# Patient Record
Sex: Female | Born: 1935 | Race: White | Hispanic: No | Smoking: Never smoker
Health system: Southern US, Community
[De-identification: ages and names within clinical notes are randomized; demographics above are authoritative.]

## PROBLEM LIST (undated history)

## (undated) DIAGNOSIS — K219 Gastro-esophageal reflux disease without esophagitis: Secondary | ICD-10-CM

## (undated) DIAGNOSIS — R531 Weakness: Secondary | ICD-10-CM

## (undated) DIAGNOSIS — F329 Major depressive disorder, single episode, unspecified: Secondary | ICD-10-CM

## (undated) DIAGNOSIS — R251 Tremor, unspecified: Secondary | ICD-10-CM

## (undated) DIAGNOSIS — F0390 Unspecified dementia without behavioral disturbance: Secondary | ICD-10-CM

## (undated) DIAGNOSIS — M1990 Unspecified osteoarthritis, unspecified site: Secondary | ICD-10-CM

## (undated) DIAGNOSIS — E039 Hypothyroidism, unspecified: Secondary | ICD-10-CM

## (undated) DIAGNOSIS — F32A Depression, unspecified: Secondary | ICD-10-CM

## (undated) DIAGNOSIS — R5383 Other fatigue: Secondary | ICD-10-CM

## (undated) DIAGNOSIS — E785 Hyperlipidemia, unspecified: Secondary | ICD-10-CM

## (undated) DIAGNOSIS — I10 Essential (primary) hypertension: Secondary | ICD-10-CM

## (undated) DIAGNOSIS — S7291XA Unspecified fracture of right femur, initial encounter for closed fracture: Secondary | ICD-10-CM

## (undated) HISTORY — PX: CHOLECYSTECTOMY: SHX55

## (undated) HISTORY — PX: THYROIDECTOMY, PARTIAL: SHX18

## (undated) HISTORY — PX: ABDOMINAL HYSTERECTOMY: SHX81

## (undated) HISTORY — PX: APPENDECTOMY: SHX54

---

## 2011-03-20 ENCOUNTER — Ambulatory Visit: Payer: Self-pay | Admitting: Cardiology

## 2012-10-21 ENCOUNTER — Emergency Department: Payer: Self-pay | Admitting: Emergency Medicine

## 2012-10-21 LAB — TSH: Thyroid Stimulating Horm: 6.01 u[IU]/mL — ABNORMAL HIGH

## 2012-10-21 LAB — COMPREHENSIVE METABOLIC PANEL
Albumin: 3.8 g/dL (ref 3.4–5.0)
Alkaline Phosphatase: 104 U/L (ref 50–136)
Anion Gap: 7 (ref 7–16)
BUN: 17 mg/dL (ref 7–18)
Calcium, Total: 8.6 mg/dL (ref 8.5–10.1)
Chloride: 104 mmol/L (ref 98–107)
Co2: 27 mmol/L (ref 21–32)
Creatinine: 0.71 mg/dL (ref 0.60–1.30)
EGFR (African American): >60
EGFR (Non-African Amer.): >60
Glucose: 78 mg/dL (ref 65–99)
Osmolality: 276 (ref 275–301)
SGOT(AST): 54 U/L — ABNORMAL HIGH (ref 15–37)
SGPT (ALT): 36 U/L (ref 12–78)

## 2012-10-21 LAB — URINALYSIS, COMPLETE
Bacteria: NONE SEEN
Bilirubin,UR: NEGATIVE
Glucose,UR: NEGATIVE mg/dL (ref 0–75)
Hyaline Cast: 3
Ketone: NEGATIVE
Leukocyte Esterase: NEGATIVE
Protein: NEGATIVE
Specific Gravity: 1.011 (ref 1.003–1.030)
WBC UR: NONE SEEN /HPF (ref 0–5)

## 2012-10-21 LAB — CBC
HCT: 43.8 % (ref 35.0–47.0)
HGB: 14.8 g/dL (ref 12.0–16.0)
MCH: 30.9 pg (ref 26.0–34.0)
Platelet: 183 10*3/uL (ref 150–440)
RDW: 15.1 % — ABNORMAL HIGH (ref 11.5–14.5)
WBC: 7.5 10*3/uL (ref 3.6–11.0)

## 2012-10-21 LAB — CK TOTAL AND CKMB (NOT AT ARMC)
CK, Total: 121 U/L (ref 21–215)
CK-MB: <0.5 ng/mL — ABNORMAL LOW (ref 0.5–3.6)

## 2013-08-11 ENCOUNTER — Observation Stay: Payer: Self-pay | Admitting: Family Medicine

## 2013-08-11 LAB — URINALYSIS, COMPLETE
BILIRUBIN, UR: NEGATIVE
Bacteria: NONE SEEN
Glucose,UR: NEGATIVE mg/dL (ref 0–75)
Hyaline Cast: 1
KETONE: NEGATIVE
Leukocyte Esterase: NEGATIVE
Nitrite: NEGATIVE
Ph: 6 (ref 4.5–8.0)
Protein: NEGATIVE
RBC,UR: 87 /HPF (ref 0–5)
SQUAMOUS EPITHELIAL: NONE SEEN
Specific Gravity: 1.014 (ref 1.003–1.030)
WBC UR: 4 /HPF (ref 0–5)

## 2013-08-11 LAB — BASIC METABOLIC PANEL
ANION GAP: 5 — AB (ref 7–16)
BUN: 16 mg/dL (ref 7–18)
CALCIUM: 8.9 mg/dL (ref 8.5–10.1)
CO2: 32 mmol/L (ref 21–32)
Chloride: 99 mmol/L (ref 98–107)
Creatinine: 1.07 mg/dL (ref 0.60–1.30)
EGFR (African American): 57 — ABNORMAL LOW
EGFR (Non-African Amer.): 49 — ABNORMAL LOW
GLUCOSE: 95 mg/dL (ref 65–99)
Osmolality: 273 (ref 275–301)
Potassium: 3.6 mmol/L (ref 3.5–5.1)
SODIUM: 136 mmol/L (ref 136–145)

## 2013-08-11 LAB — CBC
HCT: 44.7 %
HGB: 15.3 g/dL
MCH: 31.6 pg
MCHC: 34.3 g/dL
MCV: 92 fL
Platelet: 227 x10 3/mm 3
RBC: 4.84 X10 6/mm 3
RDW: 14.2 %
WBC: 7.2 x10 3/mm 3

## 2013-08-12 LAB — CBC WITH DIFFERENTIAL/PLATELET
BASOS ABS: 0.1 10*3/uL (ref 0.0–0.1)
Basophil %: 1 %
Eosinophil #: 0.2 10*3/uL (ref 0.0–0.7)
Eosinophil %: 2.5 %
HCT: 39.6 % (ref 35.0–47.0)
HGB: 13.6 g/dL (ref 12.0–16.0)
Lymphocyte #: 2 10*3/uL (ref 1.0–3.6)
Lymphocyte %: 28.2 %
MCH: 31.9 pg (ref 26.0–34.0)
MCHC: 34.3 g/dL (ref 32.0–36.0)
MCV: 93 fL (ref 80–100)
MONOS PCT: 7.5 %
Monocyte #: 0.5 x10 3/mm (ref 0.2–0.9)
NEUTROS ABS: 4.4 10*3/uL (ref 1.4–6.5)
NEUTROS PCT: 60.8 %
Platelet: 206 10*3/uL (ref 150–440)
RBC: 4.26 10*6/uL (ref 3.80–5.20)
RDW: 14.4 % (ref 11.5–14.5)
WBC: 7.2 10*3/uL (ref 3.6–11.0)

## 2013-08-12 LAB — BASIC METABOLIC PANEL
ANION GAP: 5 — AB (ref 7–16)
BUN: 13 mg/dL (ref 7–18)
Calcium, Total: 8.5 mg/dL (ref 8.5–10.1)
Chloride: 102 mmol/L (ref 98–107)
Co2: 30 mmol/L (ref 21–32)
Creatinine: 1.05 mg/dL (ref 0.60–1.30)
EGFR (Non-African Amer.): 50 — ABNORMAL LOW
GFR CALC AF AMER: 58 — AB
GLUCOSE: 87 mg/dL (ref 65–99)
Osmolality: 273 (ref 275–301)
Potassium: 3.3 mmol/L — ABNORMAL LOW (ref 3.5–5.1)
SODIUM: 137 mmol/L (ref 136–145)

## 2013-08-12 LAB — URINALYSIS, COMPLETE
BILIRUBIN, UR: NEGATIVE
Bacteria: NONE SEEN
Glucose,UR: NEGATIVE mg/dL (ref 0–75)
KETONE: NEGATIVE
NITRITE: NEGATIVE
Ph: 6 (ref 4.5–8.0)
Protein: NEGATIVE
RBC,UR: 4 /HPF (ref 0–5)
SPECIFIC GRAVITY: 1.008 (ref 1.003–1.030)
Squamous Epithelial: NONE SEEN
WBC UR: 32 /HPF (ref 0–5)

## 2013-08-12 LAB — SEDIMENTATION RATE: Erythrocyte Sed Rate: 7 mm/hr (ref 0–30)

## 2013-08-12 LAB — CK: CK, Total: 38 U/L (ref 21–215)

## 2013-08-12 LAB — LACTATE DEHYDROGENASE: LDH: 147 U/L (ref 81–246)

## 2013-08-12 LAB — TSH: Thyroid Stimulating Horm: 6.48 u[IU]/mL — ABNORMAL HIGH

## 2013-08-13 LAB — BASIC METABOLIC PANEL
ANION GAP: 3 — AB (ref 7–16)
BUN: 11 mg/dL (ref 7–18)
CHLORIDE: 104 mmol/L (ref 98–107)
CO2: 29 mmol/L (ref 21–32)
Calcium, Total: 7.7 mg/dL — ABNORMAL LOW (ref 8.5–10.1)
Creatinine: 0.89 mg/dL (ref 0.60–1.30)
EGFR (Non-African Amer.): >60
Glucose: 89 mg/dL (ref 65–99)
Osmolality: 271 (ref 275–301)
Potassium: 3.7 mmol/L (ref 3.5–5.1)
SODIUM: 136 mmol/L (ref 136–145)

## 2013-08-15 LAB — BASIC METABOLIC PANEL
ANION GAP: 4 — AB (ref 7–16)
BUN: 5 mg/dL — ABNORMAL LOW (ref 7–18)
CALCIUM: 7.8 mg/dL — AB (ref 8.5–10.1)
CO2: 27 mmol/L (ref 21–32)
Chloride: 111 mmol/L — ABNORMAL HIGH (ref 98–107)
Creatinine: 0.82 mg/dL (ref 0.60–1.30)
GLUCOSE: 84 mg/dL (ref 65–99)
OSMOLALITY: 280 (ref 275–301)
Potassium: 3.6 mmol/L (ref 3.5–5.1)
SODIUM: 142 mmol/L (ref 136–145)

## 2013-08-15 LAB — CBC WITH DIFFERENTIAL/PLATELET
BASOS ABS: 0.1 10*3/uL (ref 0.0–0.1)
Basophil %: 1 %
Eosinophil #: 0.2 10*3/uL (ref 0.0–0.7)
Eosinophil %: 3.4 %
HCT: 33.7 % — ABNORMAL LOW (ref 35.0–47.0)
HGB: 11.7 g/dL — AB (ref 12.0–16.0)
LYMPHS ABS: 1.8 10*3/uL (ref 1.0–3.6)
LYMPHS PCT: 32.7 %
MCH: 31.9 pg (ref 26.0–34.0)
MCHC: 34.7 g/dL (ref 32.0–36.0)
MCV: 92 fL (ref 80–100)
MONO ABS: 0.4 x10 3/mm (ref 0.2–0.9)
MONOS PCT: 7.7 %
Neutrophil #: 3.1 10*3/uL (ref 1.4–6.5)
Neutrophil %: 55.2 %
Platelet: 178 10*3/uL (ref 150–440)
RBC: 3.65 10*6/uL — ABNORMAL LOW (ref 3.80–5.20)
RDW: 14.5 % (ref 11.5–14.5)
WBC: 5.6 10*3/uL (ref 3.6–11.0)

## 2013-08-19 LAB — URINE CULTURE

## 2013-09-28 DIAGNOSIS — I10 Essential (primary) hypertension: Secondary | ICD-10-CM | POA: Diagnosis present

## 2013-09-28 DIAGNOSIS — M109 Gout, unspecified: Secondary | ICD-10-CM | POA: Diagnosis present

## 2013-09-28 DIAGNOSIS — E039 Hypothyroidism, unspecified: Secondary | ICD-10-CM | POA: Diagnosis present

## 2013-09-28 DIAGNOSIS — E785 Hyperlipidemia, unspecified: Secondary | ICD-10-CM | POA: Insufficient documentation

## 2014-11-03 NOTE — Discharge Summary (Signed)
PATIENT NAME:  Brandy Oconnor, Brandy Oconnor MR#:  [id number] DATE OF BIRTH:  1930-11-05  DATE OF ADMISSION:  08/11/2013 DATE OF DISCHARGE:  08/15/2013  DISCHARGE DIAGNOSES: 1.  Lower extremity weakness.  2.  Dehydration.  3.  Urinary tract infection.   DISCHARGE MEDICATIONS: 1.  Atenolol 100 mg p.o. daily.  2.  Amlodipine 5 mg p.o. daily.  3.  Alendronate 70 mg 1 tab weekly.  4.  Hydrochlorothiazide/lisinopril 12.5/20 mg p.o. daily.  5.  Pravastatin 20 mg p.o. at bedtime.  6.  Primidone 250 mg p.o. t.i.d.  7.  Levothyroxine 88 mcg p.o. daily.  8.  Ciprofloxacin 250 mg p.o. b.i.d. x 2 more days.   CONSULTS: None.   PROCEDURES: None.   PERTINENT LABS ON DAY OF DISCHARGE: Sodium 142, potassium 3.6, creatinine 0.82, glucose 84. White blood cell count 5.6, hemoglobin 11.7, platelets of 178. Urine culture is pending.   BRIEF HOSPITAL COURSE:  1.  Lower extremity weakness. The patient initially came in with complaints of lower extremity weakness with unclear source. After further evaluation, it is likely thought that this was due to deconditioning and progressive weakness. She had an MRI of the spine performed. It did show mild lumbar degenerative changes. No neural impingement or spinal stenosis. She was evaluated by physical therapy who recommended home health PT. She was able to ambulate prior to discharge. Plan to discharge home with continued physical therapy. 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 Further evaluation can be done as an outpatient.  2.  Dehydration. The patient initially came in with concerns for dehydration. She was given IV fluids and this did resolve.  3.  Urinary tract infection. The patient had a positive urinalysis upon admission. Urine culture is still pending at this time. Her initial urinalysis showed 2+ blood, 3+ leukocyte esterase and 32 white blood cells. She is on Cipro. She has 2 more days of Cipro.   DISPOSITION: She is in stable  condition to be discharged home with home health. Will follow up with Dr. Burnadette PopLinthavong within 10 days.    ____________________________ Marisue IvanKanhka Jeris Roser, MD kl:dp D: 08/15/2013 08:22:54 ET T: 08/15/2013 10:12:02 ET JOB#: [id number]  cc: Marisue IvanKanhka Lavere Shinsky, MD, <Dictator> Marisue IvanKANHKA Ogle Hoeffner MD ELECTRONICALLY SIGNED 09/01/2013 10:51

## 2014-11-03 NOTE — H&P (Signed)
PATIENT NAME:  Brandy Oconnor, Brandy Oconnor MR#:  [id number] DATE OF BIRTH:  05-Jun-1931  DATE OF ADMISSION:  08/11/2013  ADMITTING PHYSICIAN:  Enid Baas, MD  PRIMARY CARE PHYSICIAN:  Dr. Burnadette Pop   CHIEF COMPLAINT:  Weakness and fall today.   HISTORY OF PRESENT ILLNESS:  Brandy Oconnor is an 81 year old Caucasian female with past medical history significant for hypertension, hypothyroidism, central tremors, who is at [facility], was brought in after she was suffering from bilateral leg weakness and had a fall today. The patient says that she has been having weakness of her legs for almost the past year. At baseline, she does not use any careful inspection or walker to ambulate, but had been scared to walk for long distances because her gait was getting more unsteady lately. She does have central tremors with tremors of her arms and also noticed that her tremors of the lower legs getting worse lately. She recently was talking to the people at the independent living facility to see if she could get a walker. Today, she was walking and in her legs gave out and she had a fall and was presented to the Emergency Room. She is extremely weak and unable to get out of bed here, so is being admitted under observation for PT evaluation.   PAST MEDICAL HISTORY:  1.  Hypertension.  2.  Hypothyroidism.  3.  Central tremors.  4.  Osteoporosis.   PAST SURGICAL HISTORY:  1.  Hysterectomy.  2.  Cholecystectomy.  3.  Appendectomy.  4.  Subtotal thyroidectomy for goiter.   ALLERGIES:  CODEINE.    CURRENT HOME MEDICATIONS:  1.  Norvasc 5 mg p.o. daily.  2.  Atenolol 100 mg p.o. daily.  3.  Fosamax 70 mg p.o. once a week.  4.  Lisinopril/hydrochlorothiazide 20/12.5 mg 1 tablet p.o. daily.  5.  Pravastatin 20 mg p.o. daily.  6.  Primidone 250 mg p.o. 3 times a day.  7.  Synthroid 88 mcg 1 tablet p.o. daily.   SOCIAL HISTORY:  Living at [facility]. Does  not have a cane or walker at baseline but has been growing unsteady to ambulate. No history of any smoking ever. No alcohol use.  FAMILY HISTORY:  Significant for heart disease in the family.   REVIEW OF SYSTEMS:  CONSTITUTIONAL:  No fever, fatigue or weakness.  EYES:  No blurred vision, double vision, pain, inflammation or glaucoma. Uses glasses.  ENT:  No tinnitus, ear pain, hearing loss, epistaxis or discharge.  RESPIRATORY:  No cough, wheeze, hemoptysis or COPD.  CARDIOVASCULAR:  No chest pain, orthopnea, edema, arrhythmia, palpitations or syncope.  GASTROINTESTINAL:  No nausea, vomiting, diarrhea, abdominal pain, hematemesis or melena.  GENITOURINARY:  No dysuria, hematuria, renal calculus, frequency or incontinence.  ENDOCRINE:  No polyuria, nocturia, thyroid problems, heat or cold intolerance.  HEMATOLOGY:  No anemia, easy bruising or bleeding.  SKIN:  No acne, rash or lesions.  MUSCULOSKELETAL:  Positive for arthritis and osteoporosis. No joint pains currently. No gout.  NEUROLOGIC:  No numbness, weakness, CVA, TIA or seizures. Positive for ataxia and central tremors.   PSYCHOLOGICAL:  No anxiety, insomnia or depression.   PHYSICAL EXAMINATION:  VITAL SIGNS:  Temperature 98.5 degrees Fahrenheit, pulse 73, respirations 18, blood pressure 149/83, pulse ox 100% on room air.  GENERAL:  Well-built, well-nourished female lying in bed, not in any acute distress.  HEENT:  Normocephalic, atraumatic. Pupils equal, round, reacting to light. Anicteric sclerae. Extraocular movements intact.  Oropharynx clear without erythema, mass or exudates.  NECK:  Supple. No thyromegaly, JVD or carotid bruits. No lymphadenopathy.  LUNGS:  Moving air bilaterally. No wheeze or crackles. No use of accessory muscles for breathing.  CARDIOVASCULAR:  S1, S2 regular rate and rhythm, 3/6 systolic murmur heard. No rubs or gallops.  ABDOMEN:  Soft, nontender, nondistended. No hepatosplenomegaly. Normal bowel sounds.   EXTREMITIES:  No pedal edema. No clubbing or cyanosis, 2+ dorsalis pedis pulses palpable bilaterally.  SKIN:  No acne, rash or lesions.  LYMPHATICS:  No cervical or inguinal lymphadenopathy. NEUROLOGICAL:  Cranial nerves II through XII remain intact. Motor strength is 5/5 both upper extremities, and 4/5 both lower extremities. Sensation is intact. She does central tremor involving her head and also her arms and extremities noted by independent lifting of the leg off the bed.  PSYCHOLOGIC:  Awake, alert, oriented x 3.   LABORATORY DATA:  WBC 7.2, hemoglobin 15.3, hematocrit 44.7, platelet count 227.   Sodium 136, potassium 3.8, chloride 99, bicarb 32, BUN 16, creatinine 1.07, glucose 99 and calcium of 8.9.   URINALYSIS:  Negative for any infection.   CT of the head showing age-related atrophy which is mild and generalized, otherwise no acute intracranial findings noted. An EKG showing normal sinus rhythm, heart rate of 76, no acute ST-T wave abnormalities noted.   ASSESSMENT AND PLAN:  This is an elderly 81 year old female with a history of central tremors, hypertension, who is brought from independent living after she had a fall. 1.  Fall and unsteady gait, likely worsening of her central tremors. Continue primidone at the current dose. We will get Physical Therapy to see if she could get a walker and Home Health.  2.  Hypertension. Continue home medications.  3.  Hypothyroidism.  Continue Synthroid.  4.  Deep venous thrombosis prophylaxis with subcutaneous heparin.   CODE STATUS:  She is a DO NOT RESUSCITATE as she has an out-of-facility yellow DRN form signed by Dr. Burnadette PopLinthavong from 12/30/2012.    TIME SPENT ON ADMISSION:  50 minutes.   ____________________________ Enid Baasadhika Gloriana Piltz, MD rk:jm D: 08/11/2013 14:23:00 ET T: 08/11/2013 15:15:13 ET JOB#: [id number]  cc: Enid Baasadhika Asyia Hornung, MD, <Dictator> Marisue IvanKanhka Linthavong, MD Enid BaasADHIKA Lenzy Kerschner MD ELECTRONICALLY SIGNED 08/17/2013 14:58

## 2014-11-08 ENCOUNTER — Observation Stay
Admit: 2014-11-08 | Disposition: A | Discharge status: Home / Self Care | Payer: Self-pay | Attending: Internal Medicine | Admitting: Internal Medicine

## 2014-11-08 LAB — CBC WITH DIFFERENTIAL/PLATELET
BASOS ABS: 0.1 10*3/uL (ref 0.0–0.1)
Basophil %: 0.6 %
Eosinophil #: 0.7 10*3/uL (ref 0.0–0.7)
Eosinophil %: 7.4 %
HCT: 41.2 % (ref 35.0–47.0)
HGB: 13.7 g/dL (ref 12.0–16.0)
LYMPHS ABS: 3.6 10*3/uL (ref 1.0–3.6)
LYMPHS PCT: 40.3 %
MCH: 30.1 pg (ref 26.0–34.0)
MCHC: 33.2 g/dL (ref 32.0–36.0)
MCV: 91 fL (ref 80–100)
MONO ABS: 0.3 x10 3/mm (ref 0.2–0.9)
Monocyte %: 3.5 %
Neutrophil #: 4.3 10*3/uL (ref 1.4–6.5)
Neutrophil %: 48.2 %
Platelet: 221 10*3/uL (ref 150–440)
RBC: 4.54 10*6/uL (ref 3.80–5.20)
RDW: 14.5 % (ref 11.5–14.5)
WBC: 9 10*3/uL (ref 3.6–11.0)

## 2014-11-08 LAB — URINALYSIS, COMPLETE
Bacteria: NONE SEEN
Bilirubin,UR: NEGATIVE
Glucose,UR: NEGATIVE mg/dL (ref 0–75)
Ketone: NEGATIVE
Leukocyte Esterase: NEGATIVE
NITRITE: NEGATIVE
Ph: 7 (ref 4.5–8.0)
Protein: 100
Specific Gravity: 1.009 (ref 1.003–1.030)

## 2014-11-08 LAB — BASIC METABOLIC PANEL
Anion Gap: 10 (ref 7–16)
BUN: 22 mg/dL — AB
CO2: 23 mmol/L
CREATININE: 1.13 mg/dL — AB
Calcium, Total: 8.2 mg/dL — ABNORMAL LOW
Chloride: 106 mmol/L
GFR CALC AF AMER: 52 — AB
GFR CALC NON AF AMER: 45 — AB
GLUCOSE: 132 mg/dL — AB
POTASSIUM: 3.6 mmol/L
SODIUM: 139 mmol/L

## 2014-11-08 LAB — CLOSTRIDIUM DIFFICILE(ARMC)

## 2014-11-09 ENCOUNTER — Other Ambulatory Visit: Payer: Self-pay

## 2014-11-09 LAB — CBC WITH DIFFERENTIAL/PLATELET
BASOS ABS: 0 10*3/uL (ref 0.0–0.1)
BASOS PCT: 0.3 %
Eosinophil #: 0.6 10*3/uL (ref 0.0–0.7)
Eosinophil %: 7.8 %
HCT: 32.5 % — AB (ref 35.0–47.0)
HGB: 10.8 g/dL — AB (ref 12.0–16.0)
LYMPHS PCT: 20.9 %
Lymphocyte #: 1.6 10*3/uL (ref 1.0–3.6)
MCH: 30.3 pg (ref 26.0–34.0)
MCHC: 33.3 g/dL (ref 32.0–36.0)
MCV: 91 fL (ref 80–100)
Monocyte #: 0.5 x10 3/mm (ref 0.2–0.9)
Monocyte %: 5.9 %
NEUTROS ABS: 5 10*3/uL (ref 1.4–6.5)
Neutrophil %: 65.1 %
Platelet: 138 10*3/uL — ABNORMAL LOW (ref 150–440)
RBC: 3.57 10*6/uL — ABNORMAL LOW (ref 3.80–5.20)
RDW: 14.9 % — ABNORMAL HIGH (ref 11.5–14.5)
WBC: 7.7 10*3/uL (ref 3.6–11.0)

## 2014-11-09 LAB — BASIC METABOLIC PANEL
Anion Gap: 4 — ABNORMAL LOW (ref 7–16)
BUN: 10 mg/dL
Calcium, Total: 7.6 mg/dL — ABNORMAL LOW
Chloride: 116 mmol/L — ABNORMAL HIGH
Co2: 24 mmol/L
Creatinine: 0.71 mg/dL
EGFR (African American): >60
GLUCOSE: 96 mg/dL
Potassium: 3.5 mmol/L
SODIUM: 144 mmol/L

## 2014-11-09 LAB — TSH: THYROID STIMULATING HORM: 1.638 u[IU]/mL

## 2014-11-11 NOTE — H&P (Signed)
PATIENT NAME:  Brandy Oconnor, ROMA MR#:  [id number] DATE OF BIRTH:  [birth year]  DATE OF ADMISSION:  11/08/2014  CHIEF COMPLAINT: Bradycardia.   HISTORY OF PRESENT ILLNESS: This is an 81 year old female who came to the ED after a syncopal episode. She states that she began to have nausea and some diarrhea about a week ago. The diarrhea was pretty persistent, every day, several times a day and sometimes it was very watery. She states that today she went into the bathroom, was using the bathroom, and then got really sweaty and had an episode of light headedness. She was able to get down to the floor and then afterwards had a syncopal episode while on the floor. When she came to, she was able to press her Life Alert button to call EMS to bring her into the ED. She stated that she was able to get on the floor before she syncopized; there was no fall. The patient was recently on some antibiotics for a sinus infection. She finished them about a week ago. Since she has been having this nausea she had decreased p.o. intake. The patient denies any overt fevers or chills. She has had diffuse abdominal tenderness. Denies any other significant infectious symptoms. Denies any urinary symptoms. In the ED, the patient was found to be dehydrated with low blood pressure. She was given fluid bolus and her blood pressure responded pretty well. Hospitalists were then called for admission for AKI and suspected Clostridium difficile infection.   PRIMARY CARE PHYSICIAN: Marisue Ivan, MD   PAST MEDICAL HISTORY: Hypothyroidism postsurgical tremor, hypertension, hyperlipidemia.   MEDICATIONS: Vitamin D; pravastatin 20 mg daily; Mysoline 500 mg in the morning and in the evening, 250 mg in the afternoon; Claritin; lisinopril 10 mg daily; Synthroid 100 mg daily; atenolol 50 mg daily.   PAST SURGICAL HISTORY: Includes cholecystectomy, appendectomy, hysterectomy, and thyroid resection for a tumor.  ALLERGIES: CODEINE.   FAMILY  HISTORY: Hypertension, CAD, diabetes, cancer.   SOCIAL HISTORY: Nonsmoker, nondrinker. Denies illicit drug use.   REVIEW OF SYSTEMS:  CONSTITUTIONAL: Denies fever. Endorses some fatigue and weakness.  EYES: Denies blurred or double vision, pain or redness.  EAR, NOSE, AND THROAT: Denies ear pain, hearing loss, difficulty swallowing.  RESPIRATORY: Denies cough, dyspnea, painful respiration.  CARDIOVASCULAR: Denies chest pain, edema, or palpitations. She endorses a syncopal episode as per HPI.  GASTROINTESTINAL: Endorses nausea with diarrhea. No abdominal pain. Denies vomiting. Denies constipation.  GENITOURINARY: Denies dysuria, hematuria, or frequency.  ENDOCRINE: Denies nocturia. Endorses chronic stable thyroid problems. Denies heat or cold intolerance.  HEMATOLOGIC AND LYMPHATIC: Denies easy bruising, bleeding, swollen glands.  INTEGUMENTARY: Denies acne, rash, or lesion.  MUSCULOSKELETAL: Denies arthritis, joint swelling, or gout.  NEUROLOGICAL: Denies numbness, weakness, headache.  PSYCHIATRIC: Denies anxiety, insomnia, or depression.   PHYSICAL EXAMINATION:  VITAL SIGNS: Blood pressure 95/62, pulse 63, temperature 97.5, respirations 20 with 100% oxygen saturation on 2 L of oxygen.  GENERAL: This is an elderly-appearing female lying in bed in no acute distress.  HEENT: Pupils equal, round, and react to light and accommodation. Extraocular movements intact. No scleral icterus. Dry mucosal membranes.  NECK: Thyroid is not enlarged. Neck is supple. No masses, nontender. No cervical adenopathy. No JVD.  RESPIRATORY: Clear to auscultation bilaterally with no rales, rhonchi, or wheezing. No respiratory distress.  CARDIOVASCULAR: Regular rate and rhythm with no murmurs, rubs, or gallops. Good pedal pulses. No lower extremity edema.  ABDOMEN: Soft. Diffuse mild tenderness with bowel sounds present and no distention.  MUSCULOSKELETAL: Muscular strength 5/5 in all 4 extremities. Full spontaneous  range of motion throughout. No cyanosis or clubbing.  SKIN: No rash, lesions. Skin is warm, dry, and intact.  LYMPHATIC: No adenopathy.  NEUROLOGICAL: Cranial nerves intact. Sensation intact throughout. No dysarthria or aphasia.  PSYCHIATRIC: Alert and oriented x 3, cooperative, not confused or agitated.   LABORATORY DATA: White count 9, hemoglobin 13.7, hematocrit 41.2, platelets 221,000. Sodium 139, potassium 3.6, chloride 106, bicarbonate 23, BUN 22, creatinine 1.13, glucose 132, calcium 8.2. Urinalysis is negative.   RADIOLOGIC DATA: No current radiographic imaging to report for this admission.   ASSESSMENT AND PLAN:  1.  Syncope: This is likely due to her significant dehydration and her low blood pressure from the same due to her diarrhea. We will treat those problems as listed below and continue to monitor her on telemetry while here.  2.  Diarrhea: Question as to whether or not this is a Clostridium difficile infection. She does white count. She does have a little bit of acute kidney injury, which could be due to her dehydration. We have ordered to check for Clostridium difficile. We will hold her Mysoline for right now as well just because that can cause nausea and vomiting as a side effect.  3.  Acute kidney injury: This is likely due to her dehydration from her diarrhea. We will give her fluids for resuscitation. Her blood pressure is responding well to this. We will monitor her creatinine.  4.  Hypertension: We will hold her home antihypertensives at this time, as she has borderline low blood pressure.  5.  Hypothyroidism: Continue her home dose of Synthroid.  6.  Deep vein thrombosis prophylaxis: Subcutaneous heparin.   CODE STATUS: This patient is full code. Per her report, she has a DO NOT RESUSCITATE at home; however, she stated to this writer tonight that she wanted to be resuscitated if there was "a chance to revive me." This code status may need to be further explored with her  primary care physician once she is discharged from the hospital.   TIME SPENT ON THIS ADMISSION: 45 minutes.   ____________________________ Candace Cruiseavid F. Anne HahnWillis, MD dfw:bm D: 11/08/2014 04:01:11 ET T: 11/08/2014 06:33:45 ET JOB#: [id number]  cc: Candace Cruiseavid F. Anne HahnWillis, MD, <Dictator> London Tarnowski Scotty CourtF Jeter Tomey MD ELECTRONICALLY SIGNED 11/08/2014 21:03

## 2014-11-11 NOTE — Discharge Summary (Signed)
PATIENT NAME:  Brandy Oconnor, Brandy Oconnor MR#:  [id number] DATE OF BIRTH:  08-12-1930  DATE OF ADMISSION:  11/08/2014 DATE OF DISCHARGE:  11/09/2014   DISCHARGE DIAGNOSES: 1. Acute gastroenteritis.  2. Acute and renal failure with dehydration.  3. Hypertension.  4. Hypothyroidism.   CONDITION: Stable.   CODE STATUS: Full code.   HOME MEDICATIONS: Please refer to the medication reconciliation list.    DIET: Low-sodium diet.   ACTIVITY: As tolerated.   FOLLOWUP CARE: Follow with PCP within 1-2 weeks. The patient needs to resume home health with [facility].   REASON FOR ADMISSION: Bradycardia.   HOSPITAL COURSE:  1. The patient is an 81 year old Caucasian female with a history of hypothyroidism, tremor, and hypertension, who was sent to the Emergency Department due to nausea and diarrhea for 1 week. The patient's diarrhea was persistent, several times a day. In addition, the patient had 1 episode of lightheadedness. For a detailed history and physical examination, please refer to the admission note dictated by Dr. Anne HahnWillis. On admission date, the patient's BUN 22, creatinine 1.13, glucose 132. Urinalysis is negative. WBC 9, hemoglobin 13.7. The patient was admitted for syncope, possibly due to dehydration and low blood pressure due to diarrhea.  2. Acute gastroenteritis. The patient has been treated with IV fluid support Zofran p.r.n. Stool Clostridium difficile test is negative. The patient's diarrhea resolved. She has no nausea or vomiting.  3. Acute renal failure with dehydration. As mentioned above, the patient received IV fluid support. Dehydration and renal failure improved.  4. Hypertension. The patient's blood pressure was low. Lisinopril and atenolol were discontinued. The patient's blood pressure is stable. The patient may resume lisinopril and atenolol after discharge.  5. The patient has no complaints. Vital signs are stable, physical examination is unremarkable. She will be discharged to home and  resume the [facility] home health. I discussed the patient's discharge plan with the patient, nurse, and the case manager.   TIME SPENT: About 36 minutes     ____________________________ Shaune PollackQing Jacqueli Pangallo, MD qc:mw D: 11/09/2014 11:16:28 ET T: 11/09/2014 11:30:02 ET JOB#: [id number]  cc: Shaune PollackQing Khaza Blansett, MD, <Dictator> Shaune PollackQING Anacristina Steffek MD ELECTRONICALLY SIGNED 11/09/2014 13:36

## 2015-05-08 ENCOUNTER — Encounter: Payer: Self-pay | Admitting: *Deleted

## 2015-05-08 ENCOUNTER — Emergency Department
Admission: EM | Admit: 2015-05-08 | Discharge: 2015-05-08 | Disposition: A | Discharge status: Home / Self Care | Payer: Medicare (Managed Care) | Attending: Emergency Medicine | Admitting: Emergency Medicine

## 2015-05-08 DIAGNOSIS — W4904XA Ring or other jewelry causing external constriction, initial encounter: Secondary | ICD-10-CM | POA: Insufficient documentation

## 2015-05-08 DIAGNOSIS — I10 Essential (primary) hypertension: Secondary | ICD-10-CM | POA: Diagnosis not present

## 2015-05-08 DIAGNOSIS — Y9389 Activity, other specified: Secondary | ICD-10-CM | POA: Diagnosis not present

## 2015-05-08 DIAGNOSIS — Z79899 Other long term (current) drug therapy: Secondary | ICD-10-CM | POA: Diagnosis not present

## 2015-05-08 DIAGNOSIS — S6991XA Unspecified injury of right wrist, hand and finger(s), initial encounter: Secondary | ICD-10-CM | POA: Diagnosis not present

## 2015-05-08 DIAGNOSIS — Y998 Other external cause status: Secondary | ICD-10-CM | POA: Diagnosis not present

## 2015-05-08 DIAGNOSIS — Y9289 Other specified places as the place of occurrence of the external cause: Secondary | ICD-10-CM | POA: Insufficient documentation

## 2015-05-08 HISTORY — DX: Essential (primary) hypertension: I10

## 2015-05-08 HISTORY — DX: Gastro-esophageal reflux disease without esophagitis: K21.9

## 2015-05-08 HISTORY — DX: Hypothyroidism, unspecified: E03.9

## 2015-05-08 HISTORY — DX: Unspecified dementia, unspecified severity, without behavioral disturbance, psychotic disturbance, mood disturbance, and anxiety: F03.90

## 2015-05-08 HISTORY — DX: Unspecified osteoarthritis, unspecified site: M19.90

## 2015-05-08 HISTORY — DX: Major depressive disorder, single episode, unspecified: F32.9

## 2015-05-08 HISTORY — DX: Depression, unspecified: F32.A

## 2015-05-08 NOTE — ED Notes (Signed)
Pt here requesting a ring be cut off of her finger.  Finger swollen and painful.

## 2015-05-08 NOTE — Discharge Instructions (Signed)
Return if condition worsen.

## 2015-05-08 NOTE — ED Provider Notes (Signed)
[facility] Emergency Department Provider Note ____________________________________________  Time seen: Approximately 1:37 PM  I have reviewed the triage vital signs and the nursing notes.   HISTORY  Chief Complaint Hand Pain  HPI Brandy Oconnor is a 81 y.o. female who presents requesting that she have her a ring cut off her R ring finger. States her finger is now causing her pain and her knuckle is swollen.  Past Medical History  Diagnosis Date  . Dementia   . Depression   . Osteoarthritis   . Hypothyroidism   . Hypertension   . GERD (gastroesophageal reflux disease)     There are no active problems to display for this patient.   History reviewed. No pertinent past surgical history.  Current Outpatient Rx  Name  Route  Sig  Dispense  Refill  . atenolol (TENORMIN) 50 MG tablet   Oral   Take 50 mg by mouth daily.         . Levothyroxine Sodium 100 MCG CAPS   Oral   Take 100 mcg by mouth daily.         Marland Kitchen lisinopril (PRINIVIL,ZESTRIL) 10 MG tablet   Oral   Take 10 mg by mouth daily.         Marland Kitchen loratadine (CLARITIN) 10 MG tablet   Oral   Take 10 mg by mouth daily.         . pravastatin (PRAVACHOL) 20 MG tablet   Oral   Take 20 mg by mouth at bedtime.         . primidone (MYSOLINE) 250 MG tablet   Oral   Take 250 mg by mouth 3 (three) times daily. At 8am, 1pm, and bedtime         . Vitamin D, Ergocalciferol, (DRISDOL) 50000 UNITS CAPS capsule   Oral   Take 50,000 Units by mouth every 30 (thirty) days. Once a month on the first Monday           Allergies Codeine  No family history on file.  Social History Social History  Substance Use Topics  . Smoking status: Never Smoker   . Smokeless tobacco: None  . Alcohol Use: No    Review of Systems Musculoskeletal: R ring finger pain with swelling of knuckle.  Skin: Negative for bruising around rings.  10-point ROS otherwise  negative.  ____________________________________________   PHYSICAL EXAM:  VITAL SIGNS: ED Triage Vitals  Enc Vitals Group     BP 05/08/15 1239 157/79 mmHg     Pulse Rate 05/08/15 1239 63     Resp 05/08/15 1239 18     Temp 05/08/15 1239 98.2 F (36.8 C)     Temp Source 05/08/15 1239 Oral     SpO2 05/08/15 1239 95 %     Weight --      Height --      Head Cir --      Peak Flow --      Pain Score 05/08/15 1230 2     Pain Loc --      Pain Edu? --      Excl. in GC? --    Constitutional: Alert and oriented. Well appearing and in no acute distress. Eyes: Conjunctivae are normal. Head: Atraumatic. Mouth/Throat: Mucous membranes are moist.  Cardiovascular: Normal rate, regular rhythm. Good peripheral circulation. Respiratory: Normal respiratory effort.  No retractions.  Musculoskeletal: Swelling of PIP joint in R ringer finger. Mild underlying flushing of the skin. No ecchymosis or bleeding from  area.  Neurologic: Baseline head and extremity tremors. Normal speech and language. Skin:  Skin is warm, dry and intact. No rash noted. Psychiatric: Mood and affect are normal. Speech and behavior are baseline  PROCEDURES  Procedure(s) performed: Removal of two rings on R ring finger. one using manual ring cutter and the other was removed using lubricant and sliding it over PIP joint.   Critical Care performed: No  ____________________________________________   INITIAL IMPRESSION / ASSESSMENT AND PLAN / ED COURSE  Pertinent labs & imaging results that were available during my care of the patient were reviewed by me and considered in my medical decision making (see chart for details).  81 yo F who presents requesting to have a ring on her R ring finger removed. Significant swelling of the PIP joint was noted with no ecchymosis. The first ring was removed using the manual ring cutter. After removal inspection revealed to small abrasions of the skin at the PIP joint and significant  swelling of the joint. There was concern that if the other ring was left on the finger it would continue to swell and have to be cut off. With patient permission the second ring was then removed using lubricant and sliding it over the PIP joint. The finger was then re-inspected with no further damage noted and pt was neurovascularly intact. Return precautions provided and pt and her daughter verbalized understanding of them.  ____________________________________________   FINAL CLINICAL IMPRESSION(S) / ED DIAGNOSES  Final diagnoses:  Ring or other jewelry causing external constriction, initial encounter      Joni ReiningRonald K Smith, PA-C 05/08/15 1354  Jeanmarie PlantJames A McShane, MD 05/08/15 [phone]

## 2015-05-23 ENCOUNTER — Inpatient Hospital Stay
Admission: EM | Admit: 2015-05-23 | Discharge: 2015-05-27 | DRG: 470 | Disposition: A | Discharge status: Skilled Nursing Facility | Payer: Medicare (Managed Care) | Attending: Internal Medicine | Admitting: Internal Medicine

## 2015-05-23 ENCOUNTER — Emergency Department: Payer: Medicare (Managed Care)

## 2015-05-23 DIAGNOSIS — Y92009 Unspecified place in unspecified non-institutional (private) residence as the place of occurrence of the external cause: Secondary | ICD-10-CM | POA: Diagnosis not present

## 2015-05-23 DIAGNOSIS — K219 Gastro-esophageal reflux disease without esophagitis: Secondary | ICD-10-CM | POA: Diagnosis present

## 2015-05-23 DIAGNOSIS — Z833 Family history of diabetes mellitus: Secondary | ICD-10-CM

## 2015-05-23 DIAGNOSIS — W010XXA Fall on same level from slipping, tripping and stumbling without subsequent striking against object, initial encounter: Secondary | ICD-10-CM | POA: Diagnosis present

## 2015-05-23 DIAGNOSIS — E785 Hyperlipidemia, unspecified: Secondary | ICD-10-CM | POA: Diagnosis present

## 2015-05-23 DIAGNOSIS — E039 Hypothyroidism, unspecified: Secondary | ICD-10-CM | POA: Diagnosis present

## 2015-05-23 DIAGNOSIS — I10 Essential (primary) hypertension: Secondary | ICD-10-CM | POA: Diagnosis present

## 2015-05-23 DIAGNOSIS — S72001A Fracture of unspecified part of neck of right femur, initial encounter for closed fracture: Secondary | ICD-10-CM | POA: Diagnosis present

## 2015-05-23 DIAGNOSIS — Z885 Allergy status to narcotic agent status: Secondary | ICD-10-CM | POA: Diagnosis not present

## 2015-05-23 DIAGNOSIS — G250 Essential tremor: Secondary | ICD-10-CM | POA: Diagnosis present

## 2015-05-23 DIAGNOSIS — Z419 Encounter for procedure for purposes other than remedying health state, unspecified: Secondary | ICD-10-CM

## 2015-05-23 DIAGNOSIS — M1611 Unilateral primary osteoarthritis, right hip: Secondary | ICD-10-CM | POA: Diagnosis present

## 2015-05-23 DIAGNOSIS — Z79899 Other long term (current) drug therapy: Secondary | ICD-10-CM | POA: Diagnosis not present

## 2015-05-23 DIAGNOSIS — G3183 Dementia with Lewy bodies: Secondary | ICD-10-CM | POA: Diagnosis present

## 2015-05-23 DIAGNOSIS — N390 Urinary tract infection, site not specified: Secondary | ICD-10-CM | POA: Diagnosis present

## 2015-05-23 DIAGNOSIS — G8918 Other acute postprocedural pain: Secondary | ICD-10-CM

## 2015-05-23 DIAGNOSIS — S72009A Fracture of unspecified part of neck of unspecified femur, initial encounter for closed fracture: Secondary | ICD-10-CM | POA: Diagnosis present

## 2015-05-23 DIAGNOSIS — R509 Fever, unspecified: Secondary | ICD-10-CM

## 2015-05-23 HISTORY — DX: Hyperlipidemia, unspecified: E78.5

## 2015-05-23 HISTORY — DX: Tremor, unspecified: R25.1

## 2015-05-23 HISTORY — DX: Unspecified fracture of right femur, initial encounter for closed fracture: S72.91XA

## 2015-05-23 LAB — TYPE AND SCREEN
ABO/RH(D): A NEG
Antibody Screen: NEGATIVE

## 2015-05-23 LAB — URINALYSIS COMPLETE WITH MICROSCOPIC (ARMC ONLY)
Bilirubin Urine: NEGATIVE
Glucose, UA: NEGATIVE mg/dL
KETONES UR: NEGATIVE mg/dL
NITRITE: NEGATIVE
PH: 6 (ref 5.0–8.0)
Protein, ur: 100 mg/dL — AB
SPECIFIC GRAVITY, URINE: 1.015 (ref 1.005–1.030)
Squamous Epithelial / LPF: NONE SEEN

## 2015-05-23 LAB — CBC WITH DIFFERENTIAL/PLATELET
Basophils Absolute: 0 10*3/uL (ref 0–0.1)
Basophils Relative: 0 %
EOS PCT: 2 %
Eosinophils Absolute: 0.2 10*3/uL (ref 0–0.7)
HCT: 44.2 % (ref 35.0–47.0)
Hemoglobin: 15.1 g/dL (ref 12.0–16.0)
LYMPHS ABS: 2.1 10*3/uL (ref 1.0–3.6)
LYMPHS PCT: 20 %
MCH: 33 pg (ref 26.0–34.0)
MCHC: 34.2 g/dL (ref 32.0–36.0)
MCV: 96.7 fL (ref 80.0–100.0)
MONO ABS: 0.7 10*3/uL (ref 0.2–0.9)
Monocytes Relative: 7 %
NEUTROS ABS: 7.3 10*3/uL — AB (ref 1.4–6.5)
Neutrophils Relative %: 71 %
Platelets: 223 10*3/uL (ref 150–440)
RBC: 4.58 MIL/uL (ref 3.80–5.20)
RDW: 14.4 % (ref 11.5–14.5)
WBC: 10.3 10*3/uL (ref 3.6–11.0)

## 2015-05-23 LAB — COMPREHENSIVE METABOLIC PANEL
ALBUMIN: 3.9 g/dL (ref 3.5–5.0)
ALT: 19 U/L (ref 14–54)
AST: 19 U/L (ref 15–41)
Alkaline Phosphatase: 80 U/L (ref 38–126)
Anion gap: 9 (ref 5–15)
BILIRUBIN TOTAL: 0.2 mg/dL — AB (ref 0.3–1.2)
BUN: 14 mg/dL (ref 6–20)
CHLORIDE: 100 mmol/L — AB (ref 101–111)
CO2: 27 mmol/L (ref 22–32)
Calcium: 9.1 mg/dL (ref 8.9–10.3)
Creatinine, Ser: 0.83 mg/dL (ref 0.44–1.00)
GFR calc Af Amer: >60 mL/min (ref >60)
GFR calc non Af Amer: >60 mL/min (ref >60)
GLUCOSE: 104 mg/dL — AB (ref 65–99)
Potassium: 4.2 mmol/L (ref 3.5–5.1)
SODIUM: 136 mmol/L (ref 135–145)
Total Protein: 7.6 g/dL (ref 6.5–8.1)

## 2015-05-23 LAB — PROTIME-INR
INR: 1.03
Prothrombin Time: 13.7 seconds (ref 11.4–15.0)

## 2015-05-23 LAB — ABO/RH: ABO/RH(D): A NEG

## 2015-05-23 LAB — TSH: TSH: 3.248 u[IU]/mL (ref 0.350–4.500)

## 2015-05-23 MED ORDER — ATENOLOL 50 MG PO TABS
50.0000 mg | ORAL_TABLET | Freq: Every day | ORAL | Status: DC
  Administered 2015-05-23 – 2015-05-27 (×5): 50 mg via ORAL
  Filled 2015-05-23 (×5): qty 1

## 2015-05-23 MED ORDER — SODIUM CHLORIDE 0.9 % IV SOLN
INTRAVENOUS | Status: DC
  Administered 2015-05-23 – 2015-05-24 (×3): via INTRAVENOUS

## 2015-05-23 MED ORDER — MORPHINE SULFATE (PF) 2 MG/ML IV SOLN: 2.0000 mg | INTRAVENOUS | Status: DC | PRN

## 2015-05-23 MED ORDER — ACETAMINOPHEN 650 MG RE SUPP: 650.0000 mg | Freq: Four times a day (QID) | RECTAL | Status: DC | PRN

## 2015-05-23 MED ORDER — SODIUM CHLORIDE 0.9 % IJ SOLN: 3.0000 mL | Freq: Two times a day (BID) | INTRAMUSCULAR | Status: DC

## 2015-05-23 MED ORDER — ONDANSETRON HCL 4 MG/2ML IJ SOLN
4.0000 mg | Freq: Once | INTRAMUSCULAR | Status: AC
  Administered 2015-05-23: 4 mg via INTRAVENOUS
  Filled 2015-05-23: qty 2

## 2015-05-23 MED ORDER — ONDANSETRON HCL 4 MG PO TABS: 4.0000 mg | ORAL_TABLET | Freq: Four times a day (QID) | ORAL | Status: DC | PRN

## 2015-05-23 MED ORDER — LORATADINE 10 MG PO TABS
10.0000 mg | ORAL_TABLET | Freq: Every day | ORAL | Status: DC
  Administered 2015-05-23 – 2015-05-27 (×4): 10 mg via ORAL
  Filled 2015-05-23 (×4): qty 1

## 2015-05-23 MED ORDER — VITAMIN D (ERGOCALCIFEROL) 1.25 MG (50000 UNIT) PO CAPS: 50000.0000 [IU] | ORAL_CAPSULE | ORAL | Status: DC

## 2015-05-23 MED ORDER — ACETAMINOPHEN 325 MG PO TABS
650.0000 mg | ORAL_TABLET | Freq: Four times a day (QID) | ORAL | Status: DC | PRN
  Administered 2015-05-25 – 2015-05-26 (×2): 650 mg via ORAL
  Filled 2015-05-23 (×2): qty 2

## 2015-05-23 MED ORDER — BISACODYL 10 MG RE SUPP
10.0000 mg | Freq: Every day | RECTAL | Status: DC | PRN
  Administered 2015-05-25: 10 mg via RECTAL
  Filled 2015-05-23: qty 1

## 2015-05-23 MED ORDER — CEFAZOLIN (ANCEF) 1 G IV SOLR
1.0000 g | INTRAVENOUS | Status: DC
  Filled 2015-05-23: qty 1

## 2015-05-23 MED ORDER — TRAMADOL HCL 50 MG PO TABS
50.0000 mg | ORAL_TABLET | Freq: Four times a day (QID) | ORAL | Status: DC | PRN
  Administered 2015-05-25 – 2015-05-26 (×2): 50 mg via ORAL
  Filled 2015-05-23 (×2): qty 1

## 2015-05-23 MED ORDER — ONDANSETRON HCL 4 MG/2ML IJ SOLN: 4.0000 mg | Freq: Four times a day (QID) | INTRAMUSCULAR | Status: DC | PRN

## 2015-05-23 MED ORDER — LISINOPRIL 10 MG PO TABS
10.0000 mg | ORAL_TABLET | Freq: Every day | ORAL | Status: DC
  Administered 2015-05-23 – 2015-05-27 (×4): 10 mg via ORAL
  Filled 2015-05-23 (×5): qty 1

## 2015-05-23 MED ORDER — DOCUSATE SODIUM 100 MG PO CAPS
100.0000 mg | ORAL_CAPSULE | Freq: Two times a day (BID) | ORAL | Status: DC
  Administered 2015-05-23 – 2015-05-27 (×7): 100 mg via ORAL
  Filled 2015-05-23 (×7): qty 1

## 2015-05-23 MED ORDER — PRAVASTATIN SODIUM 20 MG PO TABS
20.0000 mg | ORAL_TABLET | Freq: Every day | ORAL | Status: DC
  Administered 2015-05-23 – 2015-05-26 (×4): 20 mg via ORAL
  Filled 2015-05-23 (×4): qty 1

## 2015-05-23 MED ORDER — LEVOTHYROXINE SODIUM 100 MCG PO TABS
100.0000 ug | ORAL_TABLET | Freq: Every day | ORAL | Status: DC
  Administered 2015-05-25 – 2015-05-27 (×3): 100 ug via ORAL
  Filled 2015-05-23 (×3): qty 1

## 2015-05-23 MED ORDER — MORPHINE SULFATE (PF) 4 MG/ML IV SOLN
4.0000 mg | Freq: Once | INTRAVENOUS | Status: AC
  Administered 2015-05-23: 4 mg via INTRAVENOUS
  Filled 2015-05-23: qty 1

## 2015-05-23 MED ORDER — POLYETHYLENE GLYCOL 3350 17 G PO PACK: 17.0000 g | PACK | Freq: Every day | ORAL | Status: DC | PRN

## 2015-05-23 MED ORDER — PRIMIDONE 250 MG PO TABS
250.0000 mg | ORAL_TABLET | Freq: Three times a day (TID) | ORAL | Status: DC
  Administered 2015-05-23 – 2015-05-27 (×8): 250 mg via ORAL
  Filled 2015-05-23: qty 1
  Filled 2015-05-23: qty 5
  Filled 2015-05-23 (×4): qty 1
  Filled 2015-05-23 (×2): qty 5
  Filled 2015-05-23: qty 1

## 2015-05-23 NOTE — ED Notes (Signed)
According to EMS, pt fell on Tuesday and [facility]. Femoral Fracture was confirmed upon X-Ray.

## 2015-05-23 NOTE — Consult Note (Signed)
Patient comes in after a fall 2 days ago. She has been having shortening and external rotation of the right leg and had portable x-ray that showed a femoral neck fracture. She is brought to the emergency room and follow-up x-ray was taken and that fragment x-ray shows a displaced femoral neck fracture and she is being admitted for treatment of this. She has a history of dementia and benign tremors. She's been a minimal ambulator with walker. She is seen with her daughter present who is her historian  On examination the right leg is shortened and externally rotated with palpable dorsalis pedis and posterior tibial pulse. There is mild swelling to the thigh skin is intact without ecchymosis. She is able flex extend her toes to request.  Radiographs: AP pelvis lateral right hip show displaced femoral neck fracture with comminution and moderate osteoarthritis.  Impression right femoral neck fracture displaced with osteoarthritis  Plan: Right anterior total hip replacement. Risks benefits possible competitions were discussed with her daughter who is a Engineer, civil (consulting)nurse. Then on surgery tomorrow if there is no medical problem

## 2015-05-23 NOTE — Progress Notes (Signed)
Patient arrived to the unit from ED. Patient alert to self X1. Patient unable to complete screenings due to AMS.  Patient denies pain resting between care. Cardiac monitoring placed. Spo2 placed 2L. IV fluids infusing. Call bell within reach.

## 2015-05-23 NOTE — ED Provider Notes (Addendum)
[facility] Emergency Department Provider Note  ____________________________________________  Time seen: Approximately 5:04 PM  I have reviewed the triage vital signs and the nursing notes.   HISTORY  Chief Complaint Leg Injury    HPI Geneal Huebert is a 81 y.o. female patient fell Tuesday no other injury did not want to go to the hospital refused EMS transport is been laying since then. Planes of pain in the right hip. X-rays done by mobile unit today show a femoral neck fracture.   Past Medical History  Diagnosis Date  . Dementia   . Depression   . Osteoarthritis   . Hypothyroidism   . Hypertension   . GERD (gastroesophageal reflux disease)   . Femur fracture, right (HCC)     There are no active problems to display for this patient.   History reviewed. No pertinent past surgical history.  Current Outpatient Rx  Name  Route  Sig  Dispense  Refill  . atenolol (TENORMIN) 50 MG tablet   Oral   Take 50 mg by mouth daily.         . Levothyroxine Sodium 100 MCG CAPS   Oral   Take 100 mcg by mouth daily.         Marland Kitchen lisinopril (PRINIVIL,ZESTRIL) 10 MG tablet   Oral   Take 10 mg by mouth daily.         Marland Kitchen loratadine (CLARITIN) 10 MG tablet   Oral   Take 10 mg by mouth daily.         . pravastatin (PRAVACHOL) 20 MG tablet   Oral   Take 20 mg by mouth at bedtime.         . primidone (MYSOLINE) 250 MG tablet   Oral   Take 250 mg by mouth 3 (three) times daily. At 8am, 1pm, and bedtime         . Vitamin D, Ergocalciferol, (DRISDOL) 50000 UNITS CAPS capsule   Oral   Take 50,000 Units by mouth every 30 (thirty) days. Once a month on the first Monday           Allergies Codeine  History reviewed. No pertinent family history.  Social History Social History  Substance Use Topics  . Smoking status: Never Smoker   . Smokeless tobacco: None  . Alcohol Use: No    Review of Systems Constitutional: No fever/chills Eyes: No  visual changes. ENT: No sore throat. Cardiovascular: Denies chest pain. Respiratory: Denies shortness of breath. Gastrointestinal: No abdominal pain.  No nausea, no vomiting.  No diarrhea.  No constipation. Genitourinary: Negative for dysuria. Musculoskeletal: Negative for back pain. Skin: Negative for rash. Neurological: Negative for headaches, focal weakness or numbness.  10-point ROS otherwise negative.  ____________________________________________   PHYSICAL EXAM:  VITAL SIGNS: ED Triage Vitals  Enc Vitals Group     BP --      Pulse --      Resp --      Temp --      Temp src --      SpO2 --      Weight --      Height --      Head Cir --      Peak Flow --      Pain Score --      Pain Loc --      Pain Edu? --      Excl. in GC? --     Constitutional: Alert and oriented. Well appearing and  in no acute distress if laying still. Eyes: Conjunctivae are normal. PERRL. EOMI. Head: Atraumatic. Nose: No congestion/rhinnorhea. Mouth/Throat: Mucous membranes are moist.  Oropharynx non-erythematous. Neck: No stridor No cervical spine tenderness to palpation Cardiovascular: Normal rate, regular rhythm. Grossly normal heart sounds.  Good peripheral circulation. Respiratory: Normal respiratory effort.  No retractions. Lungs CTAB. Gastrointestinal: Soft and nontender. No distention. No abdominal bruits. No CVA tenderness. Musculoskeletal: Right leg shortened and internally rotated sensation and pulse in the foot is intact on both sides normal capillary refill  No joint effusions. Neurologic:  Normal speech and language. No gross focal neurologic deficits are appreciated. . Skin:  Skin is warm, dry and intact. No rash noted.   ____________________________________________   LABS (all labs ordered are listed, but only abnormal results are displayed)  Labs Reviewed  COMPREHENSIVE METABOLIC PANEL  CBC WITH DIFFERENTIAL/PLATELET  PROTIME-INR  URINALYSIS COMPLETEWITH  MICROSCOPIC ([facility] ONLY)  TYPE AND SCREEN   ____________________________________________  EKG   ____________________________________________  RADIOLOGY   ____________________________________________   PROCEDURES  ____________________________________________   INITIAL IMPRESSION / ASSESSMENT AND PLAN / ED COURSE  Pertinent labs & imaging results that were available during my care of the patient were reviewed by me and considered in my medical decision making (see chart for details).   ____________________________________________   FINAL CLINICAL IMPRESSION(S) / ED DIAGNOSES  Final diagnoses:  Hip fracture, right, closed, initial encounter (HCC)      Arnaldo NatalPaul F Yolandra Habig, MD 05/23/15 1731  EKG read and interpreted by me shows normal sinus rhythm at a rate of 91 normal axis nonspecific ST-T wave changes no acute changes  Arnaldo NatalPaul F Efrem Pitstick, MD 05/23/15 1909

## 2015-05-23 NOTE — H&P (Signed)
[facility] [place] at [facility]   PATIENT NAME: Brandy Oconnor    MR#:  [id number]  DATE OF BIRTH:  10-06-1930  DATE OF ADMISSION:  05/23/2015  PRIMARY CARE PHYSICIAN: [facility]  REQUESTING/REFERRING PHYSICIAN: Dr. Dorothea Glassman  CHIEF COMPLAINT:   Chief Complaint  Patient presents with  . Leg Injury    Right Femur Fracutre    HISTORY OF PRESENT ILLNESS:  Brandy Oconnor  is a 81 y.o. female with a known history of benign essential tremors of her voice and head, dementia, hypertension hyperlipidemia presents to the hospital from home secondary to fall and right hip pain. Patient is a poor historian, she also has tremors of her voice and slow to respond. Also has underlying dementia. Daughter at bedside provides some history but daughter lives in [place] and she has just been with her mother for the last 2 weeks only. Most of the history is obtained from old records. Patient has been moved to [facility] recently, was living by herself, ambulates with a walker at baseline. Patient has issues with balance and 2 days ago, she lost her balance and tripped and fell to the floor. She complained of significant right hip pain since then, but didn't want to come to the emergency room. She was trying to avoid putting weight on that leg but pain has been getting worse so her physician was called. An x-ray taken by the mobile unit showed right femoral neck fracture so she is sent to the emergency room. Labs are pending at this time. EKG is pending. Patient denies any chest pain, no dyspnea, no prior cardiac history.  PAST MEDICAL HISTORY:   Past Medical History  Diagnosis Date  . Hyperlipidemia   . Depression   . Osteoarthritis   . Hypothyroidism   . Hypertension   . GERD (gastroesophageal reflux disease)   . Femur fracture, right (HCC)   . Tremor     benign essential tremor- tremors of voice, head and jaw  . Dementia     PAST SURGICAL HISTORY:   Past  Surgical History  Procedure Laterality Date  . Abdominal hysterectomy    . Cholecystectomy    . Appendectomy    . Thyroidectomy, partial      SOCIAL HISTORY:   Social History  Substance Use Topics  . Smoking status: Never Smoker   . Smokeless tobacco: Not on file  . Alcohol Use: No    FAMILY HISTORY:   Family History  Problem Relation Age of Onset  . Diabetes Mellitus II Mother   . Other Father     Tremors    DRUG ALLERGIES:   Allergies  Allergen Reactions  . Codeine Rash    REVIEW OF SYSTEMS:   Review of Systems  Unable to perform ROS: dementia    MEDICATIONS AT HOME:   Prior to Admission medications   Medication Sig Start Date End Date Taking? Authorizing Provider  atenolol (TENORMIN) 50 MG tablet Take 50 mg by mouth daily.    Historical Provider, MD  Levothyroxine Sodium 100 MCG CAPS Take 100 mcg by mouth daily.    Historical Provider, MD  lisinopril (PRINIVIL,ZESTRIL) 10 MG tablet Take 10 mg by mouth daily.    Historical Provider, MD  loratadine (CLARITIN) 10 MG tablet Take 10 mg by mouth daily.    Historical Provider, MD  pravastatin (PRAVACHOL) 20 MG tablet Take 20 mg by mouth at bedtime.    Historical Provider, MD  primidone (MYSOLINE) 250 MG  tablet Take 250 mg by mouth 3 (three) times daily. At 8am, 1pm, and bedtime    Historical Provider, MD  Vitamin D, Ergocalciferol, (DRISDOL) 50000 UNITS CAPS capsule Take 50,000 Units by mouth every 30 (thirty) days. Once a month on the first Monday    Historical Provider, MD      VITAL SIGNS:  There were no vitals taken for this visit.  PHYSICAL EXAMINATION:   Physical Exam  GENERAL:  81 y.o.-year-old patient lying in the bed with no acute distress.  EYES: Pupils equal, round, reactive to light and accommodation. No scleral icterus. Extraocular muscles intact.  HEENT: Head atraumatic, normocephalic. Oropharynx and nasopharynx clear. Very dry tongue and mucous membranes noted NECK:  Supple, no jugular venous  distention. No thyroid enlargement, no tenderness.  LUNGS: Normal breath sounds bilaterally, no wheezing, rales,rhonchi or crepitation. No use of accessory muscles of respiration.  CARDIOVASCULAR: S1, S2 normal. No rubs, or gallops. He/6 systolic murmur is present ABDOMEN: Soft, nontender, nondistended. Bowel sounds present. No organomegaly or mass.  EXTREMITIES: Right leg is abducted and externally rotated, No pedal edema, cyanosis, or clubbing.  NEUROLOGIC: Cranial nerves II through XII are intact. Muscle strength 5/5 in all extremities. Unable to test right lower extremity strength due to pain.. Sensation is intact, able to wiggle the toes. Sensation intact. Gait not checked.  PSYCHIATRIC: The patient is alert and oriented to self.  SKIN: No obvious rash, lesion, or ulcer.   LABORATORY PANEL:   CBC No results for input(s): WBC, HGB, HCT, PLT in the last 168 hours. ------------------------------------------------------------------------------------------------------------------  Chemistries  No results for input(s): NA, K, CL, CO2, GLUCOSE, BUN, CREATININE, CALCIUM, MG, AST, ALT, ALKPHOS, BILITOT in the last 168 hours.  Invalid input(s): GFRCGP ------------------------------------------------------------------------------------------------------------------  Cardiac Enzymes No results for input(s): TROPONINI in the last 168 hours. ------------------------------------------------------------------------------------------------------------------  RADIOLOGY:  No results found.  EKG:   Orders placed or performed during the hospital encounter of 05/23/15  . ED EKG  . ED EKG    IMPRESSION AND PLAN:   Brandy DressSarah Oconnor  is a 81 y.o. female with a known history of benign essential tremors of her voice and head, dementia, hypertension hyperlipidemia presents to the hospital from home secondary to fall and right hip pain.  # 1 right hip fracture-right femoral neck fracture based on mobile  x-ray report. -Repeat x-rays ordered here. Orthopedics has been consulted. -Patient seems a low risk for surgery pending her labs and EKG at this time. -Pain management, postoperative physical therapy and DVT prophylaxis recommended -Appears dehydrated on clinical exam, IV fluids started and follow-up chest x-ray.  #2 hypertension-verify home medications. -Continue atenolol and lisinopril  #3 hypothyroidism-on Synthroid, check TSH.  #4 benign essential tremors-continue primidone.  #5 DVT prophylaxis-Ted's and SCDs. No heparin products due to possible surgery tomorrow    All the records are reviewed and case discussed with ED provider. Management plans discussed with the patient, family and they are in agreement.  CODE STATUS: Full code  TOTAL TIME TAKING CARE OF THIS PATIENT: 50 minutes.    Enid BaasKALISETTI,Brandy Oconnor M.D on 05/23/2015 at 6:13 PM  Between 7am to 6pm - Pager - [phone]  After 6pm go to [email or web address] - password [email or web address] [facility]  [facility]  Office  [phone]  CC: Primary care physician; No primary care provider on file.

## 2015-05-24 ENCOUNTER — Encounter: Payer: Self-pay | Admitting: Anesthesiology

## 2015-05-24 ENCOUNTER — Encounter
Admission: EM | Disposition: A | Discharge status: Skilled Nursing Facility | Payer: Self-pay | Source: Home / Self Care | Attending: Internal Medicine

## 2015-05-24 ENCOUNTER — Inpatient Hospital Stay: Payer: Medicare (Managed Care)

## 2015-05-24 ENCOUNTER — Inpatient Hospital Stay: Payer: Medicare (Managed Care) | Admitting: Anesthesiology

## 2015-05-24 HISTORY — PX: TOTAL HIP ARTHROPLASTY: SHX124

## 2015-05-24 LAB — CBC
HCT: 38.8 % (ref 35.0–47.0)
Hemoglobin: 13.1 g/dL (ref 12.0–16.0)
MCH: 32.9 pg (ref 26.0–34.0)
MCHC: 33.6 g/dL (ref 32.0–36.0)
MCV: 98.1 fL (ref 80.0–100.0)
PLATELETS: 186 10*3/uL (ref 150–440)
RBC: 3.96 MIL/uL (ref 3.80–5.20)
RDW: 14.7 % — ABNORMAL HIGH (ref 11.5–14.5)
WBC: 8.4 10*3/uL (ref 3.6–11.0)

## 2015-05-24 LAB — BASIC METABOLIC PANEL
Anion gap: 9 (ref 5–15)
BUN: 16 mg/dL (ref 6–20)
CALCIUM: 8.2 mg/dL — AB (ref 8.9–10.3)
CHLORIDE: 105 mmol/L (ref 101–111)
CO2: 23 mmol/L (ref 22–32)
CREATININE: 0.93 mg/dL (ref 0.44–1.00)
GFR calc non Af Amer: 56 mL/min — ABNORMAL LOW (ref >60)
Glucose, Bld: 111 mg/dL — ABNORMAL HIGH (ref 65–99)
Potassium: 4.2 mmol/L (ref 3.5–5.1)
Sodium: 137 mmol/L (ref 135–145)

## 2015-05-24 LAB — MRSA PCR SCREENING: MRSA BY PCR: NEGATIVE

## 2015-05-24 SURGERY — ARTHROPLASTY, HIP, TOTAL, ANTERIOR APPROACH
Anesthesia: Monitor Anesthesia Care | Site: Hip | Laterality: Right | Wound class: Clean

## 2015-05-24 MED ORDER — PHENOL 1.4 % MT LIQD: 1.0000 | OROMUCOSAL | Status: DC | PRN

## 2015-05-24 MED ORDER — MAGNESIUM CITRATE PO SOLN: 1.0000 | Freq: Once | ORAL | Status: DC | PRN

## 2015-05-24 MED ORDER — OXYCODONE HCL 5 MG/5ML PO SOLN: 5.0000 mg | Freq: Once | ORAL | Status: DC | PRN

## 2015-05-24 MED ORDER — DIPHENHYDRAMINE HCL 12.5 MG/5ML PO ELIX: 12.5000 mg | ORAL_SOLUTION | ORAL | Status: DC | PRN

## 2015-05-24 MED ORDER — NEOMYCIN-POLYMYXIN B GU 40-200000 IR SOLN
Status: DC | PRN
  Administered 2015-05-24: 4 mL

## 2015-05-24 MED ORDER — KETAMINE HCL 50 MG/ML IJ SOLN
INTRAMUSCULAR | Status: DC | PRN
  Administered 2015-05-24: 5 mg via INTRAMUSCULAR
  Administered 2015-05-24 (×2): 10 mg via INTRAMUSCULAR

## 2015-05-24 MED ORDER — PROPOFOL 10 MG/ML IV BOLUS
INTRAVENOUS | Status: DC | PRN
  Administered 2015-05-24 (×4): 10 mg via INTRAVENOUS

## 2015-05-24 MED ORDER — TRANEXAMIC ACID 1000 MG/10ML IV SOLN
1000.0000 mg | INTRAVENOUS | Status: DC | PRN
  Administered 2015-05-24: 1000 mg via INTRAVENOUS

## 2015-05-24 MED ORDER — SODIUM CHLORIDE 0.9 % IV SOLN
INTRAVENOUS | Status: DC
  Administered 2015-05-24 – 2015-05-26 (×3): via INTRAVENOUS

## 2015-05-24 MED ORDER — BUPIVACAINE-EPINEPHRINE 0.25% -1:200000 IJ SOLN
INTRAMUSCULAR | Status: DC | PRN
  Administered 2015-05-24: 30 mL

## 2015-05-24 MED ORDER — BUPIVACAINE HCL (PF) 0.5 % IJ SOLN
INTRAMUSCULAR | Status: DC | PRN
Start: 2015-05-24 — End: 2015-05-24
  Administered 2015-05-24: 2 mL

## 2015-05-24 MED ORDER — LACTATED RINGERS IV SOLN
INTRAVENOUS | Status: DC | PRN
  Administered 2015-05-24: 15:00:00 via INTRAVENOUS

## 2015-05-24 MED ORDER — OXYCODONE HCL 5 MG PO TABS: 5.0000 mg | ORAL_TABLET | ORAL | Status: DC | PRN

## 2015-05-24 MED ORDER — ENOXAPARIN SODIUM 40 MG/0.4ML ~~LOC~~ SOLN
40.0000 mg | SUBCUTANEOUS | Status: DC
  Administered 2015-05-25 – 2015-05-27 (×3): 40 mg via SUBCUTANEOUS
  Filled 2015-05-24 (×3): qty 0.4

## 2015-05-24 MED ORDER — MAGNESIUM HYDROXIDE 400 MG/5ML PO SUSP: 30.0000 mL | Freq: Every day | ORAL | Status: DC | PRN

## 2015-05-24 MED ORDER — PROPOFOL 500 MG/50ML IV EMUL
INTRAVENOUS | Status: DC | PRN
  Administered 2015-05-24: 25 ug/kg/min via INTRAVENOUS

## 2015-05-24 MED ORDER — MORPHINE SULFATE (PF) 2 MG/ML IV SOLN
2.0000 mg | INTRAVENOUS | Status: DC | PRN
Start: 2015-05-24 — End: 2015-05-27

## 2015-05-24 MED ORDER — CEFAZOLIN SODIUM 1-5 GM-% IV SOLN
1.0000 g | Freq: Four times a day (QID) | INTRAVENOUS | Status: AC
  Administered 2015-05-24 (×2): 1 g via INTRAVENOUS
  Filled 2015-05-24 (×2): qty 50

## 2015-05-24 MED ORDER — PHENYLEPHRINE HCL 10 MG/ML IJ SOLN
INTRAMUSCULAR | Status: DC | PRN
Start: 2015-05-24 — End: 2015-05-24
  Administered 2015-05-24: 150 ug via INTRAVENOUS
  Administered 2015-05-24: 50 ug via INTRAVENOUS
  Administered 2015-05-24 (×5): 100 ug via INTRAVENOUS
  Administered 2015-05-24 (×2): 150 ug via INTRAVENOUS
  Administered 2015-05-24: 100 ug via INTRAVENOUS

## 2015-05-24 MED ORDER — OXYCODONE HCL 5 MG PO TABS: 5.0000 mg | ORAL_TABLET | Freq: Once | ORAL | Status: DC | PRN

## 2015-05-24 MED ORDER — CEFAZOLIN SODIUM 1-5 GM-% IV SOLN
INTRAVENOUS | Status: DC | PRN
  Administered 2015-05-24: 1 g via INTRAVENOUS

## 2015-05-24 MED ORDER — FENTANYL CITRATE (PF) 100 MCG/2ML IJ SOLN: 25.0000 ug | INTRAMUSCULAR | Status: DC | PRN

## 2015-05-24 MED ORDER — MENTHOL 3 MG MT LOZG: 1.0000 | LOZENGE | OROMUCOSAL | Status: DC | PRN

## 2015-05-24 MED ORDER — ACETAMINOPHEN 500 MG PO TABS
1000.0000 mg | ORAL_TABLET | Freq: Four times a day (QID) | ORAL | Status: AC
  Administered 2015-05-24 – 2015-05-25 (×4): 1000 mg via ORAL
  Filled 2015-05-24 (×4): qty 2

## 2015-05-24 MED ORDER — FENTANYL CITRATE (PF) 100 MCG/2ML IJ SOLN
INTRAMUSCULAR | Status: DC | PRN
Start: 2015-05-24 — End: 2015-05-24
  Administered 2015-05-24: 25 ug via INTRAVENOUS

## 2015-05-24 MED ORDER — SODIUM CHLORIDE 0.9 % IV SOLN
10000.0000 ug | INTRAVENOUS | Status: DC | PRN
  Administered 2015-05-24: 100 ug via INTRAVENOUS

## 2015-05-24 MED ORDER — ALUM & MAG HYDROXIDE-SIMETH 200-200-20 MG/5ML PO SUSP: 30.0000 mL | ORAL | Status: DC | PRN

## 2015-05-24 MED ORDER — INFLUENZA VAC SPLIT QUAD 0.5 ML IM SUSY
0.5000 mL | PREFILLED_SYRINGE | INTRAMUSCULAR | Status: AC
  Administered 2015-05-25: 0.5 mL via INTRAMUSCULAR
  Filled 2015-05-24: qty 0.5

## 2015-05-24 SURGICAL SUPPLY — 43 items
BLADE SAW 1/2 (BLADE) ×3 IMPLANT
BNDG COHESIVE 6X5 TAN STRL LF (GAUZE/BANDAGES/DRESSINGS) ×6 IMPLANT
CANISTER SUCT 1200ML W/VALVE (MISCELLANEOUS) ×3 IMPLANT
CAPT HIP TOTAL 3 ×3 IMPLANT
CATH FOL LEG HOLDER (MISCELLANEOUS) ×3 IMPLANT
CATH TRAY METER 16FR LF (MISCELLANEOUS) ×3 IMPLANT
CHLORAPREP W/TINT 26ML (MISCELLANEOUS) ×3 IMPLANT
DRAPE C-ARM XRAY 36X54 (DRAPES) ×3 IMPLANT
DRAPE INCISE IOBAN 66X60 STRL (DRAPES) IMPLANT
DRAPE POUCH INSTRU U-SHP 10X18 (DRAPES) ×3 IMPLANT
DRAPE SHEET LG 3/4 BI-LAMINATE (DRAPES) ×9 IMPLANT
DRAPE TABLE BACK 80X90 (DRAPES) ×3 IMPLANT
ELECT BLADE 6.5 EXT (BLADE) ×3 IMPLANT
GAUZE SPONGE 4X4 12PLY STRL (GAUZE/BANDAGES/DRESSINGS) ×3 IMPLANT
GLOVE BIOGEL PI IND STRL 9 (GLOVE) ×1 IMPLANT
GLOVE BIOGEL PI INDICATOR 9 (GLOVE) ×2
GLOVE SURG ORTHO 9.0 STRL STRW (GLOVE) ×3 IMPLANT
GOWN SPECIALTY ULTRA XL (MISCELLANEOUS) ×3 IMPLANT
GOWN STRL REUS W/ TWL LRG LVL3 (GOWN DISPOSABLE) ×1 IMPLANT
GOWN STRL REUS W/TWL LRG LVL3 (GOWN DISPOSABLE) ×2
HEMOVAC 400CC 10FR (MISCELLANEOUS) ×3 IMPLANT
HOOD PEEL AWAY FACE SHEILD DIS (HOOD) ×3 IMPLANT
MAT BLUE FLOOR 46X72 FLO (MISCELLANEOUS) ×3 IMPLANT
NDL SAFETY 18GX1.5 (NEEDLE) ×3 IMPLANT
NEEDLE SPNL 18GX3.5 QUINCKE PK (NEEDLE) ×3 IMPLANT
NS IRRIG 1000ML POUR BTL (IV SOLUTION) ×3 IMPLANT
PACK HIP COMPR (MISCELLANEOUS) ×3 IMPLANT
SOL PREP PVP 2OZ (MISCELLANEOUS) ×3
SOLUTION PREP PVP 2OZ (MISCELLANEOUS) ×1 IMPLANT
STAPLER SKIN PROX 35W (STAPLE) ×3 IMPLANT
STRAP SAFETY BODY (MISCELLANEOUS) ×3 IMPLANT
SUT DVC 2 QUILL PDO  T11 36X36 (SUTURE) ×2
SUT DVC 2 QUILL PDO T11 36X36 (SUTURE) ×1 IMPLANT
SUT DVC QUILL MONODERM 30X30 (SUTURE) ×3 IMPLANT
SUT ETHIBOND NAB CT1 #1 30IN (SUTURE) ×3 IMPLANT
SUT SILK 0 (SUTURE) ×2
SUT SILK 0 30XBRD TIE 6 (SUTURE) ×1 IMPLANT
SUT VIC AB 1 CT1 36 (SUTURE) ×3 IMPLANT
SYR 20CC LL (SYRINGE) ×3 IMPLANT
SYR 30ML LL (SYRINGE) ×3 IMPLANT
TAPE MICROFOAM 4IN (TAPE) ×3 IMPLANT
TUBE KAMVAC SUCTION (TUBING) ×3 IMPLANT
WATER STERILE IRR 1000ML POUR (IV SOLUTION) ×3 IMPLANT

## 2015-05-24 NOTE — Transfer of Care (Signed)
Immediate Anesthesia Transfer of Care Note  Patient: Brandy DressSarah Degen  Procedure(s) Performed: Procedure(s): TOTAL HIP ARTHROPLASTY ANTERIOR APPROACH (Right)  Patient Location: PACU  Anesthesia Type:General  Level of Consciousness: sedated  Airway & Oxygen Therapy: Patient Spontanous Breathing and Patient connected to face mask oxygen  Post-op Assessment: Report given to RN and Post -op Vital signs reviewed and stable  Post vital signs: Reviewed and stable  Last Vitals:  Filed Vitals:   05/24/15 1555  BP: 137/63  Pulse:   Temp: 37.3 C  Resp: 15    Complications: No apparent anesthesia complications

## 2015-05-24 NOTE — Op Note (Signed)
05/23/2015 - 05/24/2015  3:53 PM  PATIENT:  Nicoletta DressSarah Parthasarathy  81 y.o. female  PRE-OPERATIVE DIAGNOSIS:  fractured hip right femoral neck displaced with osteoarthritis  POST-OPERATIVE DIAGNOSIS:  Same  PROCEDURE:  Procedure(s): TOTAL HIP ARTHROPLASTY ANTERIOR APPROACH (Right)  SURGEON: Leitha SchullerMichael J Hayes Rehfeldt, MD  ASSISTANTS: None  ANESTHESIA:   spinal  EBL:  Total I/O In: 600 [I.V.:600] Out: 200 [Urine:100; Blood:100]  BLOOD ADMINISTERED:none  DRAINS: none   LOCAL MEDICATIONS USED:  MARCAINE     SPECIMEN:  Source of Specimen:  Right femoral head  DISPOSITION OF SPECIMEN:  PATHOLOGY  COUNTS:  YES  TOURNIQUET:  * No tourniquets in log *  IMPLANTS: Medacta AMIS 2 collared stem with 52 mm Mpact cup DM with liner and S 28 mm head  DICTATION: .Dragon Dictation   The patient was brought to the operating room and after spinal anesthesia was obtained patient was placed on the operative table with the ipsilateral foot into the Medacta attachment, contralateral leg on a well-padded table. C-arm was brought in and preop template x-ray taken. After prepping and draping in usual sterile fashion appropriate patient identification and timeout procedures were completed. Anterior approach to the hip was obtained and centered over the greater trochanter and TFL muscle. The subcutaneous tissue was incised hemostasis being achieved by electrocautery. TFL fascia was incised and the muscle retracted laterally deep retractor placed. The lateral femoral circumflex vessels were identified and ligated. The anterior capsule was exposed and a capsulotomy performed. The neck was identified and a femoral neck cut carried out with a saw below the level of the fracture. The head was removed without difficulty and showed sclerotic femoral head and acetabulum. Reaming was carried out to 50 mm and a 52  mm cup trial gave appropriate tightness to the acetabular component a 52 and Mpact cup cup was impacted into position. The  leg was then externally rotated and ischiofemoral and pubfemoral releases carried out. The femur was sequentially broached to a size 2, size 2 stem standard offset and S head trials were placed and the final components chosen. The 2 stem was inserted along with a S 28 mm head and 52 mm liner. The hip was reduced and was stable the wound was thoroughly irrigated with a dilute Betadine solution. The deep fascia was closed using 1-0 Vicryl after infiltration of 30 cc of quarter percent Sensorcaine with epinephrine and placement of TXA into the joint. 2-0 Quill to close the skin with skin staples Xeroform and honeycomb dressing applied   PLAN OF CARE: Continue as inpatient

## 2015-05-24 NOTE — Anesthesia Procedure Notes (Signed)
Spinal Patient location during procedure: OR Start time: 05/24/2015 1:58 PM End time: 05/24/2015 1:58 PM Staffing Anesthesiologist: Katy Fitch K Performed by: anesthesiologist  Preanesthetic Checklist Completed: patient identified, site marked, surgical consent, pre-op evaluation, timeout performed, IV checked, risks and benefits discussed and monitors and equipment checked Spinal Block Patient position: sitting Prep: Betadine Patient monitoring: heart rate, continuous pulse ox, blood pressure and cardiac monitor Approach: midline Location: L3-4 Injection technique: single-shot Needle Needle type: Whitacre and Introducer  Needle gauge: 24 G Needle length: 9 cm Assessment Sensory level: T10 Additional Notes Negative paresthesia. Negative blood return. Positive free-flowing CSF. Expiration date of kit checked and confirmed. Patient tolerated procedure well, without complications.

## 2015-05-24 NOTE — Anesthesia Preprocedure Evaluation (Signed)
Anesthesia Evaluation  Patient identified by MRN, date of birth, ID band Patient awake    Reviewed: Allergy & Precautions, H&P , NPO status , Patient's Chart, lab work & pertinent test results  History of Anesthesia Complications Negative for: history of anesthetic complications  Airway Mallampati: III  TM Distance: >3 FB Neck ROM: limited    Dental  (+) Poor Dentition, Missing, Edentulous Upper, Edentulous Lower   Pulmonary neg pulmonary ROS, neg shortness of breath,    Pulmonary exam normal breath sounds clear to auscultation       Cardiovascular Exercise Tolerance: Good hypertension, (-) angina(-) Past MI and (-) DOE Normal cardiovascular exam Rhythm:regular Rate:Normal     Neuro/Psych PSYCHIATRIC DISORDERS Depression negative neurological ROS     GI/Hepatic Neg liver ROS, GERD  Controlled,  Endo/Other  Hypothyroidism   Renal/GU negative Renal ROS  negative genitourinary   Musculoskeletal  (+) Arthritis ,   Abdominal   Peds  Hematology negative hematology ROS (+)   Anesthesia Other Findings Past Medical History:   Hyperlipidemia                                               Depression                                                   Osteoarthritis                                               Hypothyroidism                                               Hypertension                                                 GERD (gastroesophageal reflux disease)                       Femur fracture, right (HCC)                                  Tremor                                                         Comment:benign essential tremor- tremors of voice, head              and jaw   Dementia  Past Surgical History:   ABDOMINAL HYSTERECTOMY                                        CHOLECYSTECTOMY                                               APPENDECTOMY                                                   THYROIDECTOMY, PARTIAL                                       BMI    Body Mass Index   25.80 kg/m 2      Reproductive/Obstetrics negative OB ROS                             Anesthesia Physical Anesthesia Plan  ASA: III  Anesthesia Plan: General, MAC and Spinal   Post-op Pain Management:    Induction:   Airway Management Planned:   Additional Equipment:   Intra-op Plan:   Post-operative Plan:   Informed Consent: I have reviewed the patients History and Physical, chart, labs and discussed the procedure including the risks, benefits and alternatives for the proposed anesthesia with the patient or authorized representative who has indicated his/her understanding and acceptance.   Dental Advisory Given  Plan Discussed with: Anesthesiologist, CRNA and Surgeon  Anesthesia Plan Comments:         Anesthesia Quick Evaluation

## 2015-05-24 NOTE — Progress Notes (Signed)
[facility] [place] [facility]   PATIENT NAME: Brandy DressSarah Oconnor    MR#:  [id number]  DATE OF BIRTH:  06-23-1931  SUBJECTIVE: 81 year old female patient with dementia admitted for fall and noted to have right femoral neck fracture. Patient is seen today. She denies any pain, scheduled to have surgery by Dr. Rosita KeaMenz today.  CHIEF COMPLAINT:   Chief Complaint  Patient presents with  . Leg Injury    Right Femur Fracutre    REVIEW OF SYSTEMS:    Review of Systems  Constitutional: Negative for fever and chills.  HENT: Negative for hearing loss.   Eyes: Negative for blurred vision, double vision and photophobia.  Respiratory: Negative for cough, hemoptysis and shortness of breath.   Cardiovascular: Negative for palpitations, orthopnea and leg swelling.  Gastrointestinal: Negative for heartburn, vomiting, abdominal pain and diarrhea.  Genitourinary: Negative for dysuria and urgency.  Musculoskeletal: Negative for myalgias and neck pain.       Right hip pain.  Skin: Negative for rash.  Neurological: Negative for dizziness, focal weakness, seizures, weakness and headaches.  Endo/Heme/Allergies: Does not bruise/bleed easily.  Psychiatric/Behavioral: Negative for memory loss. The patient does not have insomnia.     Nutrition:  Tolerating Diet: Tolerating PT:      DRUG ALLERGIES:   Allergies  Allergen Reactions  . Codeine Rash    VITALS:  Blood pressure 107/40, pulse 72, temperature 97.7 F (36.5 C), temperature source Oral, resp. rate 18, weight 61.916 kg (136 lb 8 oz), SpO2 97 %.  PHYSICAL EXAMINATION:   Physical Exam  GENERAL:  81 y.o.-year-old patient lying in the bed with no acute distress.  EYES: Pupils equal, round, reactive to light and accommodation. No scleral icterus. Extraocular muscles intact.  HEENT: Head atraumatic, normocephalic. Oropharynx and nasopharynx clear.  NECK:  Supple, no jugular venous distention. No thyroid enlargement, no  tenderness.  LUNGS: Normal breath sounds bilaterally, no wheezing, rales,rhonchi or crepitation. No use of accessory muscles of respiration.  CARDIOVASCULAR: S1, S2 normal. No murmurs, rubs, or gallops.  ABDOMEN: Soft, nontender, nondistended. Bowel sounds present. No organomegaly or mass.  EXTREMITIES:  Right leg is shortened, externally rotated. Intact dorsalis pedis, posterior tibial pulse. Slight swelling of the right thigh skin is noted NEUROLOGIC: Cranial nerves II through XII are intact. Muscle strength 5/5 in all extremities. Sensation intact. Gait not checked.  PSYCHIATRIC: The patient is alert and oriented x 3.  SKIN: No obvious rash, lesion, or ulcer.    LABORATORY PANEL:   CBC  Recent Labs Lab 05/24/15 0600  WBC 8.4  HGB 13.1  HCT 38.8  PLT 186   ------------------------------------------------------------------------------------------------------------------  Chemistries   Recent Labs Lab 05/23/15 1845 05/24/15 0600  NA 136 137  K 4.2 4.2  CL 100* 105  CO2 27 23  GLUCOSE 104* 111*  BUN 14 16  CREATININE 0.83 0.93  CALCIUM 9.1 8.2*  AST 19  --   ALT 19  --   ALKPHOS 80  --   BILITOT 0.2*  --    ------------------------------------------------------------------------------------------------------------------  Cardiac Enzymes No results for input(s): TROPONINI in the last 168 hours. ------------------------------------------------------------------------------------------------------------------  RADIOLOGY:  Dg Chest 1 View  05/23/2015  CLINICAL DATA:  Preoperative for right femoral neck fracture ORIF EXAM: CHEST 1 VIEW COMPARISON:  10/21/2012 chest radiograph. FINDINGS: Stable cardiomediastinal silhouette with normal heart size. No pneumothorax. No pleural effusion. Clear lungs, with no focal lung consolidation and no pulmonary edema. Cholecystectomy clip in the right upper quadrant of the  abdomen. IMPRESSION: No active disease. Electronically Signed    By: Delbert Phenix M.D.   On: 05/23/2015 19:00   Dg Hip Unilat With Pelvis 2-3 Views Right  05/23/2015  CLINICAL DATA:  Right hip pain after fall EXAM: DG HIP (WITH OR WITHOUT PELVIS) 2-3V RIGHT COMPARISON:  None. FINDINGS: There is a comminuted mid right femoral neck fracture with mild apex lateral angulation, 1 cm superior displacement of the dominant distal fracture fragment and mild impaction. No dislocation at the right hip joint. No additional fracture. No suspicious focal osseous lesion. Diffuse osteopenia. IMPRESSION: Comminuted right mid femoral neck fracture as described. Electronically Signed   By: Delbert Phenix M.D.   On: 05/23/2015 18:58     ASSESSMENT AND PLAN:   Active Problems:   Hip fracture (HCC)   #1. Right femoral neck fracture with comminution; initial encounter; continue nothing by mouth, IV fluids, pain medications scheduled to have surgery today, right anterior hip replacement. Moderate risk for surgery secondary to advanced dementia, age. Continue postop pain medicine, incentive spirometry. #2. Hyperlipidemia continue statins 3 .hypertension controlled continue atenolol, lisinopril 4. Hypothyroidism continue Synthroid. #5 benign essential tremors continue primidone. GI, DVT prophylaxis CODE STATUS; full code      All the records are reviewed and case discussed with Care Management/Social Workerr. Management plans discussed with the patient, family and they are in agreement.  CODE STATUS: full  TOTAL TIME TAKING CARE OF THIS PATIENT: 35 minutes.   POSSIBLE D/C IN 3-4DAYS, DEPENDING ON CLINICAL CONDITION.   Katha Hamming M.D on 05/24/2015 at 8:12 AM  Between 7am to 6pm - Pager - [phone]  After 6pm go to [email or web address] - password [email or web address] [facility]  [facility]  Office  [phone]  CC: Primary care physician; No primary care provider on file.

## 2015-05-25 ENCOUNTER — Inpatient Hospital Stay: Payer: Medicare (Managed Care)

## 2015-05-25 DIAGNOSIS — S72001A Fracture of unspecified part of neck of right femur, initial encounter for closed fracture: Secondary | ICD-10-CM | POA: Diagnosis not present

## 2015-05-25 LAB — URINALYSIS COMPLETE WITH MICROSCOPIC (ARMC ONLY)
Bilirubin Urine: NEGATIVE
GLUCOSE, UA: NEGATIVE mg/dL
NITRITE: NEGATIVE
Protein, ur: 30 mg/dL — AB
Specific Gravity, Urine: 1.018 (ref 1.005–1.030)
pH: 6 (ref 5.0–8.0)

## 2015-05-25 LAB — BASIC METABOLIC PANEL
Anion gap: 7 (ref 5–15)
BUN: 10 mg/dL (ref 6–20)
CALCIUM: 7.6 mg/dL — AB (ref 8.9–10.3)
CO2: 22 mmol/L (ref 22–32)
CREATININE: 0.65 mg/dL (ref 0.44–1.00)
Chloride: 109 mmol/L (ref 101–111)
GFR calc non Af Amer: >60 mL/min (ref >60)
GLUCOSE: 115 mg/dL — AB (ref 65–99)
Potassium: 3.7 mmol/L (ref 3.5–5.1)
Sodium: 138 mmol/L (ref 135–145)

## 2015-05-25 LAB — CBC
HEMATOCRIT: 35.2 % (ref 35.0–47.0)
Hemoglobin: 11.8 g/dL — ABNORMAL LOW (ref 12.0–16.0)
MCH: 32.6 pg (ref 26.0–34.0)
MCHC: 33.6 g/dL (ref 32.0–36.0)
MCV: 97.1 fL (ref 80.0–100.0)
Platelets: 183 10*3/uL (ref 150–440)
RBC: 3.63 MIL/uL — AB (ref 3.80–5.20)
RDW: 14.3 % (ref 11.5–14.5)
WBC: 9.8 10*3/uL (ref 3.6–11.0)

## 2015-05-25 MED ORDER — DEXTROSE 5 % IV SOLN
1.0000 g | INTRAVENOUS | Status: DC
  Administered 2015-05-25 – 2015-05-26 (×2): 1 g via INTRAVENOUS
  Filled 2015-05-25 (×4): qty 10

## 2015-05-25 NOTE — [place] FL2 (Signed)
[place] MEDICAID FL2 LEVEL OF CARE SCREENING TOOL     IDENTIFICATION  Patient Name: Brandy Oconnor Birthdate: 1931-03-24 Sex: female Admission Date (Current Location): 05/23/2015  [place] and [place] Number:  Air cabin crew )   Facility and Address:  [facility], [address], [place], [place] [address]      Provider Number: [id number]  Attending Physician Name and Address:  Katha Hamming, MD  Relative Name and Phone Number:       Current Level of Care: Hospital Recommended Level of Care: Skilled Nursing Facility Prior Approval Number:    Date Approved/Denied:   PASRR Number:  [id number])  Discharge Plan: SNF    Current Diagnoses: Patient Active Problem List   Diagnosis Date Noted  . Hip fracture (HCC) 05/23/2015    Orientation ACTIVITIES/SOCIAL BLADDER RESPIRATION    Self  Active Incontinent Normal  BEHAVIORAL SYMPTOMS/MOOD NEUROLOGICAL BOWEL NUTRITION STATUS   (none)  (none) Continent Diet  PHYSICIAN VISITS COMMUNICATION OF NEEDS Height & Weight Skin  30 days Verbally   143 lbs. Normal          AMBULATORY STATUS RESPIRATION    Assist extensive Normal      Personal Care Assistance Level of Assistance  Bathing, Feeding, Dressing Bathing Assistance: Limited assistance Feeding assistance: Limited assistance Dressing Assistance: Limited assistance      Functional Limitations Info  Sight, Hearing Sight Info: Impaired Hearing Info: Impaired Speech Info: Adequate       SPECIAL CARE FACTORS FREQUENCY  PT (By licensed PT)                   Additional Factors Info  Code Status Code Status Info: Full Allergies Info: Codeine           Current Medications (05/25/2015): Current Facility-Administered Medications  Medication Dose Route Frequency Provider Last Rate Last Dose  . 0.9 %  sodium chloride infusion   Intravenous Continuous Enid Baas, MD 75 mL/hr at 05/24/15 0952    . 0.9 %  sodium chloride  infusion   Intravenous Continuous Kennedy Bucker, MD 100 mL/hr at 05/24/15 1809    . acetaminophen (TYLENOL) tablet 650 mg  650 mg Oral Q6H PRN Enid Baas, MD       Or  . acetaminophen (TYLENOL) suppository 650 mg  650 mg Rectal Q6H PRN Enid Baas, MD      . alum & mag hydroxide-simeth (MAALOX/MYLANTA) 200-200-20 MG/5ML suspension 30 mL  30 mL Oral Q4H PRN Kennedy Bucker, MD      . atenolol (TENORMIN) tablet 50 mg  50 mg Oral Daily Enid Baas, MD   50 mg at 05/25/15 0749  . bisacodyl (DULCOLAX) suppository 10 mg  10 mg Rectal Daily PRN Enid Baas, MD      . diphenhydrAMINE (BENADRYL) 12.5 MG/5ML elixir 12.5-25 mg  12.5-25 mg Oral Q4H PRN Kennedy Bucker, MD      . docusate sodium (COLACE) capsule 100 mg  100 mg Oral BID Enid Baas, MD   100 mg at 05/25/15 0749  . enoxaparin (LOVENOX) injection 40 mg  40 mg Subcutaneous Q24H Kennedy Bucker, MD   40 mg at 05/25/15 0748  . fentaNYL (SUBLIMAZE) injection 25-50 mcg  25-50 mcg Intravenous Q5 min PRN Rosaria Ferries, MD      . Influenza vac split quadrivalent PF (FLUARIX) injection 0.5 mL  0.5 mL Intramuscular Tomorrow-1000 Katha Hamming, MD      . levothyroxine (SYNTHROID, LEVOTHROID) tablet 100 mcg  100 mcg Oral QAC breakfast Radhika  Kalisetti, MD   100 mcg at 05/25/15 0749  . lisinopril (PRINIVIL,ZESTRIL) tablet 10 mg  10 mg Oral Daily Enid Baas, MD   10 mg at 05/25/15 0803  . loratadine (CLARITIN) tablet 10 mg  10 mg Oral Daily Enid Baas, MD   10 mg at 05/25/15 0750  . magnesium citrate solution 1 Bottle  1 Bottle Oral Once PRN Kennedy Bucker, MD      . magnesium hydroxide (MILK OF MAGNESIA) suspension 30 mL  30 mL Oral Daily PRN Kennedy Bucker, MD      . menthol-cetylpyridinium (CEPACOL) lozenge 3 mg  1 lozenge Oral PRN Kennedy Bucker, MD       Or  . phenol (CHLORASEPTIC) mouth spray 1 spray  1 spray Mouth/Throat PRN Kennedy Bucker, MD      . morphine 2 MG/ML injection 2 mg  2 mg Intravenous Q1H PRN  Kennedy Bucker, MD      . ondansetron [facility]) tablet 4 mg  4 mg Oral Q6H PRN Enid Baas, MD       Or  . ondansetron (ZOFRAN) injection 4 mg  4 mg Intravenous Q6H PRN Enid Baas, MD      . oxyCODONE (Oxy IR/ROXICODONE) immediate release tablet 5 mg  5 mg Oral Once PRN Rosaria Ferries, MD       Or  . oxyCODONE (ROXICODONE) 5 MG/5ML solution 5 mg  5 mg Oral Once PRN Rosaria Ferries, MD      . oxyCODONE (Oxy IR/ROXICODONE) immediate release tablet 5-10 mg  5-10 mg Oral Q3H PRN Kennedy Bucker, MD      . polyethylene glycol (MIRALAX / GLYCOLAX) packet 17 g  17 g Oral Daily PRN Enid Baas, MD      . pravastatin (PRAVACHOL) tablet 20 mg  20 mg Oral QHS Enid Baas, MD   20 mg at 05/24/15 2101  . primidone (MYSOLINE) tablet 250 mg  250 mg Oral TID Enid Baas, MD   250 mg at 05/25/15 0748  . sodium chloride 0.9 % injection 3 mL  3 mL Intravenous Q12H Enid Baas, MD   3 mL at 05/23/15 2205  . traMADol (ULTRAM) tablet 50 mg  50 mg Oral Q6H PRN Enid Baas, MD      . Vitamin D (Ergocalciferol) (DRISDOL) capsule 50,000 Units  50,000 Units Oral Q30 days Enid Baas, MD   0 Units at 05/23/15 2205   Do not use this list as official medication orders. Please verify with discharge summary.  Discharge Medications:   Medication List    ASK your doctor about these medications        acetaminophen 650 MG CR tablet  Commonly known as:  TYLENOL  Take 650 mg by mouth 2 (two) times daily.     alendronate 70 MG tablet  Commonly known as:  FOSAMAX  Take 70 mg by mouth once a week. On Monday     atenolol 50 MG tablet  Commonly known as:  TENORMIN  Take 50 mg by mouth daily.     carbidopa-levodopa 10-100 MG tablet  Commonly known as:  SINEMET IR  Take 1 tablet by mouth 2 (two) times daily.     carbidopa-levodopa 10-100 MG tablet  Commonly known as:  SINEMET IR  Take 1 tablet by mouth every morning. To make a total morning dose of 2 tablets for 23  days     ketotifen 0.025 % ophthalmic solution  Commonly known as:  ZADITOR  Place 1 drop into both  eyes daily.     levothyroxine 100 MCG tablet  Commonly known as:  SYNTHROID, LEVOTHROID  Take 100 mcg by mouth daily.     lisinopril 10 MG tablet  Commonly known as:  PRINIVIL,ZESTRIL  Take 10 mg by mouth daily.     liver oil-zinc oxide 40 % ointment  Commonly known as:  DESITIN  Apply 1 application topically as needed for irritation (after toileting).     loperamide 2 MG tablet  Commonly known as:  IMODIUM A-D  Take 2 mg by mouth 4 (four) times daily as needed for diarrhea or loose stools.     loratadine 10 MG tablet  Commonly known as:  CLARITIN  Take 10 mg by mouth daily.     pravastatin 20 MG tablet  Commonly known as:  PRAVACHOL  Take 20 mg by mouth at bedtime.     primidone 250 MG tablet  Commonly known as:  MYSOLINE  Take 250 mg by mouth 3 (three) times daily. At 8am, 1pm, and bedtime     traMADol 50 MG tablet  Commonly known as:  ULTRAM  Take 50 mg by mouth 3 (three) times daily as needed for moderate pain or severe pain.     trospium 20 MG tablet  Commonly known as:  SANCTURA  Take 20 mg by mouth daily.     Vitamin D (Ergocalciferol) 50000 UNITS Caps capsule  Commonly known as:  DRISDOL  Take 50,000 Units by mouth every 30 (thirty) days. Once a month on the first Monday        Relevant Imaging Results:  Relevant Lab Results:  Recent Labs    Additional Information SS: [id number]  York SpanielMonica Constantinos Krempasky, LCSW

## 2015-05-25 NOTE — Progress Notes (Signed)
Physical Therapy Evaluation Patient Details Name: Brandy DressSarah Oconnor MRN: [id number] DOB: 28-Mar-1931 Today's Date: 05/25/2015   History of Present Illness  Pt is a 81 yo female with essential tremors of the face and head who was admitted on 05/23/2015 with R femoral neck fracture and is s/p R anterior hip replacement.  Pt is a poor historian, but per chart pt lives alone, uses a RW at baseline, and just entered [facility] program.  Clinical Impression  Pt presents with decreased cognition, lethargy, decreased functional mobility and balance, decreased transfers, and inability to walk.  Pt requires Mod A +2 for bed mobility and transfers and is able to stand for 1 min. Holding RW with Mod A +2.  Pt unable to side step this date.  Pt would benefit from acute PT services to address objective findings.    Follow Up Recommendations SNF    Equipment Recommendations  Other (comment) (deferred to next setting)    Recommendations for Other Services OT consult     Precautions / Restrictions Precautions Precautions: Anterior Hip;Fall Precaution Booklet Issued: No Restrictions Weight Bearing Restrictions: Yes RLE Weight Bearing: Weight bearing as tolerated      Mobility  Bed Mobility Overal bed mobility: +2 for physical assistance             General bed mobility comments: Supine<>short sit edge of bed with Mod A +2; initial poor posterior requiring mod A to maintain; progressed to CGA.  Transfers Overall transfer level: Needs assistance Equipment used: Rolling walker (2 wheeled) Transfers: Sit to/from Stand Sit to Stand: Mod assist;+2 physical assistance         General transfer comment: extra time and cues to push up into standng; verbal and tactile cues for hand placement  Ambulation/Gait Ambulation/Gait assistance: Mod assist;+2 physical assistance Ambulation Distance (Feet): 0 Feet Assistive device: Rolling walker (2 wheeled)       General Gait Details: Attempted side-steping,  unable to weight shift or move feet.  Stood for about 1 minute with Mod A +2 and posterior legs bracing against bed.  Stairs            Wheelchair Mobility    Modified Rankin (Stroke Patients Only)       Balance Overall balance assessment: Needs assistance;History of Falls Sitting-balance support: Bilateral upper extremity supported;Feet supported   Sitting balance - Comments: Progressed from Mod A to CGA.   Standing balance support: Bilateral upper extremity supported   Standing balance comment: Mod A +2, bracing with B UE against bed; increased R lean with fatigue.                             Pertinent Vitals/Pain Pain Assessment:  (grimmaces with movement)    Home Living Family/patient expects to be discharged to:: Skilled nursing facility                      Prior Function Level of Independence: Independent with assistive device(s)         Comments: Pt reports she lives with daughter, but not verified; no family at bedside.     Hand Dominance   Dominant Hand: Left    Extremity/Trunk Assessment   Upper Extremity Assessment: Generalized weakness;Difficult to assess due to impaired cognition           Lower Extremity Assessment: Generalized weakness;Difficult to assess due to impaired cognition         Communication  Communication: No difficulties  Cognition Arousal/Alertness: Lethargic;Suspect due to medications Behavior During Therapy: [facility] for tasks assessed/performed Overall Cognitive Status: Impaired/Different from baseline Area of Impairment: Orientation;Attention;Following commands;Safety/judgement;Awareness Orientation Level: Disoriented to;Situation Current Attention Level: Alternating Memory: Decreased short-term memory Following Commands: Follows one step commands inconsistently;Follows one step commands with increased time Safety/Judgement: Decreased awareness of safety Awareness: Anticipatory         General Comments      Exercises General Exercises - Lower Extremity Ankle Circles/Pumps: AAROM;Both;10 reps;Supine Heel Slides: AAROM;Both;10 reps;Supine      Assessment/Plan    PT Assessment Patient needs continued PT services  PT Diagnosis Generalized weakness;Acute pain;Difficulty walking   PT Problem List Decreased strength;Decreased activity tolerance;Decreased balance;Decreased mobility;Decreased cognition;Decreased knowledge of use of DME;Decreased safety awareness;Decreased knowledge of precautions;Pain  PT Treatment Interventions DME instruction;Gait training;Functional mobility training;Therapeutic activities;Therapeutic exercise;Balance training;Patient/family education   PT Goals (Current goals can be found in the Care Plan section) Acute Rehab PT Goals Patient Stated Goal: none stated PT Goal Formulation: Patient unable to participate in goal setting    Frequency BID   Barriers to discharge Decreased caregiver support      Co-evaluation PT/OT/SLP Co-Evaluation/Treatment: Yes Reason for Co-Treatment: For patient/therapist safety;Complexity of the patient's impairments (multi-system involvement) PT goals addressed during session: Mobility/safety with mobility;Balance OT goals addressed during session: ADL's and self-care       End of Session Equipment Utilized During Treatment: Gait belt Activity Tolerance: Patient limited by fatigue;Patient limited by pain Patient left: in bed (x-ray in room) Nurse Communication: Mobility status         Time: 4098-1191 PT Time Calculation (min) (ACUTE ONLY): 35 min   Charges:   PT Evaluation $Initial PT Evaluation Tier I: 1 Procedure PT Treatments $Therapeutic Activity: 8-22 mins   PT G Codes:        Jedd Schulenburg A Malaney Mcbean Jun 14, 2015, 11:55 AM

## 2015-05-25 NOTE — Evaluation (Signed)
Occupational Therapy Evaluation Patient Details Name: Brandy Oconnor MRN: [id number] DOB: [birth year] Today's Date: 05/25/2015    History of Present Illness Pt is a 81 yo female with essential tremors of the face and head who was admitted on 05/23/2015 with R femoral neck fracture and is s/p R anterior hip replacement.  Pt is a poor historian, but per chart pt lives alone, uses a RW at baseline, and just entered [facility] program.   Clinical Impression   Patient was with PT when OT arrived. Co-eval due to safety. Patient unable to give accurate PLOF. Difficulty with bed mobility, and required MOD A balance initially. Was eventually able to maintain sitting balance EOB with Min Guard, but required use of BUE to maintain balance. Extra cues and time needed to follow 1-step commands. Lethargic, and needed frequent cues to open eyes. Stood with Mod x2 assist. Difficulty with taking a few side steps, and needed frequent tactile/verbal cues to remain upright. Attempted to lay head on OT's shoulder when fatigued. Physical assist for balance and verbal/tactile cues to assess ROM, difficulty following commands to accurately assess strength.    Follow Up Recommendations  SNF    Equipment Recommendations       Recommendations for Other Services       Precautions / Restrictions Precautions Precautions: Anterior Hip;Fall Precaution Booklet Issued: No Restrictions Weight Bearing Restrictions: Yes RLE Weight Bearing: Weight bearing as tolerated      Mobility Bed Mobility Overal bed mobility: +2 for physical assistance             General bed mobility comments: Supine<>short sit edge of bed with Mod A +2; initial poor posterior requiring mod A to maintain; progressed to CGA.  Transfers Overall transfer level: Needs assistance Equipment used: Rolling walker (2 wheeled) Transfers: Sit to/from Stand Sit to Stand: Mod assist;+2 physical assistance         General transfer comment: extra time  and cues to push up into standng; verbal and tactile cues for hand placement; physical and verbal cuing for upright position.    Balance Overall balance assessment: Needs assistance;History of Falls Sitting-balance support: Bilateral upper extremity supported;Feet supported   Sitting balance - Comments: Progressed from Mod A to CGA.   Standing balance support: Bilateral upper extremity supported   Standing balance comment: Mod A x2; bracing against bed; tactile and verbal cuing to tuck bottom and stand upright; increased lean to right with fatigue                            ADL Overall ADL's : Needs assistance/impaired                                       General ADL Comments: Difficulty with sitting balance and following 1 step commands consistently.      Vision     Perception     Praxis      Pertinent Vitals/Pain Pain Assessment:  (Pain noted with movement but was unable to rate.)     Hand Dominance Left   Extremity/Trunk Assessment Upper Extremity Assessment Upper Extremity Assessment: Generalized weakness;Difficult to assess due to impaired cognition   Lower Extremity Assessment Lower Extremity Assessment: Generalized weakness;Difficult to assess due to impaired cognition;Defer to PT evaluation       Communication Communication Communication: No difficulties   Cognition Arousal/Alertness: Lethargic;Suspect due to medications  Behavior During Therapy: WFL for tasks assessed/performed Overall Cognitive Status: Impaired/Different from baseline Area of Impairment: Orientation;Attention;Following commands;Safety/judgement;Awareness Orientation Level: Disoriented to;Situation;Place;Time Current Attention Level: Alternating Memory: Decreased short-term memory Following Commands: Follows one step commands inconsistently;Follows one step commands with increased time Safety/Judgement: Decreased awareness of safety Awareness: Anticipatory        General Comments       Exercises Exercises: General Lower Extremity     Shoulder Instructions      Home Living Family/patient expects to be discharged to:: Skilled nursing facility                                        Prior Functioning/Environment Level of Independence:  (Poor historian. Per chart able to ambulate short distance with rolling walker. No info on ADL PLOF.)        Comments: Pt reports she lives with daughter, but not verified; no family at bedside. Poor historian    OT Diagnosis: Generalized weakness;Cognitive deficits   OT Problem List: Decreased strength;Decreased activity tolerance;Decreased cognition;Decreased safety awareness;Pain;Impaired balance (sitting and/or standing)   OT Treatment/Interventions: Self-care/ADL training;Therapeutic activities;Therapeutic exercise;Patient/family education;DME and/or AE instruction;Balance training    OT Goals(Current goals can be found in the care plan section) Acute Rehab OT Goals Patient Stated Goal: none stated Potential to Achieve Goals: Fair  OT Frequency: Min 1X/week   Barriers to D/C:            Co-evaluation PT/OT/SLP Co-Evaluation/Treatment: Yes Reason for Co-Treatment: For patient/therapist safety;Complexity of the patient's impairments (multi-system involvement) PT goals addressed during session: Mobility/safety with mobility;Balance OT goals addressed during session: ADL's and self-care      End of Session Equipment Utilized During Treatment: Gait belt;Rolling walker  Activity Tolerance: Patient limited by lethargy Patient left: in bed;Other (comment) (left with DentistX-ray technician. Educated to set bed alarm when they leave. )   Time: 1100-1118 OT Time Calculation (min): 18 min Charges:  OT General Charges $OT Visit: 1 Procedure OT Evaluation $Initial OT Evaluation Tier I: 1 Procedure G-Codes:    Brandy Oconnor L 05/25/2015, 12:12 PM Brandy Oconnor, OTR/L

## 2015-05-25 NOTE — Care Management Note (Signed)
Case Management Note  Patient Details  Name: Nicoletta DressSarah Mccullum MRN: [id number] Date of Birth: 05-Nov-1930  Subjective/Objective:      A Case Management Consult was received for Ms Stevie KernDiggs whose current discharge plan is for SNF. Case Management will follow if Ms Stevie KernDiggs is not discharged to SNF.               Action/Plan:   Expected Discharge Date:                  Expected Discharge Plan:     In-House Referral:     Discharge planning Services     Post Acute Care Choice:    Choice offered to:     DME Arranged:    DME Agency:     HH Arranged:    HH Agency:     Status of Service:     Medicare Important Message Given:    Date Medicare IM Given:    Medicare IM give by:    Date Additional Medicare IM Given:    Additional Medicare Important Message give by:     If discussed at Long Length of Stay Meetings, dates discussed:    Additional Comments:  Kylei Purington A, RN 05/25/2015, 4:24 PM

## 2015-05-25 NOTE — Progress Notes (Signed)
Spoke to Dr. Luberta MutterKonidena regarding abnormal UA results. She asked that I put in order for rocephin 1g IV daily.

## 2015-05-25 NOTE — Progress Notes (Signed)
   Subjective: 1 Day Post-Op Procedure(s) (LRB): TOTAL HIP ARTHROPLASTY ANTERIOR APPROACH (Right) Patient reports pain as 0 on 0-10 scale.   Patient is well, and has had no acute complaints or problems We will start therapy today.  Plan is to go Rehab after hospital stay. no nausea and no vomiting Patient denies any chest pains or shortness of breath. Objective: Vital signs in last 24 hours: Temp:  [97.4 F (36.3 C)-100.1 F (37.8 C)] 97.4 F (36.3 C) (11/12 0421) Pulse Rate:  [79-98] 97 (11/12 0421) Resp:  [13-18] 18 (11/12 0421) BP: (121-152)/(49-81) 149/59 mmHg (11/12 0421) SpO2:  [90 %-100 %] 98 % (11/12 0421) Weight:  [64.864 kg (143 lb)] 64.864 kg (143 lb) (11/11 1751) well approximated incision Heels are non tender and elevated off the bed using rolled towels Intake/Output from previous day: 11/11 0701 - 11/12 0700 In: 1735 [I.V.:1735] Out: 1220 [Urine:1120; Blood:100] Intake/Output this shift:     Recent Labs  05/23/15 1845 05/24/15 0600  HGB 15.1 13.1    Recent Labs  05/23/15 1845 05/24/15 0600  WBC 10.3 8.4  RBC 4.58 3.96  HCT 44.2 38.8  PLT 223 186    Recent Labs  05/24/15 0600 05/25/15 0315  NA 137 138  K 4.2 3.7  CL 105 109  CO2 23 22  BUN 16 10  CREATININE 0.93 0.65  GLUCOSE 111* 115*  CALCIUM 8.2* 7.6*    Recent Labs  05/23/15 1845  INR 1.03    EXAM General - Patient is Alert, Appropriate and Oriented Extremity - Neurologically intact Neurovascular intact Sensation intact distally Intact pulses distally Dressing - scant drainage Motor Function - intact, moving foot and toes well on exam.    Past Medical History  Diagnosis Date  . Hyperlipidemia   . Depression   . Osteoarthritis   . Hypothyroidism   . Hypertension   . GERD (gastroesophageal reflux disease)   . Femur fracture, right (HCC)   . Tremor     benign essential tremor- tremors of voice, head and jaw  . Dementia     Assessment/Plan: 1 Day Post-Op  Procedure(s) (LRB): TOTAL HIP ARTHROPLASTY ANTERIOR APPROACH (Right) Active Problems:   Hip fracture (HCC)  Estimated body mass index is 27.03 kg/(m^2) as calculated from the following:   Height as of this encounter: 5\' 1"  (1.549 m).   Weight as of this encounter: 64.864 kg (143 lb). Advance diet Up with therapy D/C IV fluids Discharge to SNF on monday  Labs: reviewed . hgb not back yet DVT Prophylaxis - Lovenox, Foot Pumps and TED hose Weight-Bearing as tolerated to right leg D/C O2 and Pulse OX and try on Room Air Begin working on a bowel movement   Osborn Pullin R. [facility] PA [facility] 05/25/2015, 7:10 AM

## 2015-05-25 NOTE — Clinical Social Work Note (Signed)
CSW spoke with patient's daughter, Boyd Kerbsenny, this afternoon in patient's hospital room. Patient's daughter has come to stay with patient indefinitely and states she [place] with [place]. She is currently living with her patient in patient's home. Boyd Kerbsenny stated that she has not been pleased with the [facility] program and believes that if the PACE program had been doing what was needed, that her mother would not be in her current medical condition. Patient's daughter is contemplating disenrolling patient from the [facility] program and converting her back to medicare. CSW discussed discharge planning with her and if she would be open to considering STR for her mother. Patient's daughter is open to this and recognizes that she cannot lift her mother. Patient's daughter also stated that she is aware that [facility] and is willing for CSW to send her [facility]. Referral initiated today. York SpanielMonica Anureet Bruington MSW,LCSW [phone]

## 2015-05-25 NOTE — Plan of Care (Signed)
Problem: Education: Goal: Verbalization of understanding the information provided (i.e., activity precautions, restrictions, etc) will improve Outcome: Not Applicable Date Met:  97/67/34 Patient has dementia and is unable to understand/maintain teaching/education.

## 2015-05-25 NOTE — Progress Notes (Signed)
Physical Therapy Treatment Patient Details Name: Brandy Oconnor MRN: [id number] DOB: 1931-02-14 Today's Date: 05/25/2015    History of Present Illness Pt is a 81 yo female with essential tremors of the face and head who was admitted on 05/23/2015 with R femoral neck fracture and is s/p R anterior hip replacement.  Pt is a poor historian, but per chart pt lives alone, uses a RW at baseline, and just entered [facility] program.    PT Comments    Pt continues to require stimulation throughout session to keep eyes open and to engage with therapist.  Pt able to follow one step commands about 75% of the time.  Pt required Mod A for sit<>stand transfers but continues to require Mod A +2 for bed mobility.  Pt progressing towards goals, cont with POC.   Follow Up Recommendations  SNF     Equipment Recommendations       Recommendations for Other Services       Precautions / Restrictions Precautions Precautions: Anterior Hip;Fall Restrictions Weight Bearing Restrictions: Yes RLE Weight Bearing: Weight bearing as tolerated    Mobility  Bed Mobility Overal bed mobility: +2 for physical assistance             General bed mobility comments: Supine to sit with Max A +1 and sit to supine with Mod A +2  Transfers Overall transfer level: Needs assistance Equipment used: Rolling walker (2 wheeled) Transfers: Sit to/from Stand Sit to Stand: Mod assist         General transfer comment: Pusing up with B UE's on bed; physical assist to transfer hands from bed to RW;    Ambulation/Gait Ambulation/Gait assistance: Mod assist Ambulation Distance (Feet): 0 Feet Assistive device: Rolling walker (2 wheeled)       General Gait Details: Standing at EOB x 1 min with Mod A for balance with increased posterior lean and decreased trunk extension.   Stairs            Wheelchair Mobility    Modified Rankin (Stroke Patients Only)       Balance Overall balance assessment: Needs  assistance Sitting-balance support: Bilateral upper extremity supported;Feet supported   Sitting balance - Comments: Progressed from Mod A to CGA.   Standing balance support: Bilateral upper extremity supported   Standing balance comment: Mod A                     Cognition Arousal/Alertness: Lethargic Behavior During Therapy: WFL for tasks assessed/performed Overall Cognitive Status: Impaired/Different from baseline Area of Impairment: Orientation;Attention;Following commands;Safety/judgement;Awareness Orientation Level: Disoriented to;Situation;Place;Time Current Attention Level: Alternating Memory: Decreased short-term memory Following Commands: Follows one step commands inconsistently;Follows one step commands with increased time Safety/Judgement: Decreased awareness of safety Awareness: Anticipatory        Exercises General Exercises - Lower Extremity Ankle Circles/Pumps: AAROM;Both;10 reps;Supine Quad Sets: AROM;Both;10 reps Heel Slides: AAROM;Both;10 reps;Supine Hip ABduction/ADduction: AAROM;Both;10 reps    General Comments        Pertinent Vitals/Pain Pain Assessment:  (Pain noted with movement but was unable to rate.)    Home Living Family/patient expects to be discharged to:: Skilled nursing facility                    Prior Function Level of Independence:  (Poor historian. Per chart able to ambulate short distance with rolling walker. No info on ADL PLOF.)      Comments: Pt reports she lives with daughter, but not verified; no family  at bedside. Poor historian   PT Goals (current goals can now be found in the care plan section) Acute Rehab PT Goals Patient Stated Goal: none stated PT Goal Formulation: Patient unable to participate in goal setting    Frequency  BID (as tolerated)    PT Plan      Co-evaluation   Reason for Co-Treatment: For patient/therapist safety;Complexity of the patient's impairments (multi-system  involvement) PT goals addressed during session: Mobility/safety with mobility;Balance OT goals addressed during session: ADL's and self-care     End of Session Equipment Utilized During Treatment: Gait belt Activity Tolerance: Patient limited by fatigue;Patient limited by pain Patient left: in bed;with family/visitor present;with SCD's reapplied;with call bell/phone within reach     Time: 1445-1515 PT Time Calculation (min) (ACUTE ONLY): 30 min  Charges:  $Therapeutic Exercise: 8-22 mins $Therapeutic Activity: 8-22 mins                    G Codes:      Brandy Oconnor A Kourosh Jablonsky 05/25/2015, 3:18 PM

## 2015-05-25 NOTE — Progress Notes (Signed)
[facility] - [place] at [facility]   PATIENT NAME: Brandy Oconnor    MR#:  [id number]  DATE OF BIRTH:  [birth year]  SUBJECTIVE: 81 year old female patient with dementia admitted for fall and noted to have right femoral neck fracture. S/p post right hip hemiarthroplasty yesterday. Patient denies any complaints.   CHIEF COMPLAINT:   Chief Complaint  Patient presents with  . Leg Injury    Right Femur Fracutre    REVIEW OF SYSTEMS:    Review of Systems  Constitutional: Negative for fever and chills.  HENT: Negative for hearing loss.   Eyes: Negative for blurred vision, double vision and photophobia.  Respiratory: Negative for cough, hemoptysis and shortness of breath.   Cardiovascular: Negative for palpitations, orthopnea and leg swelling.  Gastrointestinal: Negative for heartburn, vomiting, abdominal pain and diarrhea.  Genitourinary: Negative for dysuria and urgency.  Musculoskeletal: Negative for myalgias and neck pain.       Right hip pain.  Skin: Negative for rash.  Neurological: Negative for dizziness, focal weakness, seizures, weakness and headaches.  Endo/Heme/Allergies: Does not bruise/bleed easily.  Psychiatric/Behavioral: Negative for memory loss. The patient does not have insomnia.     Nutrition:  Tolerating Diet: Tolerating PT:      DRUG ALLERGIES:   Allergies  Allergen Reactions  . Codeine Rash    VITALS:  Blood pressure 140/62, pulse 97, temperature 100.6 F (38.1 C), temperature source Oral, resp. rate 18, height  (1.549 m), weight 64.864 kg (143 lb), SpO2 92 %.  PHYSICAL EXAMINATION:   Physical Exam  GENERAL:  81 y.o.-year-old patient lying in the bed with no acute distress.  EYES: Pupils equal, round, reactive to light and accommodation. No scleral icterus. Extraocular muscles intact.  HEENT: Head atraumatic, normocephalic. Oropharynx and nasopharynx clear.  NECK:  Supple, no jugular venous distention. No thyroid  enlargement, no tenderness.  LUNGS: Normal breath sounds bilaterally, no wheezing, rales,rhonchi or crepitation. No use of accessory muscles of respiration.  CARDIOVASCULAR: S1, S2 normal. No murmurs, rubs, or gallops.  ABDOMEN: Soft, nontender, nondistended. Bowel sounds present. No organomegaly or mass.  EXTREMITIES:  Right hip dressing present,. Neurovascular intact. Intact distal pulses. NEUROLOGIC: Cranial nerves II through XII are intact. Muscle strength 5/5 in all extremities. Sensation intact. Gait not checked.  PSYCHIATRIC: The patient is alert and oriented x 3.  SKIN: No obvious rash, lesion, or ulcer.    LABORATORY PANEL:   CBC  Recent Labs Lab 05/24/15 0600  WBC 8.4  HGB 13.1  HCT 38.8  PLT 186   ------------------------------------------------------------------------------------------------------------------  Chemistries   Recent Labs Lab 05/23/15 1845  05/25/15 0315  NA 136  < > 138  K 4.2  < > 3.7  CL 100*  < > 109  CO2 27  < > 22  GLUCOSE 104*  < > 115*  BUN 14  < > 10  CREATININE 0.83  < > 0.65  CALCIUM 9.1  < > 7.6*  AST 19  --   --   ALT 19  --   --   ALKPHOS 80  --   --   BILITOT 0.2*  --   --   < > = values in this interval not displayed. ------------------------------------------------------------------------------------------------------------------  Cardiac Enzymes No results for input(s): TROPONINI in the last 168 hours. ------------------------------------------------------------------------------------------------------------------  RADIOLOGY:  Dg Chest 1 View  05/23/2015  CLINICAL DATA:  Preoperative for right femoral neck fracture ORIF EXAM: CHEST 1 VIEW COMPARISON:  10/21/2012 chest radiograph. FINDINGS: Stable  cardiomediastinal silhouette with normal heart size. No pneumothorax. No pleural effusion. Clear lungs, with no focal lung consolidation and no pulmonary edema. Cholecystectomy clip in the right upper quadrant of the abdomen.  IMPRESSION: No active disease. Electronically Signed   By: Delbert PhenixJason A Poff M.D.   On: 05/23/2015 19:00   Dg Hip Operative Unilat With Pelvis Right  05/24/2015  CLINICAL DATA:  Right hip arthroplasty. EXAM: OPERATIVE RIGHT HIP (WITH PELVIS IF PERFORMED) 1 VIEWS TECHNIQUE: Fluoroscopic spot image(s) were submitted for interpretation post-operatively. COMPARISON:  None. FINDINGS: Single view of the right hip arthroplasty shows the components to be well seated and aligned. The inferior aspect of the femoral component is not included on the field of view. No evidence of an acute fracture or operative complication. IMPRESSION: Operative image from right hip arthroplasty. Electronically Signed   By: Amie Portlandavid  Ormond M.D.   On: 05/24/2015 16:04   Dg Hip Unilat W Or W/o Pelvis 2-3 Views Right  05/24/2015  CLINICAL DATA:  New right hip replacement. EXAM: DG HIP (WITH OR WITHOUT PELVIS) 2-3V RIGHT COMPARISON:  05/23/2015. FINDINGS: Total right hip replacement. Hardware intact. No acute bony abnormality. IMPRESSION: Total right hip replacement with good anatomic alignment. Electronically Signed   By: Maisie Fushomas  Register   On: 05/24/2015 16:52   Dg Hip Unilat With Pelvis 2-3 Views Right  05/23/2015  CLINICAL DATA:  Right hip pain after fall EXAM: DG HIP (WITH OR WITHOUT PELVIS) 2-3V RIGHT COMPARISON:  None. FINDINGS: There is a comminuted mid right femoral neck fracture with mild apex lateral angulation, 1 cm superior displacement of the dominant distal fracture fragment and mild impaction. No dislocation at the right hip joint. No additional fracture. No suspicious focal osseous lesion. Diffuse osteopenia. IMPRESSION: Comminuted right mid femoral neck fracture as described. Electronically Signed   By: Delbert PhenixJason A Poff M.D.   On: 05/23/2015 18:58     ASSESSMENT AND PLAN:   Active Problems:   Hip fracture (HCC)   #1. Right femoral neck fracture with comminution; initial encounter; status post right hip hemiarthroplasty.  Started with Lovenox. Work with physical therapy today  #2. Hyperlipidemia continue statins 3 .hypertension controlled continue atenolol, lisinopril 4. Hypothyroidism continue Synthroid. #5 benign essential tremors continue primidone. GI, DVT prophylaxis CODE STATUS; full code Low-grade temperature this morning: Check chest x-ray, urine analysis.      All the records are reviewed and case discussed with Care Management/Social Workerr. Management plans discussed with the patient, family and they are in agreement.  CODE STATUS: full  TOTAL TIME TAKING CARE OF THIS PATIENT: 35 minutes.   POSSIBLE D/C IN 3-4DAYS, DEPENDING ON CLINICAL CONDITION.   Katha HammingKONIDENA,Tashan Kreitzer M.D on 05/25/2015 at 9:41 AM  Between 7am to 6pm - Pager - [phone]  After 6pm go to [email or web address] - password [email or web address] [facility]  [facility]  Office  [phone]  CC: Primary care physician; No primary care provider on file.

## 2015-05-25 NOTE — Clinical Social Work Note (Signed)
Clinical Social Work Assessment  Patient Details  Name: Brandy DressSarah Oconnor MRN: [id number] Date of Birth: 12-10-1930  Date of referral:  05/25/15               Reason for consult:  Facility Placement                Permission sought to share information with:  Family Supports Permission granted to share information::  Yes, Verbal Permission Granted  Name::        Agency::     Relationship::     Contact Information:     Housing/Transportation Living arrangements for the past 2 months:  Single Family Home Source of Information:  Patient Patient Interpreter Needed:  None Criminal Activity/Legal Involvement Pertinent to Current Situation/Hospitalization:  No - Comment as needed Significant Relationships:  Adult Children Lives with:    Do you feel safe going back to the place where you live?    Need for family participation in patient care:     Care giving concerns:  Patient states she lives with her husband.   Social Worker assessment / plan:  CSW received consult due to patient having a hip fracture and having surgery yesterday. PT has not yet assessed patient and made recommendations. Patient is a [facility] recipient so if she were to require short term rehab, [facility]. CSW spoke with patient in her hospital room. Patient was very slow to respond but when she did respond her answers were appropriate. Unsure of accuracy due to history of dementia. Patient states she lives with her husband and that PACE helps her with medications, etc. Patient stated that she would be willing to consider short term rehab if recommended and would be okay with [facility]. Patient's nurse stated that she has a corrected number for patient's daughter: Boyd Kerbsenny and it is: [phone]. CSW attempted to reach this number and was initially unable to get through but then when this writer did get through, the number was a wrong number and stated it was to a [place] apartment complex.  CSW has asked patient's nurse to contact CSW if patient's daughter arrives.  Employment status:  Retired Database administratornsurance information:  Managed Medicare (PACE) PT Recommendations:  Skilled Nursing Facility Information / Referral to community resources:     Patient/Family's Response to care:  Patient very pleasant and cooperative.  Patient/Family's Understanding of and Emotional Response to Diagnosis, Current Treatment, and Prognosis:  Unclear of how much patient was able to understand as she was lethargic and slow to respond.  Emotional Assessment Appearance:  Appears stated age Attitude/Demeanor/Rapport:   (pleasant and cooperative) Affect (typically observed):  Accepting, Appropriate, Calm Orientation:  Oriented to Self, Oriented to Place, Fluctuating Orientation (Suspected and/or reported Sundowners) Alcohol / Substance use:  Not Applicable Psych involvement (Current and /or in the community):  No (Comment)  Discharge Needs  Concerns to be addressed:  Care Coordination Readmission within the last 30 days:  No Current discharge risk:  None Barriers to Discharge:  No Barriers Identified   York SpanielMonica Reuven Braver, LCSW 05/25/2015, 10:52 AM

## 2015-05-26 NOTE — Progress Notes (Signed)
Physical Therapy Treatment Patient Details Name: Brandy Oconnor MRN: [id number] DOB: Sep 20, 1930 Today's Date: 05/26/2015    History of Present Illness Pt is an 81 yo female with essential tremors of the face and head who was admitted on 05/23/2015 with R femoral neck fracture and is s/p R anterior hip replacement.  Pt is a poor historian, but per chart pt lives alone, uses a RW at baseline, and just entered PACE program.    PT Comments    Patient is pleasant at time of today's session, requiring orientation to place/time/situation. Patient denies pain at commencement of session. Was able to follow one step commands inconsistently with verbal and tactile cues. Patient able to get to EOB with moderate assistance, primarily for scooting. Unable to take steps in standing due to decreased dynamic balance and increased pain. Unable to rate but declares it as "high." Patient will benefit from progressive balance/pre-gait training, allowing her to weightshift and initiate gait.   Follow Up Recommendations  SNF     Equipment Recommendations  Other (comment)    Recommendations for Other Services       Precautions / Restrictions Precautions Precautions: Anterior Hip;Fall Restrictions Weight Bearing Restrictions: Yes RLE Weight Bearing: Weight bearing as tolerated    Mobility  Bed Mobility Overal bed mobility: Needs Assistance Bed Mobility: Supine to Sit;Sit to Supine     Supine to sit: Mod assist Sit to supine: Total assist   General bed mobility comments: Patient able to move supine to sit with verbal/tactile cues. Demonstrates decreased ability to scoot to EOB. Required total assistance into supine. Patient able to hold trapeze while PT slid patient to [facility].  Transfers Overall transfer level: Needs assistance Equipment used: Rolling walker (2 wheeled) Transfers: Sit to/from Stand Sit to Stand: Mod assist         General transfer comment: Patient required moderate assistance to  move from sit to stand, leaning posteriorly against bed. Required physical assistance to move R hand from bed rail to RW. Patient unable to perform dynamic balance in standing.  Ambulation/Gait                 Stairs            Wheelchair Mobility    Modified Rankin (Stroke Patients Only)       Balance Overall balance assessment: Needs assistance Sitting-balance support: Bilateral upper extremity supported Sitting balance-Leahy Scale: Fair Sitting balance - Comments: Patient posteriorly leans; requires physical assistance to remain upright Postural control: Posterior lean Standing balance support: Bilateral upper extremity supported Standing balance-Leahy Scale: Poor Standing balance comment: Patient demonstrates posterior lean against bed; unable to weightshift with cues.             High level balance activites: Other (comment) (Dynamic reaching in sitting with minA)      Cognition Arousal/Alertness: Awake/alert Behavior During Therapy: WFL for tasks assessed/performed Overall Cognitive Status: History of cognitive impairments - at baseline Area of Impairment: Orientation;Memory;Following commands;Safety/judgement;Awareness Orientation Level: Disoriented to;Place;Time;Situation Current Attention Level: Focused Memory: Decreased recall of precautions;Decreased short-term memory Following Commands: Follows one step commands inconsistently Safety/Judgement: Decreased awareness of safety          Exercises General Exercises - Lower Extremity Ankle Circles/Pumps: AAROM;15 reps Quad Sets: AAROM;10 reps Gluteal Sets: AAROM;10 reps Short Arc Quad: AAROM;10 reps Heel Slides: AAROM;10 reps Hip ABduction/ADduction: AAROM;10 reps    General Comments        Pertinent Vitals/Pain Pain Assessment: No/denies pain    Home Living  Prior Function            PT Goals (current goals can now be found in the care plan section)  Acute Rehab PT Goals Patient Stated Goal: Did not state PT Goal Formulation: Patient unable to participate in goal setting Progress towards PT goals: Progressing toward goals    Frequency  BID    PT Plan Current plan remains appropriate    Co-evaluation             End of Session Equipment Utilized During Treatment: Gait belt Activity Tolerance: Patient tolerated treatment well;Patient limited by pain Patient left: in bed;with bed alarm set;with call bell/phone within reach     Time: 0812-0842 PT Time Calculation (min) (ACUTE ONLY): 30 min  Charges:  $Therapeutic Exercise: 8-22 mins $Therapeutic Activity: 8-22 mins                    G Codes:      Neita CarpJulie Ann Jerran Tappan, PT, DPT 05/26/2015, 10:39 AM

## 2015-05-26 NOTE — Progress Notes (Signed)
Patient's telemetry d/c'd

## 2015-05-26 NOTE — Progress Notes (Signed)
[facility] [place] [facility]   PATIENT NAME: Brandy DressSarah Oconnor    MR#:  [id number]  DATE OF BIRTH:  19-Oct-1930  SUBJECTIVE: 81 year old female patient with dementia admitted for fall and noted to have right femoral neck fracture. S/p post right hip hemiarthroplasty.had fever yesterday,found to have UTI.  CHIEF COMPLAINT:   Chief Complaint  Patient presents with  . Leg Injury    Right Femur Fracutre    REVIEW OF SYSTEMS:    Review of Systems  Constitutional: Negative for fever and chills.  HENT: Negative for hearing loss.   Eyes: Negative for blurred vision, double vision and photophobia.  Respiratory: Negative for cough, hemoptysis and shortness of breath.   Cardiovascular: Negative for palpitations, orthopnea and leg swelling.  Gastrointestinal: Negative for heartburn, vomiting, abdominal pain and diarrhea.  Genitourinary: Negative for dysuria and urgency.  Musculoskeletal: Negative for myalgias and neck pain.       Right hip pain.  Skin: Negative for rash.  Neurological: Negative for dizziness, focal weakness, seizures, weakness and headaches.  Endo/Heme/Allergies: Does not bruise/bleed easily.  Psychiatric/Behavioral: Negative for memory loss. The patient does not have insomnia.     Nutrition:  Tolerating Diet: Tolerating PT:      DRUG ALLERGIES:   Allergies  Allergen Reactions  . Codeine Rash    VITALS:  Blood pressure 132/54, pulse 90, temperature 99.5 F (37.5 C), temperature source Oral, resp. rate 18, height 5\' 1"  (1.549 m), weight 64.864 kg (143 lb), SpO2 93 %.  PHYSICAL EXAMINATION:   Physical Exam  GENERAL:  81 y.o.-year-old patient lying in the bed with no acute distress.  EYES: Pupils equal, round, reactive to light and accommodation. No scleral icterus. Extraocular muscles intact.  HEENT: Head atraumatic, normocephalic. Oropharynx and nasopharynx clear.  NECK:  Supple, no jugular venous distention. No thyroid  enlargement, no tenderness.  LUNGS: Normal breath sounds bilaterally, no wheezing, rales,rhonchi or crepitation. No use of accessory muscles of respiration.  CARDIOVASCULAR: S1, S2 normal. No murmurs, rubs, or gallops.  ABDOMEN: Soft, nontender, nondistended. Bowel sounds present. No organomegaly or mass.  EXTREMITIES:  Right hip dressing present,. Neurovascular intact. Intact distal pulses. NEUROLOGIC: Cranial nerves II through XII are intact. Muscle strength 5/5 in all extremities. Sensation intact. Gait not checked.  PSYCHIATRIC: The patient is alert and oriented x 3.  SKIN: No obvious rash, lesion, or ulcer.    LABORATORY PANEL:   CBC  Recent Labs Lab 05/25/15 0956  WBC 9.8  HGB 11.8*  HCT 35.2  PLT 183   ------------------------------------------------------------------------------------------------------------------  Chemistries   Recent Labs Lab 05/23/15 1845  05/25/15 0315  NA 136  < > 138  K 4.2  < > 3.7  CL 100*  < > 109  CO2 27  < > 22  GLUCOSE 104*  < > 115*  BUN 14  < > 10  CREATININE 0.83  < > 0.65  CALCIUM 9.1  < > 7.6*  AST 19  --   --   ALT 19  --   --   ALKPHOS 80  --   --   BILITOT 0.2*  --   --   < > = values in this interval not displayed. ------------------------------------------------------------------------------------------------------------------  Cardiac Enzymes No results for input(s): TROPONINI in the last 168 hours. ------------------------------------------------------------------------------------------------------------------  RADIOLOGY:  Dg Chest 1 View  05/25/2015  CLINICAL DATA:  Postoperative yesterday total hip revision, now with fever. EXAM: CHEST 1 VIEW COMPARISON:  Chest x-ray dated 05/23/2015. FINDINGS:  Heart size is normal. Overall cardiomediastinal silhouette is stable. Study is slightly hypoinspiratory with associated crowding of the perihilar bronchovascular markings. Given the hypoinspiratory changes, lungs are clear.  No evidence of developing pneumonia. No pleural effusion. No pneumothorax. Incidental note again made of a slightly elevated right hemidiaphragm. No acute osseous abnormality. IMPRESSION: Hypoinspiratory exam. No evidence of acute cardiopulmonary abnormality seen. Electronically Signed   By: Bary Richard M.D.   On: 05/25/2015 11:35   Dg Hip Operative Unilat With Pelvis Right  05/24/2015  CLINICAL DATA:  Right hip arthroplasty. EXAM: OPERATIVE RIGHT HIP (WITH PELVIS IF PERFORMED) 1 VIEWS TECHNIQUE: Fluoroscopic spot image(s) were submitted for interpretation post-operatively. COMPARISON:  None. FINDINGS: Single view of the right hip arthroplasty shows the components to be well seated and aligned. The inferior aspect of the femoral component is not included on the field of view. No evidence of an acute fracture or operative complication. IMPRESSION: Operative image from right hip arthroplasty. Electronically Signed   By: Amie Portland M.D.   On: 05/24/2015 16:04   Dg Hip Unilat W Or W/o Pelvis 2-3 Views Right  05/24/2015  CLINICAL DATA:  New right hip replacement. EXAM: DG HIP (WITH OR WITHOUT PELVIS) 2-3V RIGHT COMPARISON:  05/23/2015. FINDINGS: Total right hip replacement. Hardware intact. No acute bony abnormality. IMPRESSION: Total right hip replacement with good anatomic alignment. Electronically Signed   By: Maisie Fus  Register   On: 05/24/2015 16:52     ASSESSMENT AND PLAN:   Active Problems:   Hip fracture (HCC)   #1. Right femoral neck fracture with comminution; initial encounter; status post right hip hemiarthroplasty. Started with Lovenox. Work with physical therapy today .likely d/c to SNF #2. Hyperlipidemia continue statins 3 .hypertension controlled continue atenolol, lisinopril 4. Hypothyroidism continue Synthroid. #5 benign essential tremors continue primidone. GI, DVT prophylaxis CODE STATUS; full code UTI;STARTED ROCEPHIN,FOLLOW URINE CULTURE    All the records are reviewed  and case discussed with Care Management/Social Workerr. Management plans discussed with the patient, family and they are in agreement.  CODE STATUS: full  TOTAL TIME TAKING CARE OF THIS PATIENT: 35 minutes.   POSSIBLE D/C IN 3-4DAYS, DEPENDING ON CLINICAL CONDITION.   Katha Hamming M.D on 05/26/2015 at 10:16 AM  Between 7am to 6pm - Pager - [phone]  After 6pm go to [email or web address] - password [email or web address] [facility]  [place] [facility] [place] [facility]  Office  [phone]  CC: Primary care physician; No primary care provider on file.

## 2015-05-26 NOTE — Discharge Instructions (Signed)
Patient Name Sex DOB SSN  Brandy Oconnor  @ Feb 21, 1931 [id number]    Discharge Planning by [facility].   Author: Tera Partridge Service: Orthopedics Author Type: Physician Assistant    Filed: @ Note Time: 7:26 AM Status: Signed   Editor: Tera Partridge., PA     Expand All Collapse All   ANTERIOR APPROACH TOTAL HIP REPLACEMENT POSTOPERATIVE DIRECTIONS   Hip Rehabilitation, Guidelines Following Surgery  The results of a hip operation are greatly improved after range of motion and muscle strengthening exercises. Follow all safety measures which are given to protect your hip. If any of these exercises cause increased pain or swelling in your joint, decrease the amount until you are comfortable again. Then slowly increase the exercises. Call your caregiver if you have problems or questions.   HOME CARE INSTRUCTIONS   Remove items at home which could result in a fall. This includes throw rugs or furniture in walking pathways.   ICE to the affected hip every three hours for 30 minutes at a time and then as needed for pain and swelling. Continue to use ice on the hip for pain and swelling from surgery. You may notice swelling that will progress down to the foot and ankle. This is normal after surgery. Elevate the leg when you are not up walking on it.   Continue to use the breathing machine which will help keep your temperature down. It is common for your temperature to cycle up and down following surgery, especially at night when you are not up moving around and exerting yourself. The breathing machine keeps your lungs expanded and your temperature down.  Do not place pillow under knee, focus on keeping the knee straight while resting  DIET You may resume your previous home diet once your are discharged from the hospital.  DRESSING / WOUND CARE / SHOWERING Change the surgical dressing as needed If the wound gets wet inside, change the dressing with sterile gauze. Please use  good hand washing techniques before changing the dressing. Do not use any lotions or creams on the incision until instructed by your surgeon.Keep your dressing dry with showering.  Keep your dressing dry with showering. You can keep it covered and pat dry.  STAPLE REMOVAL: Please remove staples two weeks post op and apply benzoin and 1/2 inch steri strips  ACTIVITY Walk with your walker as instructed. Use walker as long as suggested by your caregivers. Avoid periods of inactivity such as sitting longer than an hour when not asleep. This helps prevent blood clots.  You may resume a sexual relationship in one month or when given the OK by your doctor.  You may return to work once you are cleared by your doctor.  Do not drive a car for 6 weeks or until released by you surgeon.  Do not drive while taking narcotics.  WEIGHT BEARING Weight bearing as tolerated with assist device (walker, cane, etc) as directed, use it as long as suggested by your surgeon or therapist, typically at least 4-6 weeks.  POSTOPERATIVE CONSTIPATION PROTOCOL Constipation - defined medically as fewer than three stools per week and severe constipation as less than one stool per week.  One of the most common issues patients have following surgery is constipation. Even if you have a regular bowel pattern at home, your normal regimen is likely to be disrupted due to multiple reasons following surgery. Combination of anesthesia, postoperative narcotics, change in appetite and fluid intake all can affect your bowels. In  order to avoid complications following surgery, here are some recommendations in order to help you during your recovery period.  Colace (docusate) - Pick up an over-the-counter form of Colace or another stool softener and take twice a day as long as you are requiring postoperative pain medications. Take with a full glass of water daily. If you experience loose stools or diarrhea, hold the colace until  you stool forms back up. If your symptoms do not get better within 1 week or if they get worse, check with your doctor.  Dulcolax (bisacodyl) - Pick up over-the-counter and take as directed by the product packaging as needed to assist with the movement of your bowels. Take with a full glass of water. Use this product as needed if not relieved by Colace only.   MiraLax (polyethylene glycol) - Pick up over-the-counter to have on hand. MiraLax is a solution that will increase the amount of water in your bowels to assist with bowel movements. Take as directed and can mix with a glass of water, juice, soda, coffee, or tea. Take if you go more than two days without a movement. Do not use MiraLax more than once per day. Call your doctor if you are still constipated or irregular after using this medication for 7 days in a row.  If you continue to have problems with postoperative constipation, please contact the office for further assistance and recommendations. If you experience "the worst abdominal pain ever" or develop nausea or vomiting, please contact the office immediatly for further recommendations for treatment.  ITCHING If you experience itching with your medications, try taking only a single pain pill, or even half a pain pill at a time. You can also use Benadryl over the counter for itching or also to help with sleep.   TED HOSE STOCKINGS Wear the elastic stockings on both legs for 6 weeks following surgery during the day but you may remove then at night for sleeping.  MEDICATIONS See your medication summary on the After Visit Summary that the nursing staff will review with you prior to discharge. You may have some home medications which will be placed on hold until you complete the course of blood thinner medication. It is important for you to complete the blood thinner medication as prescribed by your surgeon. Continue your approved medications as instructed at time of  discharge.  PRECAUTIONS If you experience chest pain or shortness of breath - call 911 immediately for transfer to the hospital emergency department.  If you develop a fever greater that 101 F, purulent drainage from wound, increased redness or drainage from wound, foul odor from the wound/dressing, or calf pain - CONTACT YOUR SURGEON.    FOLLOW-UP APPOINTMENTS Make sure you keep all of your appointments after your operation with your surgeon and caregivers. You should call the office at the above phone number and make an appointment for approximately two weeks after the date of your surgery or on the date instructed by your surgeon outlined in the "After Visit Summary".  RANGE OF MOTION AND STRENGTHENING EXERCISES  These exercises are designed to help you keep full movement of your hip joint. Follow your caregiver's or physical therapist's instructions. Perform all exercises about fifteen times, three times per day or as directed. Exercise both hips, even if you have had only one joint replacement. These exercises can be done on a training (exercise) mat, on the floor, on a table or on a bed. Use whatever works the best and is most  comfortable for you. Use music or television while you are exercising so that the exercises are a pleasant break in your day. This will make your life better with the exercises acting as a break in routine you can look forward to.   Lying on your back, slowly slide your foot toward your buttocks, raising your knee up off the floor. Then slowly slide your foot back down until your leg is straight again.   Lying on your back spread your legs as far apart as you can without causing discomfort.   Lying on your side, raise your upper leg and foot straight up from the floor as far as is comfortable. Slowly lower the leg and repeat.   Lying on your back, tighten up the muscle in the front of your thigh (quadriceps muscles).  You can do this by keeping your leg straight and trying to raise your heel off the floor. This helps strengthen the largest muscle supporting your knee.   Lying on your back, tighten up the muscles of your buttocks both with the legs straight and with the knee bent at a comfortable angle while keeping your heel on the floor.  IF YOU ARE TRANSFERRED TO A SKILLED REHAB FACILITY If the patient is transferred to a skilled rehab facility following release from the hospital, a list of the current medications will be sent to the facility for the patient to continue. When discharged from the skilled rehab facility, please have the facility set up the patient's [place] prior to being released. Also, the skilled facility will be responsible for providing the patient with their medications at time of release from the facility to include their pain medication, the muscle relaxants, and their blood thinner medication. If the patient is still at the rehab facility at time of the two week follow up appointment, the skilled rehab facility will also need to assist the patient in arranging follow up appointment in our office and any transportation needs.  MAKE SURE YOU:   Understand these instructions.   Get help right away if you are not doing well or get worse.   Pick up stool softner and laxative for home use following surgery while on pain medications. Do not submerge incision under water. Please use good hand washing techniques while changing dressing each day. May shower starting three days after surgery. Please use a clean towel to pat the incision dry following showers. Continue to use ice for pain and swelling after surgery. Do not use any lotions or creams on the incision until instructed by your surgeon.

## 2015-05-26 NOTE — Progress Notes (Signed)
   Subjective: 2 Days Post-Op Procedure(s) (LRB): TOTAL HIP ARTHROPLASTY ANTERIOR APPROACH (Right)  UTI Patient reports pain as 0 on 0-10 scale.   Patient is well, and has had no acute complaints or problems Continue with physical  therapy today.  Plan is to go Rehab after hospital stay. no nausea and no vomiting Patient denies any chest pains or shortness of breath. Pt is not able mentally to use the IS Objective: Vital signs in last 24 hours: Temp:  [97.8 F (36.6 C)-102.4 F (39.1 C)] 100 F (37.8 C) (11/13 0558) Pulse Rate:  [87-103] 102 (11/13 0451) Resp:  [16-22] 18 (11/13 0451) BP: (137-150)/(58-73) 150/66 mmHg (11/13 0451) SpO2:  [92 %-97 %] 97 % (11/13 0451) well approximated incision Heels are non tender and elevated off the bed using rolled towels Intake/Output from previous day: 11/12 0701 - 11/13 0700 In: 1193.3 [I.V.:1193.3] Out: -  Intake/Output this shift:     Recent Labs  05/23/15 1845 05/24/15 0600 05/25/15 0956  HGB 15.1 13.1 11.8*    Recent Labs  05/24/15 0600 05/25/15 0956  WBC 8.4 9.8  RBC 3.96 3.63*  HCT 38.8 35.2  PLT 186 183    Recent Labs  05/24/15 0600 05/25/15 0315  NA 137 138  K 4.2 3.7  CL 105 109  CO2 23 22  BUN 16 10  CREATININE 0.93 0.65  GLUCOSE 111* 115*  CALCIUM 8.2* 7.6*    Recent Labs  05/23/15 1845  INR 1.03    EXAM General - Patient is Alert and Appropriate Extremity - Neurologically intact Neurovascular intact Sensation intact distally Intact pulses distally Dorsiflexion/Plantar flexion intact Dressing - scant drainage Motor Function - intact, moving foot and toes well on exam.    Past Medical History  Diagnosis Date  . Hyperlipidemia   . Depression   . Osteoarthritis   . Hypothyroidism   . Hypertension   . GERD (gastroesophageal reflux disease)   . Femur fracture, right (HCC)   . Tremor     benign essential tremor- tremors of voice, head and jaw  . Dementia     Assessment/Plan: 2  Days Post-Op Procedure(s) (LRB): TOTAL HIP ARTHROPLASTY ANTERIOR APPROACH (Right) Active Problems:   Hip fracture (HCC)  Estimated body mass index is 27.03 kg/(m^2) as calculated from the following:   Height as of this encounter: 5\' 1"  (1.549 m).   Weight as of this encounter: 64.864 kg (143 lb). Up with therapy Discharge to SNF when cleared by medicine   Labs: not back as of the visit DVT Prophylaxis - Lovenox, Foot Pumps and TED hose Weight-Bearing as tolerated to right leg Needs a bowel movement  [facility] 6 weeks with Dr. Danella DeisMenz Staples will need to be removed 2 weeks post op Continue lovenox 40mg  for 2 weeks at discharge   [facility] R. [facility] PA [facility] 05/26/2015, 7:21 AM

## 2015-05-27 ENCOUNTER — Encounter: Payer: Self-pay | Admitting: Orthopedic Surgery

## 2015-05-27 MED ORDER — CIPROFLOXACIN HCL 250 MG PO TABS: 250.0000 mg | ORAL_TABLET | Freq: Two times a day (BID) | ORAL | Status: DC

## 2015-05-27 MED ORDER — ENOXAPARIN SODIUM 40 MG/0.4ML ~~LOC~~ SOLN: 40.0000 mg | SUBCUTANEOUS | Status: DC

## 2015-05-27 MED ORDER — TRAMADOL HCL 50 MG PO TABS: 50.0000 mg | ORAL_TABLET | Freq: Four times a day (QID) | ORAL | Status: DC | PRN

## 2015-05-27 MED ORDER — OXYCODONE HCL 5 MG PO TABS: 5.0000 mg | ORAL_TABLET | ORAL | Status: DC | PRN

## 2015-05-27 MED ORDER — OXYCODONE HCL 5 MG PO TABS: 5.0000 mg | ORAL_TABLET | Freq: Once | ORAL | Status: DC | PRN

## 2015-05-27 NOTE — [place] FL2 (Signed)
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[place] )   Facility and Address:  [facility], [address], [place], [place] [address]      Provider Number: [id number]  Attending Physician Name and Address:  Katha HammingSnehalatha Konidena, MD  Relative Name and Phone Number:       Current Level of Care: Hospital Recommended Level of Care: Skilled Nursing Facility Prior Approval Number:    Date Approved/Denied:   PASRR Number:  [id number])  Discharge Plan: SNF    Current Diagnoses: Patient Active Problem List   Diagnosis Date Noted  . Hip fracture (HCC) 05/23/2015    Orientation ACTIVITIES/SOCIAL BLADDER RESPIRATION    Self  Active Incontinent Normal  BEHAVIORAL SYMPTOMS/MOOD NEUROLOGICAL BOWEL NUTRITION STATUS   (none)  (none) Continent Diet  PHYSICIAN VISITS COMMUNICATION OF NEEDS Height & Weight Skin  30 days Verbally   143 lbs. Normal          AMBULATORY STATUS RESPIRATION    Assist extensive Normal      Personal Care Assistance Level of Assistance  Bathing, Feeding, Dressing Bathing Assistance: Limited assistance Feeding assistance: Limited assistance Dressing Assistance: Limited assistance      Functional Limitations Info  Sight, Hearing Sight Info: Impaired Hearing Info: Impaired Speech Info: Adequate       SPECIAL CARE FACTORS FREQUENCY  PT (By licensed PT)                   Additional Factors Info  Code Status Code Status Info: Full Allergies Info: Codeine           Current Medications (05/27/2015): Current Facility-Administered Medications  Medication Dose Route Frequency Provider Last Rate Last Dose  . 0.9 %  sodium chloride infusion   Intravenous Continuous Kennedy BuckerMichael Menz, MD 20 mL/hr at 05/27/15 [phone]    . acetaminophen (TYLENOL) tablet  650 mg  650 mg Oral Q6H PRN Enid Baasadhika Kalisetti, MD   650 mg at 05/26/15 0454   Or  . acetaminophen (TYLENOL) suppository 650 mg  650 mg Rectal Q6H PRN Enid Baasadhika Kalisetti, MD      . alum & mag hydroxide-simeth (MAALOX/MYLANTA) 200-200-20 MG/5ML suspension 30 mL  30 mL Oral Q4H PRN Kennedy BuckerMichael Menz, MD      . atenolol (TENORMIN) tablet 50 mg  50 mg Oral Daily Enid Baasadhika Kalisetti, MD   50 mg at 05/27/15 0801  . bisacodyl (DULCOLAX) suppository 10 mg  10 mg Rectal Daily PRN Enid Baasadhika Kalisetti, MD   10 mg at 05/25/15 1751  . cefTRIAXone (ROCEPHIN) 1 g in dextrose 5 % 50 mL IVPB  1 g Intravenous Q24H Katha HammingSnehalatha Konidena, MD   1 g at 05/26/15 1611  . diphenhydrAMINE (BENADRYL) 12.5 MG/5ML elixir 12.5-25 mg  12.5-25 mg Oral Q4H PRN Kennedy BuckerMichael Menz, MD      . docusate sodium (COLACE) capsule 100 mg  100 mg Oral BID Enid Baasadhika Kalisetti, MD   100 mg at 05/27/15 0801  . enoxaparin (LOVENOX) injection 40 mg  40 mg Subcutaneous Q24H Kennedy BuckerMichael Menz, MD   40 mg at 05/27/15 0801  . fentaNYL (SUBLIMAZE) injection 25-50 mcg  25-50 mcg Intravenous Q5 min PRN Rosaria FerriesJoseph K Piscitello, MD      . levothyroxine (SYNTHROID, LEVOTHROID) tablet 100 mcg  100 mcg Oral QAC breakfast Enid Baasadhika Kalisetti, MD   100 mcg at 05/27/15 0801  .  lisinopril (PRINIVIL,ZESTRIL) tablet 10 mg  10 mg Oral Daily Enid Baas, MD   10 mg at 05/27/15 0801  . loratadine (CLARITIN) tablet 10 mg  10 mg Oral Daily Enid Baas, MD   10 mg at 05/27/15 0802  . magnesium citrate solution 1 Bottle  1 Bottle Oral Once PRN Kennedy Bucker, MD      . magnesium hydroxide (MILK OF MAGNESIA) suspension 30 mL  30 mL Oral Daily PRN Kennedy Bucker, MD      . menthol-cetylpyridinium (CEPACOL) lozenge 3 mg  1 lozenge Oral PRN Kennedy Bucker, MD       Or  . phenol (CHLORASEPTIC) mouth spray 1 spray  1 spray Mouth/Throat PRN Kennedy Bucker, MD      . morphine 2 MG/ML injection 2 mg  2 mg Intravenous Q1H PRN Kennedy Bucker, MD      . ondansetron [facility]) tablet 4 mg  4 mg Oral Q6H PRN  Enid Baas, MD       Or  . ondansetron (ZOFRAN) injection 4 mg  4 mg Intravenous Q6H PRN Enid Baas, MD      . oxyCODONE (Oxy IR/ROXICODONE) immediate release tablet 5 mg  5 mg Oral Once PRN Rosaria Ferries, MD       Or  . oxyCODONE (ROXICODONE) 5 MG/5ML solution 5 mg  5 mg Oral Once PRN Rosaria Ferries, MD      . oxyCODONE (Oxy IR/ROXICODONE) immediate release tablet 5-10 mg  5-10 mg Oral Q3H PRN Kennedy Bucker, MD      . polyethylene glycol (MIRALAX / GLYCOLAX) packet 17 g  17 g Oral Daily PRN Enid Baas, MD      . pravastatin (PRAVACHOL) tablet 20 mg  20 mg Oral QHS Enid Baas, MD   20 mg at 05/26/15 2139  . primidone (MYSOLINE) tablet 250 mg  250 mg Oral TID Enid Baas, MD   250 mg at 05/27/15 0802  . sodium chloride 0.9 % injection 3 mL  3 mL Intravenous Q12H Enid Baas, MD   3 mL at 05/23/15 2205  . traMADol (ULTRAM) tablet 50 mg  50 mg Oral Q6H PRN Enid Baas, MD   50 mg at 05/26/15 0946  . Vitamin D (Ergocalciferol) (DRISDOL) capsule 50,000 Units  50,000 Units Oral Q30 days Enid Baas, MD   0 Units at 05/23/15 2205   Do not use this list as official medication orders. Please verify with discharge summary.  Discharge Medications:   Medication List    STOP taking these medications        acetaminophen 650 MG CR tablet  Commonly known as:  TYLENOL      TAKE these medications        alendronate 70 MG tablet  Commonly known as:  FOSAMAX  Take 70 mg by mouth once a week. On Monday     atenolol 50 MG tablet  Commonly known as:  TENORMIN  Take 50 mg by mouth daily.     carbidopa-levodopa 10-100 MG tablet  Commonly known as:  SINEMET IR  Take 1 tablet by mouth 2 (two) times daily.     carbidopa-levodopa 10-100 MG tablet  Commonly known as:  SINEMET IR  Take 1 tablet by mouth every morning. To make a total morning dose of 2 tablets for 23 days     ciprofloxacin 250 MG tablet  Commonly known as:  CIPRO  Take 1  tablet (250 mg total) by mouth 2 (two) times daily.  enoxaparin 40 MG/0.4ML injection  Commonly known as:  LOVENOX  Inject 0.4 mLs (40 mg total) into the skin daily.     ketotifen 0.025 % ophthalmic solution  Commonly known as:  ZADITOR  Place 1 drop into both eyes daily.     levothyroxine 100 MCG tablet  Commonly known as:  SYNTHROID, LEVOTHROID  Take 100 mcg by mouth daily.     lisinopril 10 MG tablet  Commonly known as:  PRINIVIL,ZESTRIL  Take 10 mg by mouth daily.     liver oil-zinc oxide 40 % ointment  Commonly known as:  DESITIN  Apply 1 application topically as needed for irritation (after toileting).     loperamide 2 MG tablet  Commonly known as:  IMODIUM A-D  Take 2 mg by mouth 4 (four) times daily as needed for diarrhea or loose stools.     loratadine 10 MG tablet  Commonly known as:  CLARITIN  Take 10 mg by mouth daily.     oxyCODONE 5 MG immediate release tablet  Commonly known as:  Oxy IR/ROXICODONE  Take 1 tablet (5 mg total) by mouth once as needed (for pain score of 1-4).     pravastatin 20 MG tablet  Commonly known as:  PRAVACHOL  Take 20 mg by mouth at bedtime.     primidone 250 MG tablet  Commonly known as:  MYSOLINE  Take 250 mg by mouth 3 (three) times daily. At 8am, 1pm, and bedtime     traMADol 50 MG tablet  Commonly known as:  ULTRAM  Take 1 tablet (50 mg total) by mouth every 6 (six) hours as needed for moderate pain or severe pain.     trospium 20 MG tablet  Commonly known as:  SANCTURA  Take 20 mg by mouth daily.     Vitamin D (Ergocalciferol) 50000 UNITS Caps capsule  Commonly known as:  DRISDOL  Take 50,000 Units by mouth every 30 (thirty) days. Once a month on the first Monday        Relevant Imaging Results:  Relevant Lab Results:  Recent Labs    Additional Information SS: [id number]  Haig Prophet, LCSW

## 2015-05-27 NOTE — Progress Notes (Signed)
   Subjective: 3 Days Post-Op Procedure(s) (LRB): TOTAL HIP ARTHROPLASTY ANTERIOR APPROACH (Right)  UTI Patient reports pain as 0 on 0-10 scale.   Patient is well, and has had no acute complaints or problems Continue with physical  therapy today.  Plan is to go Rehab after hospital stay. Possible discharge to rehabilitation today. no nausea and no vomiting Patient denies any chest pains or shortness of breath. Pt is not able mentally to use the IS Objective: Vital signs in last 24 hours: Temp:  [97.8 F (36.6 C)-100.6 F (38.1 C)] 100.6 F (38.1 C) (11/13 2020) Pulse Rate:  [90-102] 102 (11/13 2020) Resp:  [18] 18 (11/13 2020) BP: (132-149)/(54-68) 149/60 mmHg (11/13 2020) SpO2:  [93 %-98 %] 98 % (11/13 2020) well approximated incision Heels are non tender and elevated off the bed using rolled towels Intake/Output from previous day: 11/13 0701 - 11/14 0700 In: 1115 [P.O.:120; I.V.:995] Out: -  Intake/Output this shift:     Recent Labs  05/25/15 0956  HGB 11.8*    Recent Labs  05/25/15 0956  WBC 9.8  RBC 3.63*  HCT 35.2  PLT 183    Recent Labs  05/25/15 0315  NA 138  K 3.7  CL 109  CO2 22  BUN 10  CREATININE 0.65  GLUCOSE 115*  CALCIUM 7.6*   No results for input(s): LABPT, INR in the last 72 hours.  EXAM General - Patient is Alert and Appropriate Extremity - Neurologically intact Neurovascular intact Sensation intact distally Intact pulses distally Dorsiflexion/Plantar flexion intact Dressing - scant drainage Motor Function - intact, moving foot and toes well on exam.    Past Medical History  Diagnosis Date  . Hyperlipidemia   . Depression   . Osteoarthritis   . Hypothyroidism   . Hypertension   . GERD (gastroesophageal reflux disease)   . Femur fracture, right (HCC)   . Tremor     benign essential tremor- tremors of voice, head and jaw  . Dementia     Assessment/Plan: 3 Days Post-Op Procedure(s) (LRB): TOTAL HIP ARTHROPLASTY  ANTERIOR APPROACH (Right) Active Problems:   Hip fracture (HCC)  Estimated body mass index is 27.03 kg/(m^2) as calculated from the following:   Height as of this encounter: 5\' 1"  (1.549 m).   Weight as of this encounter: 64.864 kg (143 lb). Up with therapy Discharge to SNF when cleared by medicine   Labs: not back as of the visit DVT Prophylaxis - Lovenox, Foot Pumps and TED hose Weight-Bearing as tolerated to right leg Needs a bowel movement  [facility] with Dr. Rosita KeaMenz for staple removal. Continue lovenox 40mg  for 2 weeks at discharge   [facility] Bryn Saline PA-C [facility] 05/27/2015, 6:21 AM

## 2015-05-27 NOTE — Progress Notes (Signed)
[facility] because [facility] no [facility]. Patient is medically stable for D/C to [facility] today. Per [facility] they have approved transfer [facility]. [facility] is [facility] to private [facility]. RN called report and [facility] made arrangements for [facility] to transport[facility] that [facility] could not [facility]. CSW sent D/C Summary and FL2 to [facility] at [facility]. Patient is aware of above. CSW contacted patient's daughter Boyd Kerbsenny and made her aware of above. Please reconsult if future social work needs arise. CSW signing off.   Jetta LoutBailey Morgan, LCSWA [phone]

## 2015-05-27 NOTE — Anesthesia Postprocedure Evaluation (Signed)
  Anesthesia Post-op Note  Patient: Brandy DressSarah Oconnor  Procedure(s) Performed: Procedure(s): TOTAL HIP ARTHROPLASTY ANTERIOR APPROACH (Right)  Anesthesia type:General, MAC, Spinal  Patient location: Floor  Post pain: Pain level controlled  Post assessment: Post-op Vital signs reviewed, Patient's Cardiovascular Status Stable, Respiratory Function Stable, Patent Airway and No signs of Nausea or vomiting  Post vital signs: Reviewed and stable  Last Vitals:  Filed Vitals:   05/27/15 1403  BP: 152/71  Pulse: 97  Temp: 37.3 C  Resp: 18    Level of consciousness: awake, alert  and patient cooperative  Complications: No apparent anesthesia complications

## 2015-05-27 NOTE — Progress Notes (Signed)
Physical Therapy Treatment Patient Details Name: Brandy DressSarah Oconnor MRN: [id number] DOB: [birth year] Today's Date: 05/27/2015    History of Present Illness Pt is an 81 yo female with essential tremors of the face and head who was admitted on 05/23/2015 with R femoral neck fracture and is s/p R anterior hip replacement.  Pt is a poor historian, but per chart pt lives alone, uses a RW at baseline, and just entered [facility] program.    PT Comments    Pt with improved awareness today, pt able to follow all commands, perform bed mobility with modAx1 supine to sit.  Pt with noted urinary incontinence able to sit at EOB x 5 min and perform sit to stand with modA x 1.  Standing tolerance x 2 min while cleaning pt.  Pt able to repeat sit to stand with modA x 1 with cues for increased posture, posterior lean against bed noted.    Follow Up Recommendations  SNF     Equipment Recommendations       Recommendations for Other Services       Precautions / Restrictions Precautions Precautions: Anterior Hip Restrictions Weight Bearing Restrictions: Yes RLE Weight Bearing: Weight bearing as tolerated    Mobility  Bed Mobility Overal bed mobility: Needs Assistance Bed Mobility: Supine to Sit;Sit to Supine     Supine to sit: Mod assist Sit to supine: Max assist   General bed mobility comments: Cues required to scoot to EOB, pt lwith significant lean to R able to correct with cues  Transfers Overall transfer level: Needs assistance Equipment used: Rolling walker (2 wheeled) Transfers: Sit to/from Stand Sit to Stand: Mod assist;Max assist         General transfer comment: Pt with significant posterior lean erquiring max vc/tc's to tuck butt in and improve forward flexion.  Pt performed sit<>stand x 2 with standing tolerance x 2 min to perform cleaning d/t episode of urinary incontinence  Ambulation/Gait                 Stairs            Wheelchair Mobility    Modified Rankin  (Stroke Patients Only)       Balance Overall balance assessment: Needs assistance Sitting-balance support: Bilateral upper extremity supported Sitting balance-Leahy Scale: Fair Sitting balance - Comments: Pt with R lean sitting unsupported, able to maintain neutral with cues Postural control: Right lateral lean Standing balance support: Bilateral upper extremity supported Standing balance-Leahy Scale: Poor                      Cognition Arousal/Alertness: Awake/alert Behavior During Therapy: WFL for tasks assessed/performed Overall Cognitive Status: History of cognitive impairments - at baseline Area of Impairment: Safety/judgement;Awareness   Current Attention Level: Focused Memory: Decreased recall of precautions;Decreased short-term memory Following Commands: Follows one step commands consistently;Follows one step commands with increased time            Exercises      General Comments        Pertinent Vitals/Pain Pain Assessment: 0-10 Pain Score: 3     Home Living                      Prior Function            PT Goals (current goals can now be found in the care plan section) Acute Rehab PT Goals Patient Stated Goal: none stated PT Goal Formulation: Patient unable to participate  in goal setting Progress towards PT goals: Progressing toward goals    Frequency  BID    PT Plan Current plan remains appropriate    Co-evaluation             End of Session Equipment Utilized During Treatment: Gait belt Activity Tolerance: Patient tolerated treatment well Patient left: in bed;with call bell/phone within reach;with bed alarm set;with family/visitor present     Time: 1035-1105 PT Time Calculation (min) (ACUTE ONLY): 30 min  Charges:  $Therapeutic Activity: 23-37 mins                    G Codes:      Railynn Ballo June 03, 2015, 2:30 PM  Saretta Dahlem, PTA

## 2015-05-27 NOTE — Clinical Social Work Placement (Signed)
   CLINICAL SOCIAL WORK PLACEMENT  NOTE  Date:  05/27/2015  Patient Details  Name: Brandy DressSarah Rorke MRN: [id number] Date of Birth: 01/09/1931  Clinical Social Work is seeking post-discharge placement for this patient at the Skilled  Nursing Facility level of care (*CSW will initial, date and re-position this form in  chart as items are completed):  Yes   Patient/family provided with [facility] Clinical Social Work Department's list of facilities offering this level of care within the geographic area requested by the patient (or if unable, by the patient's family).  Yes   Patient/family informed of their freedom to choose among providers that offer the needed level of care, that participate in Medicare, Medicaid or managed care program needed by the patient, have an available bed and are willing to accept the patient.  Yes   Patient/family informed of [facility]'s ownership interest in [facility] and [facility], as well as of the fact that they are under no obligation to receive care at these facilities.  PASRR submitted to EDS on       PASRR number received on       Existing PASRR number confirmed on 05/27/15     FL2 transmitted to all facilities in geographic area requested by pt/family on 05/27/15     FL2 transmitted to all facilities within larger geographic area on       Patient informed that his/her managed care company has contracts with or will negotiate with certain facilities, including the following:        Yes   Patient/family informed of bed offers received.  Patient chooses bed at  [facility] )     Physician recommends and patient chooses bed at      Patient to be transferred to  [facility] ) on 05/27/15.  Patient to be transferred to facility by  Sharin Mons([facility])     Patient family notified on 05/27/15 of transfer.  Name of family member notified:   (Patient's daughter Boyd Kerbsenny is aware of D/C today. )     PHYSICIAN       Additional  Comment:    _______________________________________________ Haig ProphetMorgan, Sagar Tengan G, LCSW 05/27/2015, 1:56 PM

## 2015-05-27 NOTE — Discharge Summary (Signed)
If you experience worsening of your admission symptoms, develop shortness of breath, life threatening emergency, suicidal or homicidal thoughts you must seek medical attention immediately by calling 911 or calling your MD immediately  if symptoms less severe.  You Must read complete instructions/literature along with all the possible adverse reactions/side effects for all the Medicines you take and that have been prescribed to you. Take any new Medicines after you have completely understood and accpet all the possible adverse reactions/side effects.   Please note  You were cared for by a hospitalist during your hospital stay. If you have any questions about your discharge medications or the care you received while you were in the hospital after you are discharged, you can call the unit and asked to speak with the hospitalist on call if the hospitalist that took care of you is not available. Once you are discharged, your primary care physician will handle any further medical issues. Please note that NO REFILLS for any discharge medications will be authorized once you are discharged, as it is imperative that you return to your primary care physician (or establish a relationship with a primary care physician if you do not have one) for y                                                                                                                                                                      Brandy DressSarah Oconnor, is a 81 y.o. female  DOB 06-20-1931  MRN [id number].  Admission date:  05/23/2015  Admitting Physician  Enid Baasadhika Kalisetti, MD  Discharge Date:  05/27/2015   Primary MD  No primary care provider on file.  Recommendations for primary care physician for things to follow:     Admission Diagnosis  Hip fracture, right, closed, initial encounter [facility](HCC) [S72.001A]   Discharge Diagnosis  Hip fracture, right, closed, initial encounter (HCC) [S72.001A]   Active Problems:   Hip fracture [facility](HCC)       Past Medical History  Diagnosis Date  . Hyperlipidemia   . Depression   . Osteoarthritis   . Hypothyroidism   . Hypertension   . GERD (gastroesophageal reflux disease)   . Femur fracture, right (HCC)   . Tremor     benign essential tremor- tremors of voice, head and jaw  . Dementia     Past Surgical History  Procedure Laterality Date  . Abdominal hysterectomy    . Cholecystectomy    . Appendectomy    . Thyroidectomy, partial         History of present illness and  Hospital Course:     Kindly see H&P for history of present illness and admission details, please review complete Labs, Consult reports and Test reports for all details in brief  HPI  from the history and physical done on the day of admission  81 year old female patient with history of dementia, benign essential tremors admitted because of the fall and also right hip pain. Patient found to have displaced right femoral neck fracture. Admitted for same.  Hospital Course   #1. Right femoral neck fracture: Patient is seen by orthopedic, and was taken to the OR on the 11th and had a total right hip arthroplasty. Patient tolerated the procedure well. Did not require any blood transfusion after the surgery. Patient the pain is well controlled. Patient is seen by physical therapy, they recommended SNF placement, patient medically stable for placement. Continue Lovenox for 2 weeks. Follow-up with Dr. Sherrell Puller in 2 weeks for staple removal. #2 .patient had fever and the found to have UTI. Started on IV Rocephin. Discharged with Cipro for 7 days.  #3 dementia; Parkinson dementia: Patient is on carbidopa and levodopa continue them. #4; benign  essential tremors; continue prenatal  #5 hypothyroidism; continue Synthyroid #6 hypertension. Continue lisinopril; . Discharge Condition:    Follow UP      Follow-up Information    Follow up with MENZ,MICHAEL, MD In 2 weeks.   Specialty:  Orthopedic Surgery   Why:  For  staple removal, For wound re-check   Contact information:   [address] [facility] [place] [address] [phone]         Discharge Instructions  and  Discharge Medications        Medication List    STOP taking these medications        acetaminophen 650 MG CR tablet  Commonly known as:  TYLENOL      TAKE these medications        alendronate 70 MG tablet  Commonly known as:  FOSAMAX  Take 70 mg by mouth once a week. On Monday     atenolol 50 MG tablet  Commonly known as:  TENORMIN  Take 50 mg by mouth daily.     carbidopa-levodopa 10-100 MG tablet  Commonly known as:  SINEMET IR  Take 1 tablet by mouth 2 (two) times daily.     carbidopa-levodopa 10-100 MG tablet  Commonly known as:  SINEMET IR  Take 1 tablet by mouth every morning. To make a total morning dose of 2 tablets for 23 days     ciprofloxacin 250 MG tablet  Commonly known as:  CIPRO  Take 1 tablet (250 mg total) by mouth 2 (two) times daily.     enoxaparin 40 MG/0.4ML injection  Commonly known as:  LOVENOX  Inject 0.4 mLs (40 mg total) into the skin daily.     ketotifen 0.025 % ophthalmic solution  Commonly known as:  ZADITOR  Place 1 drop into both eyes daily.     levothyroxine 100 MCG tablet  Commonly known as:  SYNTHROID, LEVOTHROID  Take 100 mcg by mouth daily.     lisinopril 10 MG tablet  Commonly known as:  PRINIVIL,ZESTRIL  Take 10 mg by mouth daily.     liver oil-zinc oxide 40 % ointment  Commonly known as:  DESITIN  Apply 1 application topically as needed for irritation (after toileting).     loperamide 2 MG tablet  Commonly known as:  IMODIUM A-D  Take 2 mg by mouth 4 (four) times daily as needed for diarrhea or loose stools.     loratadine 10 MG tablet  Commonly known as:  CLARITIN  Take 10 mg by mouth  daily.     oxyCODONE 5 MG immediate release tablet  Commonly known as:  Oxy IR/ROXICODONE  Take 1 tablet (5 mg total) by mouth once as needed  (for pain score of 1-4).     pravastatin 20 MG tablet  Commonly known as:  PRAVACHOL  Take 20 mg by mouth at bedtime.     primidone 250 MG tablet  Commonly known as:  MYSOLINE  Take 250 mg by mouth 3 (three) times daily. At 8am, 1pm, and bedtime     traMADol 50 MG tablet  Commonly known as:  ULTRAM  Take 1 tablet (50 mg total) by mouth every 6 (six) hours as needed for moderate pain or severe pain.     trospium 20 MG tablet  Commonly known as:  SANCTURA  Take 20 mg by mouth daily.     Vitamin D (Ergocalciferol) 50000 UNITS Caps capsule  Commonly known as:  DRISDOL  Take 50,000 Units by mouth every 30 (thirty) days. Once a month on the first Monday          Diet and Activity recommendation: See Discharge Instructions above   Consults obtained -orthopedic   Major procedures and Radiology Reports - PLEASE review detailed and final reports for all details, in brief -      Dg Chest 1 View  05/25/2015  CLINICAL DATA:  Postoperative yesterday total hip revision, now with fever. EXAM: CHEST 1 VIEW COMPARISON:  Chest x-ray dated 05/23/2015. FINDINGS: Heart size is normal. Overall cardiomediastinal silhouette is stable. Study is slightly hypoinspiratory with associated crowding of the perihilar bronchovascular markings. Given the hypoinspiratory changes, lungs are clear. No evidence of developing pneumonia. No pleural effusion. No pneumothorax. Incidental note again made of a slightly elevated right hemidiaphragm. No acute osseous abnormality. IMPRESSION: Hypoinspiratory exam. No evidence of acute cardiopulmonary abnormality seen. Electronically Signed   By: Bary Richard M.D.   On: 05/25/2015 11:35   Dg Chest 1 View  05/23/2015  CLINICAL DATA:  Preoperative for right femoral neck fracture ORIF EXAM: CHEST 1 VIEW COMPARISON:  10/21/2012 chest radiograph. FINDINGS: Stable cardiomediastinal silhouette with normal heart size. No pneumothorax. No pleural effusion. Clear lungs, with no  focal lung consolidation and no pulmonary edema. Cholecystectomy clip in the right upper quadrant of the abdomen. IMPRESSION: No active disease. Electronically Signed   By: Delbert Phenix M.D.   On: 05/23/2015 19:00   Dg Hip Operative Unilat With Pelvis Right  05/24/2015  CLINICAL DATA:  Right hip arthroplasty. EXAM: OPERATIVE RIGHT HIP (WITH PELVIS IF PERFORMED) 1 VIEWS TECHNIQUE: Fluoroscopic spot image(s) were submitted for interpretation post-operatively. COMPARISON:  None. FINDINGS: Single view of the right hip arthroplasty shows the components to be well seated and aligned. The inferior aspect of the femoral component is not included on the field of view. No evidence of an acute fracture or operative complication. IMPRESSION: Operative image from right hip arthroplasty. Electronically Signed   By: Amie Portland M.D.   On: 05/24/2015 16:04   Dg Hip Unilat W Or W/o Pelvis 2-3 Views Right  05/24/2015  CLINICAL DATA:  New right hip replacement. EXAM: DG HIP (WITH OR WITHOUT PELVIS) 2-3V RIGHT COMPARISON:  05/23/2015. FINDINGS: Total right hip replacement. Hardware intact. No acute bony abnormality. IMPRESSION: Total right hip replacement with good anatomic alignment. Electronically Signed   By: Maisie Fus  Register   On: 05/24/2015 16:52   Dg Hip Unilat With Pelvis 2-3 Views Right  05/23/2015  CLINICAL DATA:  Right hip pain after fall  EXAM: DG HIP (WITH OR WITHOUT PELVIS) 2-3V RIGHT COMPARISON:  None. FINDINGS: There is a comminuted mid right femoral neck fracture with mild apex lateral angulation, 1 cm superior displacement of the dominant distal fracture fragment and mild impaction. No dislocation at the right hip joint. No additional fracture. No suspicious focal osseous lesion. Diffuse osteopenia. IMPRESSION: Comminuted right mid femoral neck fracture as described. Electronically Signed   By: Delbert Phenix M.D.   On: 05/23/2015 18:58    Micro Results     Recent Results (from the past 240 hour(s))   MRSA PCR Screening     Status: None   Collection Time: 05/23/15 10:08 PM  Result Value Ref Range Status   MRSA by PCR NEGATIVE NEGATIVE Final    Comment:        The GeneXpert MRSA Assay (FDA approved for NASAL specimens only), is one component of a comprehensive MRSA colonization surveillance program. It is not intended to diagnose MRSA infection nor to guide or monitor treatment for MRSA infections.        Today   Subjective:   Brandy Oconnor today has no headache,no chest abdominal pain,no new weakness tingling or numbnes .sstable  for transfer to skilled nursing.  Objective:   Blood pressure 162/76, pulse 98, temperature 98.9 F (37.2 C), temperature source Oral, resp. rate 18, height 5\' 1"  (1.549 m), weight 64.864 kg (143 lb), SpO2 95 %.   Intake/Output Summary (Last 24 hours) at 05/27/15 0952 Last data filed at 05/27/15 0838  Gross per 24 hour  Intake 2956.67 ml  Output      0 ml  Net 2956.67 ml    Exam Awake Alert, Oriented x 3, No new F.N deficits, Normal affect [place].AT,PERRAL Supple Neck,No JVD, No cervical lymphadenopathy appriciated.  Symmetrical Chest wall movement, Good air movement bilaterally, CTAB RRR,No Gallops,Rubs or new Murmurs, No Parasternal Heave +ve B.Sounds, Abd Soft, Non tender, No organomegaly appriciated, No rebound -guarding or rigidity. No Cyanosis, Clubbing or edema, No new Rash or bruise  Data Review   CBC w Diff:  Lab Results  Component Value Date   WBC 9.8 05/25/2015   WBC 7.7 11/09/2014   HGB 11.8* 05/25/2015   HGB 10.8* 11/09/2014   HCT 35.2 05/25/2015   HCT 32.5* 11/09/2014   PLT 183 05/25/2015   PLT 138* 11/09/2014   LYMPHOPCT 20 05/23/2015   LYMPHOPCT 20.9 11/09/2014   MONOPCT 7 05/23/2015   MONOPCT 5.9 11/09/2014   EOSPCT 2 05/23/2015   EOSPCT 7.8 11/09/2014   BASOPCT 0 05/23/2015   BASOPCT 0.3 11/09/2014    CMP:  Lab Results  Component Value Date   NA 138 05/25/2015   NA 144 11/09/2014   K 3.7 05/25/2015    K 3.5 11/09/2014   CL 109 05/25/2015   CL 116* 11/09/2014   CO2 22 05/25/2015   CO2 24 11/09/2014   BUN 10 05/25/2015   BUN 10 11/09/2014   CREATININE 0.65 05/25/2015   CREATININE 0.71 11/09/2014   PROT 7.6 05/23/2015   PROT 7.0 10/21/2012   ALBUMIN 3.9 05/23/2015   ALBUMIN 3.8 10/21/2012   BILITOT 0.2* 05/23/2015   BILITOT 0.3 10/21/2012   ALKPHOS 80 05/23/2015   ALKPHOS 104 10/21/2012   AST 19 05/23/2015   AST 54* 10/21/2012   ALT 19 05/23/2015   ALT 36 10/21/2012  .   Total Time in preparing paper work, data evaluation and todays exam - 35 minutes  Nieshia Larmon M.D on 05/27/2015 at 9:52 AM  Note: This dictation was prepared with Dragon dictation along with smaller phrase technology. Any transcriptional errors that result from this process are unintentional. our aftercare needs so that they can reassess your need for medications and monitor your lab values.

## 2015-05-27 NOTE — Progress Notes (Signed)
Rested quietly during the night. No s/s of pain.

## 2015-05-27 NOTE — Progress Notes (Signed)
Report called to Candy at [facility]. Transportation is in route. IV removed. Waiting on transportation.

## 2015-05-29 LAB — SURGICAL PATHOLOGY

## 2015-11-15 ENCOUNTER — Encounter: Payer: Self-pay | Admitting: *Deleted

## 2015-11-15 ENCOUNTER — Emergency Department: Payer: Medicare (Managed Care)

## 2015-11-15 ENCOUNTER — Emergency Department
Admission: EM | Admit: 2015-11-15 | Discharge: 2015-11-15 | Disposition: A | Discharge status: Home / Self Care | Payer: Medicare (Managed Care) | Attending: Emergency Medicine | Admitting: Emergency Medicine

## 2015-11-15 DIAGNOSIS — K5900 Constipation, unspecified: Secondary | ICD-10-CM | POA: Insufficient documentation

## 2015-11-15 DIAGNOSIS — M1990 Unspecified osteoarthritis, unspecified site: Secondary | ICD-10-CM | POA: Insufficient documentation

## 2015-11-15 DIAGNOSIS — I10 Essential (primary) hypertension: Secondary | ICD-10-CM | POA: Diagnosis not present

## 2015-11-15 DIAGNOSIS — F329 Major depressive disorder, single episode, unspecified: Secondary | ICD-10-CM | POA: Diagnosis not present

## 2015-11-15 DIAGNOSIS — E785 Hyperlipidemia, unspecified: Secondary | ICD-10-CM | POA: Insufficient documentation

## 2015-11-15 DIAGNOSIS — R55 Syncope and collapse: Secondary | ICD-10-CM | POA: Diagnosis not present

## 2015-11-15 DIAGNOSIS — E039 Hypothyroidism, unspecified: Secondary | ICD-10-CM | POA: Insufficient documentation

## 2015-11-15 LAB — COMPREHENSIVE METABOLIC PANEL
ALT: 7 U/L — AB (ref 14–54)
AST: 19 U/L (ref 15–41)
Albumin: 3.8 g/dL (ref 3.5–5.0)
Alkaline Phosphatase: 90 U/L (ref 38–126)
Anion gap: 7 (ref 5–15)
BILIRUBIN TOTAL: 0.5 mg/dL (ref 0.3–1.2)
BUN: 16 mg/dL (ref 6–20)
CO2: 27 mmol/L (ref 22–32)
CREATININE: 0.95 mg/dL (ref 0.44–1.00)
Calcium: 9 mg/dL (ref 8.9–10.3)
Chloride: 107 mmol/L (ref 101–111)
GFR, EST NON AFRICAN AMERICAN: 54 mL/min — AB (ref >60)
Glucose, Bld: 110 mg/dL — ABNORMAL HIGH (ref 65–99)
POTASSIUM: 4.2 mmol/L (ref 3.5–5.1)
Sodium: 141 mmol/L (ref 135–145)
TOTAL PROTEIN: 6.4 g/dL — AB (ref 6.5–8.1)

## 2015-11-15 LAB — CBC
HEMATOCRIT: 41.9 % (ref 35.0–47.0)
Hemoglobin: 14.2 g/dL (ref 12.0–16.0)
MCH: 32.4 pg (ref 26.0–34.0)
MCHC: 34 g/dL (ref 32.0–36.0)
MCV: 95.4 fL (ref 80.0–100.0)
PLATELETS: 232 10*3/uL (ref 150–440)
RBC: 4.39 MIL/uL (ref 3.80–5.20)
RDW: 16.4 % — AB (ref 11.5–14.5)
WBC: 13.6 10*3/uL — AB (ref 3.6–11.0)

## 2015-11-15 LAB — URINALYSIS COMPLETE WITH MICROSCOPIC (ARMC ONLY)
BILIRUBIN URINE: NEGATIVE
Glucose, UA: NEGATIVE mg/dL
HGB URINE DIPSTICK: NEGATIVE
Leukocytes, UA: NEGATIVE
Nitrite: NEGATIVE
PH: 6 (ref 5.0–8.0)
Protein, ur: 100 mg/dL — AB
SPECIFIC GRAVITY, URINE: 1.017 (ref 1.005–1.030)

## 2015-11-15 LAB — LIPASE, BLOOD: LIPASE: 18 U/L (ref 11–51)

## 2015-11-15 LAB — TROPONIN I

## 2015-11-15 MED ORDER — SODIUM CHLORIDE 0.9 % IV SOLN
1000.0000 mL | Freq: Once | INTRAVENOUS | Status: AC
  Administered 2015-11-15: 1000 mL via INTRAVENOUS

## 2015-11-15 MED ORDER — ONDANSETRON HCL 4 MG/2ML IJ SOLN
4.0000 mg | Freq: Once | INTRAMUSCULAR | Status: AC
  Administered 2015-11-15: 4 mg via INTRAVENOUS
  Filled 2015-11-15: qty 2

## 2015-11-15 NOTE — ED Notes (Signed)
Per EMS report, patient [facility], but has a caregiver that comes in during the day. Caregiver called the EMS when the patient had an unresponsive episode while on the toilet. Patient had c/o nausea and diarrhea prior. Caregiver states the patient was unresponsive only for a few moments and did not fall or hit her head, but had a blood pressure of 95/50. Fire department on scene first reported a blood pressure of 105/60, EMS reported an initial blood pressure of 128/58. Blood sugar was 95. EMS reports patient returned to baseline and was alert and oriented x4, but c/o abdominal pain.

## 2015-11-15 NOTE — ED Provider Notes (Signed)
[facility] Emergency Department Provider Note  ____________________________________________    I have reviewed the triage vital signs and the nursing notes.   HISTORY  Chief Complaint Altered Mental Status  Patient is a relatively poor historian with a history of dementia  HPI Brandy Oconnor is a 82 y.o. female who presents after a near syncopal episode. [facility] but has a caregiver with her during the day. Caregiver called EMS because the patient briefly became unresponsive while on the toilet. Apparently the patient had complained of nausea and diarrhea prior to this episode. Initial blood pressure was 95/50 this is improved rapidly without intervention. Patient complains of mild abdominal pain currently but apparently she is back to her baseline.     Past Medical History  Diagnosis Date  . Hyperlipidemia   . Depression   . Osteoarthritis   . Hypothyroidism   . Hypertension   . GERD (gastroesophageal reflux disease)   . Femur fracture, right (HCC)   . Tremor     benign essential tremor- tremors of voice, head and jaw  . Dementia     Patient Active Problem List   Diagnosis Date Noted  . Hip fracture (HCC) 05/23/2015    Past Surgical History  Procedure Laterality Date  . Abdominal hysterectomy    . Cholecystectomy    . Appendectomy    . Thyroidectomy, partial    . Total hip arthroplasty Right 05/24/2015    Procedure: TOTAL HIP ARTHROPLASTY ANTERIOR APPROACH;  Surgeon: Kennedy BuckerMichael Menz, MD;  Location: [facility] ORS;  Service: Orthopedics;  Laterality: Right;    Current Outpatient Rx  Name  Route  Sig  Dispense  Refill  . alendronate (FOSAMAX) 70 MG tablet   Oral   Take 70 mg by mouth once a week. On Monday         . atenolol (TENORMIN) 50 MG tablet   Oral   Take 50 mg by mouth daily.         . carbidopa-levodopa (SINEMET IR) 10-100 MG tablet   Oral   Take 1 tablet by mouth 2 (two) times daily.         Marland Kitchen.  EXPIRED: carbidopa-levodopa (SINEMET IR) 10-100 MG tablet   Oral   Take 1 tablet by mouth every morning. To make a total morning dose of 2 tablets for 23 days         . ciprofloxacin (CIPRO) 250 MG tablet   Oral   Take 1 tablet (250 mg total) by mouth 2 (two) times daily.   14 tablet   0   . enoxaparin (LOVENOX) 40 MG/0.4ML injection   Subcutaneous   Inject 0.4 mLs (40 mg total) into the skin daily.   14 Syringe   0   . ketotifen (ZADITOR) 0.025 % ophthalmic solution   Both Eyes   Place 1 drop into both eyes daily.         Marland Kitchen. levothyroxine (SYNTHROID, LEVOTHROID) 100 MCG tablet   Oral   Take 100 mcg by mouth daily.         Marland Kitchen. lisinopril (PRINIVIL,ZESTRIL) 10 MG tablet   Oral   Take 10 mg by mouth daily.         Marland Kitchen. liver oil-zinc oxide (DESITIN) 40 % ointment   Topical   Apply 1 application topically as needed for irritation (after toileting).         Marland Kitchen. loperamide (IMODIUM A-D) 2 MG tablet   Oral   Take 2  mg by mouth 4 (four) times daily as needed for diarrhea or loose stools.         Marland Kitchen loratadine (CLARITIN) 10 MG tablet   Oral   Take 10 mg by mouth daily.         Marland Kitchen oxyCODONE (OXY IR/ROXICODONE) 5 MG immediate release tablet   Oral   Take 1 tablet (5 mg total) by mouth once as needed (for pain score of 1-4).   30 tablet   0   . pravastatin (PRAVACHOL) 20 MG tablet   Oral   Take 20 mg by mouth at bedtime.         . primidone (MYSOLINE) 250 MG tablet   Oral   Take 250 mg by mouth 3 (three) times daily. At 8am, 1pm, and bedtime         . traMADol (ULTRAM) 50 MG tablet   Oral   Take 1 tablet (50 mg total) by mouth every 6 (six) hours as needed for moderate pain or severe pain.   30 tablet   1   . trospium (SANCTURA) 20 MG tablet   Oral   Take 20 mg by mouth daily.         . Vitamin D, Ergocalciferol, (DRISDOL) 50000 UNITS CAPS capsule   Oral   Take 50,000 Units by mouth every 30 (thirty) days. Once a month on the first Monday            Allergies Codeine  Family History  Problem Relation Age of Onset  . Diabetes Mellitus II Mother   . Other Father     Tremors    Social History Social History  Substance Use Topics  . Smoking status: Never Smoker   . Smokeless tobacco: None  . Alcohol Use: No    Level V caveat: Review of Systems Limited by patient's dementia   Cardiovascular: Negative for chest pain Respiratory: Negative for shortness of breath. Gastrointestinal: Positive for abdominal pain     ____________________________________________   PHYSICAL EXAM:  VITAL SIGNS: ED Triage Vitals  Enc Vitals Group     BP 11/15/15 1144 143/67 mmHg     Pulse Rate 11/15/15 1144 63     Resp 11/15/15 1144 16     Temp 11/15/15 1144 97.6 F (36.4 C)     Temp Source 11/15/15 1144 Oral     SpO2 11/15/15 1144 98 %     Weight 11/15/15 1144 118 lb 1.6 oz (53.57 kg)     Height --      Head Cir --      Peak Flow --      Pain Score --      Pain Loc --      Pain Edu? --      Excl. in GC? --      Constitutional: Alert. No acute distress Eyes: Conjunctivae are normal.  ENT   Head: Normocephalic and atraumatic.   Mouth/Throat: Mucous membranes are moist. Cardiovascular: Normal rate, regular rhythm. Normal and symmetric distal pulses are present in the upper extremities.   Respiratory: Normal respiratory effort without tachypnea nor retractions. Breath sounds are clear and equal bilaterally.  Gastrointestinal: Mild tenderness to palpation in the left lower quadrant. No distention. There is no CVA tenderness. Genitourinary: deferred Musculoskeletal: Nontender with normal range of motion in all extremities. No lower extremity tenderness nor edema. Neurologic:  Normal speech and language. No gross focal neurologic deficits are appreciated. Skin:  Skin is warm, dry and intact. No rash  noted. Psychiatric: Patient is calm  ____________________________________________    LABS (pertinent  positives/negatives)  Labs Reviewed  CBC  COMPREHENSIVE METABOLIC PANEL  LIPASE, BLOOD  TROPONIN I  URINALYSIS COMPLETEWITH MICROSCOPIC ([facility] ONLY)    ____________________________________________   EKG  ED ECG REPORT I, Jene Every, the attending physician, personally viewed and interpreted this ECG.  Date: 11/15/2015 EKG Time: 11:41 AM Rate: 84 Rhythm: normal sinus rhythm QRS Axis: normal Intervals: normal ST/T Wave abnormalities: Nonspecific T changes Conduction Disturbances: none   ____________________________________________    RADIOLOGY  CT head no acute distress Chest x-ray unremarkable  ____________________________________________   PROCEDURES  Procedure(s) performed: none  Critical Care performed: none  ____________________________________________   INITIAL IMPRESSION / ASSESSMENT AND PLAN / ED COURSE  Pertinent labs & imaging results that were available during my care of the patient were reviewed by me and considered in my medical decision making (see chart for details).  Patient presents after reported syncopal or near syncopal episode, is not clear. We will check labs, EKG Place the patient on cardiac monitor and give IV fluids.  Labs are unremarkable. Patient observed in the ED and has been normotensive throughout her stay. She is at her baseline per her daughter. I discussed with her daughter her labs and imaging findings and we both agree the patient would best be served by going home at this point. Daughter agrees that there are new symptoms she will return to the emergency department.  ____________________________________________   FINAL CLINICAL IMPRESSION(S) / ED DIAGNOSES  Final diagnoses:  Near syncope  Constipation, unspecified constipation type          Jene Every, MD 11/15/15 2007

## 2015-11-15 NOTE — ED Notes (Signed)
Patient was assisted to eat a full container of applesauce and drink ginger ale.

## 2015-11-15 NOTE — ED Notes (Addendum)
Patient remains on stretcher waiting for ambulance transport. EMS have been called again.

## 2015-11-15 NOTE — ED Notes (Signed)
Patient unable to stand upright or weight-bear for orthostatic VS. Patient was repositioned on stretcher and wet brief was changed.

## 2015-11-15 NOTE — ED Notes (Signed)
Patient remains on stretcher, removed pulse ox several times. Patient is calm. Patient was informed that we are waiting for ambulance transport.

## 2015-11-15 NOTE — ED Notes (Signed)
Patient was repositioned, warm blanket given while waiting for ambulance transport home. No vomiting or diarrhea noted. Patient denies pain or nausea.

## 2015-11-15 NOTE — ED Notes (Signed)
Ambulance here for transport.

## 2015-11-15 NOTE — ED Notes (Signed)
Yellow socks put on patient.

## 2015-11-15 NOTE — ED Notes (Signed)
Unable to complete orthostatic VS per ED tech Vincenza HewsShane, because patient unable to bend to sit up. Dr. Cyril LoosenKinner aware.

## 2015-11-15 NOTE — ED Notes (Signed)
Patient left for CT scan. 

## 2015-11-15 NOTE — ED Notes (Signed)
Dr. Cyril LoosenKinner spoke with patient's daughter who is the patient's caregiver and gave discharge instructions. Patient is unable to sign e-signature.

## 2015-11-15 NOTE — ED Notes (Signed)
Patient's daughter, Windell Momentenny McGee, who is her live-in caregiver, called and gave phone number [phone].) Mila PalmerMcGee states she is going to get something to eat, but can be reached at her cell phone number if needed.

## 2015-11-15 NOTE — Discharge Instructions (Signed)

## 2016-01-04 ENCOUNTER — Other Ambulatory Visit: Payer: Self-pay

## 2016-01-04 ENCOUNTER — Emergency Department: Payer: Medicare (Managed Care)

## 2016-01-04 ENCOUNTER — Encounter: Payer: Self-pay | Admitting: *Deleted

## 2016-01-04 ENCOUNTER — Inpatient Hospital Stay
Admission: EM | Admit: 2016-01-04 | Discharge: 2016-01-07 | DRG: 689 | Disposition: A | Discharge status: Skilled Nursing Facility | Payer: Medicare (Managed Care) | Attending: Internal Medicine | Admitting: Internal Medicine

## 2016-01-04 DIAGNOSIS — R739 Hyperglycemia, unspecified: Secondary | ICD-10-CM | POA: Diagnosis present

## 2016-01-04 DIAGNOSIS — F0390 Unspecified dementia without behavioral disturbance: Secondary | ICD-10-CM | POA: Diagnosis present

## 2016-01-04 DIAGNOSIS — E890 Postprocedural hypothyroidism: Secondary | ICD-10-CM | POA: Diagnosis present

## 2016-01-04 DIAGNOSIS — R4182 Altered mental status, unspecified: Secondary | ICD-10-CM

## 2016-01-04 DIAGNOSIS — M1990 Unspecified osteoarthritis, unspecified site: Secondary | ICD-10-CM | POA: Diagnosis present

## 2016-01-04 DIAGNOSIS — G9340 Encephalopathy, unspecified: Secondary | ICD-10-CM | POA: Diagnosis present

## 2016-01-04 DIAGNOSIS — N390 Urinary tract infection, site not specified: Secondary | ICD-10-CM | POA: Diagnosis not present

## 2016-01-04 DIAGNOSIS — Z833 Family history of diabetes mellitus: Secondary | ICD-10-CM | POA: Diagnosis not present

## 2016-01-04 DIAGNOSIS — E785 Hyperlipidemia, unspecified: Secondary | ICD-10-CM | POA: Diagnosis present

## 2016-01-04 DIAGNOSIS — F0280 Dementia in other diseases classified elsewhere without behavioral disturbance: Secondary | ICD-10-CM | POA: Diagnosis present

## 2016-01-04 DIAGNOSIS — Z66 Do not resuscitate: Secondary | ICD-10-CM | POA: Diagnosis present

## 2016-01-04 DIAGNOSIS — I739 Peripheral vascular disease, unspecified: Secondary | ICD-10-CM | POA: Diagnosis present

## 2016-01-04 DIAGNOSIS — Z886 Allergy status to analgesic agent status: Secondary | ICD-10-CM | POA: Diagnosis not present

## 2016-01-04 DIAGNOSIS — G20 Parkinson's disease: Secondary | ICD-10-CM | POA: Diagnosis present

## 2016-01-04 DIAGNOSIS — Z96641 Presence of right artificial hip joint: Secondary | ICD-10-CM | POA: Diagnosis present

## 2016-01-04 DIAGNOSIS — Z79899 Other long term (current) drug therapy: Secondary | ICD-10-CM

## 2016-01-04 DIAGNOSIS — K219 Gastro-esophageal reflux disease without esophagitis: Secondary | ICD-10-CM | POA: Diagnosis present

## 2016-01-04 DIAGNOSIS — Z90710 Acquired absence of both cervix and uterus: Secondary | ICD-10-CM

## 2016-01-04 DIAGNOSIS — I10 Essential (primary) hypertension: Secondary | ICD-10-CM | POA: Diagnosis present

## 2016-01-04 DIAGNOSIS — Z9049 Acquired absence of other specified parts of digestive tract: Secondary | ICD-10-CM | POA: Diagnosis not present

## 2016-01-04 DIAGNOSIS — E039 Hypothyroidism, unspecified: Secondary | ICD-10-CM | POA: Diagnosis present

## 2016-01-04 LAB — CBC WITH DIFFERENTIAL/PLATELET
Basophils Absolute: 0.1 10*3/uL (ref 0–0.1)
Basophils Relative: 1 %
Eosinophils Absolute: 0.2 10*3/uL (ref 0–0.7)
Eosinophils Relative: 2 %
HEMATOCRIT: 43 % (ref 35.0–47.0)
HEMOGLOBIN: 14.9 g/dL (ref 12.0–16.0)
LYMPHS ABS: 1.5 10*3/uL (ref 1.0–3.6)
MCH: 33.4 pg (ref 26.0–34.0)
MCHC: 34.6 g/dL (ref 32.0–36.0)
MCV: 96.5 fL (ref 80.0–100.0)
Monocytes Absolute: 0.8 10*3/uL (ref 0.2–0.9)
Monocytes Relative: 6 %
NEUTROS ABS: 10.5 10*3/uL — AB (ref 1.4–6.5)
Platelets: 302 10*3/uL (ref 150–440)
RBC: 4.45 MIL/uL (ref 3.80–5.20)
RDW: 15 % — ABNORMAL HIGH (ref 11.5–14.5)
WBC: 13.2 10*3/uL — AB (ref 3.6–11.0)

## 2016-01-04 LAB — VITAMIN B12: VITAMIN B 12: 241 pg/mL (ref 180–914)

## 2016-01-04 LAB — URINALYSIS COMPLETE WITH MICROSCOPIC (ARMC ONLY)
BILIRUBIN URINE: NEGATIVE
Glucose, UA: NEGATIVE mg/dL
Nitrite: NEGATIVE
PH: 6 (ref 5.0–8.0)
Protein, ur: 100 mg/dL — AB
Specific Gravity, Urine: 1.013 (ref 1.005–1.030)

## 2016-01-04 LAB — COMPREHENSIVE METABOLIC PANEL
ALK PHOS: 131 U/L — AB (ref 38–126)
ALT: 5 U/L — AB (ref 14–54)
AST: 19 U/L (ref 15–41)
Albumin: 3.4 g/dL — ABNORMAL LOW (ref 3.5–5.0)
Anion gap: 9 (ref 5–15)
BUN: 14 mg/dL (ref 6–20)
CALCIUM: 8.7 mg/dL — AB (ref 8.9–10.3)
CO2: 24 mmol/L (ref 22–32)
CREATININE: 0.7 mg/dL (ref 0.44–1.00)
Chloride: 103 mmol/L (ref 101–111)
Glucose, Bld: 108 mg/dL — ABNORMAL HIGH (ref 65–99)
Potassium: 4.3 mmol/L (ref 3.5–5.1)
Sodium: 136 mmol/L (ref 135–145)
Total Bilirubin: <0.1 mg/dL — ABNORMAL LOW (ref 0.3–1.2)
Total Protein: 6.1 g/dL — ABNORMAL LOW (ref 6.5–8.1)

## 2016-01-04 LAB — TROPONIN I

## 2016-01-04 LAB — TSH: TSH: 3.111 u[IU]/mL (ref 0.350–4.500)

## 2016-01-04 MED ORDER — SODIUM CHLORIDE 0.9% FLUSH
3.0000 mL | Freq: Two times a day (BID) | INTRAVENOUS | Status: DC
  Administered 2016-01-06: 3 mL via INTRAVENOUS

## 2016-01-04 MED ORDER — SENNOSIDES-DOCUSATE SODIUM 8.6-50 MG PO TABS: 1.0000 | ORAL_TABLET | Freq: Every evening | ORAL | Status: DC | PRN

## 2016-01-04 MED ORDER — ACETAMINOPHEN 325 MG PO TABS: 650.0000 mg | ORAL_TABLET | Freq: Four times a day (QID) | ORAL | Status: DC | PRN

## 2016-01-04 MED ORDER — PRAVASTATIN SODIUM 20 MG PO TABS
20.0000 mg | ORAL_TABLET | Freq: Every day | ORAL | Status: DC
  Administered 2016-01-04 – 2016-01-06 (×3): 20 mg via ORAL
  Filled 2016-01-04 (×3): qty 1

## 2016-01-04 MED ORDER — ACETAMINOPHEN 650 MG RE SUPP: 650.0000 mg | Freq: Four times a day (QID) | RECTAL | Status: DC | PRN

## 2016-01-04 MED ORDER — ATENOLOL 50 MG PO TABS
50.0000 mg | ORAL_TABLET | Freq: Every day | ORAL | Status: DC
  Administered 2016-01-05 – 2016-01-07 (×3): 50 mg via ORAL
  Filled 2016-01-04 (×4): qty 1

## 2016-01-04 MED ORDER — ENOXAPARIN SODIUM 40 MG/0.4ML ~~LOC~~ SOLN
40.0000 mg | SUBCUTANEOUS | Status: DC
  Administered 2016-01-04 – 2016-01-06 (×3): 40 mg via SUBCUTANEOUS
  Filled 2016-01-04 (×3): qty 0.4

## 2016-01-04 MED ORDER — ONDANSETRON HCL 4 MG/2ML IJ SOLN: 4.0000 mg | Freq: Four times a day (QID) | INTRAMUSCULAR | Status: DC | PRN

## 2016-01-04 MED ORDER — SODIUM CHLORIDE 0.9 % IV SOLN
INTRAVENOUS | Status: DC
  Administered 2016-01-04 – 2016-01-05 (×2): via INTRAVENOUS

## 2016-01-04 MED ORDER — LISINOPRIL 10 MG PO TABS
10.0000 mg | ORAL_TABLET | Freq: Every day | ORAL | Status: DC
  Administered 2016-01-05 – 2016-01-07 (×3): 10 mg via ORAL
  Filled 2016-01-04 (×4): qty 1

## 2016-01-04 MED ORDER — SODIUM CHLORIDE 0.9 % IV SOLN
1000.0000 mL | Freq: Once | INTRAVENOUS | Status: AC
  Administered 2016-01-04: 1000 mL via INTRAVENOUS

## 2016-01-04 MED ORDER — LEVOTHYROXINE SODIUM 50 MCG PO TABS
100.0000 ug | ORAL_TABLET | Freq: Every day | ORAL | Status: DC
  Administered 2016-01-05 – 2016-01-07 (×2): 100 ug via ORAL
  Filled 2016-01-04 (×3): qty 2

## 2016-01-04 MED ORDER — CARBIDOPA-LEVODOPA 10-100 MG PO TABS
1.0000 | ORAL_TABLET | Freq: Two times a day (BID) | ORAL | Status: DC
  Administered 2016-01-04 – 2016-01-07 (×6): 1 via ORAL
  Filled 2016-01-04 (×7): qty 1

## 2016-01-04 MED ORDER — DEXTROSE 5 % IV SOLN
1.0000 g | INTRAVENOUS | Status: DC
  Administered 2016-01-05 – 2016-01-07 (×3): 1 g via INTRAVENOUS
  Filled 2016-01-04 (×4): qty 10

## 2016-01-04 MED ORDER — ONDANSETRON HCL 4 MG PO TABS: 4.0000 mg | ORAL_TABLET | Freq: Four times a day (QID) | ORAL | Status: DC | PRN

## 2016-01-04 MED ORDER — PRIMIDONE 250 MG PO TABS
250.0000 mg | ORAL_TABLET | Freq: Three times a day (TID) | ORAL | Status: DC
  Administered 2016-01-04 – 2016-01-07 (×9): 250 mg via ORAL
  Filled 2016-01-04 (×11): qty 1

## 2016-01-04 MED ORDER — CEFTRIAXONE SODIUM 1 G IJ SOLR
1.0000 g | Freq: Once | INTRAMUSCULAR | Status: AC
  Administered 2016-01-04: 1 g via INTRAVENOUS
  Filled 2016-01-04: qty 10

## 2016-01-04 NOTE — ED Notes (Signed)
Lab notified to add urine culture 

## 2016-01-04 NOTE — ED Notes (Signed)
Lab notified to add on Vitamin B12 level

## 2016-01-04 NOTE — Progress Notes (Signed)
Pharmacy Antibiotic Note  Brandy Oconnor is a 82 y.o. female admitted on 01/04/2016 with UTI.  Pharmacy has been consulted for ceftriaxone dosing.  Plan: Ceftriaxone 1 g iv q 24 hours.   Height: 5\' 3"  (160 cm) Weight: 112 lb 14 oz (51.2 kg) IBW/kg (Calculated) : 52.4  Temp (24hrs), Avg:97.4 F (36.3 C), Min:97.4 F (36.3 C), Max:97.4 F (36.3 C)   Recent Labs Lab 01/04/16 1205  WBC 13.2*  CREATININE 0.70    Estimated Creatinine Clearance: 43.1 mL/min (by C-G formula based on Cr of 0.7).    Allergies  Allergen Reactions  . Codeine Rash    Antimicrobials this admission: ceftriaxone 6/24 >>   Dose adjustments this admission:   Microbiology results: 6/24 UCx: pending   Thank you for allowing pharmacy to be a part of this patient's care.  Luisa HartChristy, Paralee Pendergrass D 01/04/2016 1:57 PM

## 2016-01-04 NOTE — H&P (Signed)
[facility] [place] [facility]   PATIENT NAME: Brandy DressSarah Oconnor    MR#:  [id number]  DATE OF BIRTH:  1930-12-03  DATE OF ADMISSION:  01/04/2016  PRIMARY CARE PHYSICIAN: [id number]   REQUESTING/REFERRING PHYSICIAN: Dr Mayford KnifeWilliams  CHIEF COMPLAINT:   AMS  HISTORY OF PRESENT ILLNESS:  Brandy Oconnor  is a 82 y.o. female with a known history of Hypothyroidism and essential hypertension who presents from independent living with confusion and altered mental status. Patient unable to provide information due to altered mental status. Daughter checked on patient yesterday and she was in her usual state of health. This morning when daughter checked on her patient was very confused. She was brought to the ER for further evaluation. She is started on Rocephin for urinary tract infection. CT head shows no acute intracranial abnormality.  PAST MEDICAL HISTORY:   Past Medical History  Diagnosis Date  . Hyperlipidemia   . Depression   . Osteoarthritis   . Hypothyroidism   . Hypertension   . GERD (gastroesophageal reflux disease)   . Femur fracture, right (HCC)   . Tremor     benign essential tremor- tremors of voice, head and jaw  . Dementia     PAST SURGICAL HISTORY:   Past Surgical History  Procedure Laterality Date  . Abdominal hysterectomy    . Cholecystectomy    . Appendectomy    . Thyroidectomy, partial    . Total hip arthroplasty Right 05/24/2015    Procedure: TOTAL HIP ARTHROPLASTY ANTERIOR APPROACH;  Surgeon: Kennedy BuckerMichael Menz, MD;  Location: [facility] ORS;  Service: Orthopedics;  Laterality: Right;    SOCIAL HISTORY:   Social History  Substance Use Topics  . Smoking status: Never Smoker   . Smokeless tobacco: No  . Alcohol Use: No    FAMILY HISTORY:   Family History  Problem Relation Age of Onset  . Diabetes Mellitus II Mother   . Other Father     Tremors    DRUG ALLERGIES:   Allergies  Allergen Reactions  . Codeine Rash    REVIEW OF SYSTEMS:    Review of Systems  Unable to perform ROS: acuity of condition    MEDICATIONS AT HOME:   Prior to Admission medications   Medication Sig Start Date End Date Taking? Authorizing Provider  alendronate (FOSAMAX) 70 MG tablet Take 70 mg by mouth once a week. On Monday    Historical Provider, MD  atenolol (TENORMIN) 50 MG tablet Take 50 mg by mouth daily.    Historical Provider, MD  carbidopa-levodopa (SINEMET IR) 10-100 MG tablet Take 1 tablet by mouth 2 (two) times daily.    Historical Provider, MD  enoxaparin (LOVENOX) 40 MG/0.4ML injection Inject 0.4 mLs (40 mg total) into the skin daily. 05/27/15   Dedra Skeensodd Mundy, PA-C  ketotifen (ZADITOR) 0.025 % ophthalmic solution Place 1 drop into both eyes daily.    Historical Provider, MD  levothyroxine (SYNTHROID, LEVOTHROID) 100 MCG tablet Take 100 mcg by mouth daily.    Historical Provider, MD  lisinopril (PRINIVIL,ZESTRIL) 10 MG tablet Take 10 mg by mouth daily.    Historical Provider, MD  liver oil-zinc oxide (DESITIN) 40 % ointment Apply 1 application topically as needed for irritation (after toileting).    Historical Provider, MD  loperamide (IMODIUM A-D) 2 MG tablet Take 2 mg by mouth 4 (four) times daily as needed for diarrhea or loose stools.    Historical Provider, MD  loratadine (CLARITIN) 10 MG tablet Take 10 mg by  mouth daily.    Historical Provider, MD  oxyCODONE (OXY IR/ROXICODONE) 5 MG immediate release tablet Take 1 tablet (5 mg total) by mouth once as needed (for pain score of 1-4). 05/27/15   Katha Hamming, MD  pravastatin (PRAVACHOL) 20 MG tablet Take 20 mg by mouth at bedtime.    Historical Provider, MD  primidone (MYSOLINE) 250 MG tablet Take 250 mg by mouth 3 (three) times daily. At 8am, 1pm, and bedtime    Historical Provider, MD  traMADol (ULTRAM) 50 MG tablet Take 1 tablet (50 mg total) by mouth every 6 (six) hours as needed for moderate pain or severe pain. 05/27/15   Dedra Skeens, PA-C  trospium (SANCTURA) 20 MG tablet Take  20 mg by mouth daily.    Historical Provider, MD  Vitamin D, Ergocalciferol, (DRISDOL) 50000 UNITS CAPS capsule Take 50,000 Units by mouth every 30 (thirty) days. Once a month on the first Monday    Historical Provider, MD      VITAL SIGNS:  Blood pressure 118/84, pulse 75, temperature 97.4 F (36.3 C), temperature source Oral, resp. rate 11, height  (1.6 m), weight 51.2 kg (112 lb 14 oz), SpO2 97 %.  PHYSICAL EXAMINATION:   Physical Exam  Constitutional: She is well-developed, well-nourished, and in no distress. No distress.  HENT:  Head: Normocephalic.  Eyes: No scleral icterus.  Neck: Neck supple. No JVD present. No tracheal deviation present. No thyromegaly present.  Cardiovascular: Normal rate, regular rhythm and normal heart sounds.  Exam reveals no gallop and no friction rub.   No murmur heard. Pulmonary/Chest: Effort normal and breath sounds normal. No respiratory distress. She has no wheezes. She has no rales. She exhibits no tenderness.  Abdominal: Soft. Bowel sounds are normal. She exhibits no distension and no mass. There is no tenderness. There is no rebound and no guarding.  Musculoskeletal: Normal range of motion. She exhibits no edema.  Neurological: She is alert. She has normal reflexes. She displays normal reflexes. She exhibits normal muscle tone.  Mumbling Does not follow commands well  Skin: Skin is warm. No rash noted. No erythema.      LABORATORY PANEL:   CBC  Recent Labs Lab 01/04/16 1205  WBC 13.2*  HGB 14.9  HCT 43.0  PLT 302   ------------------------------------------------------------------------------------------------------------------  Chemistries   Recent Labs Lab 01/04/16 1205  NA 136  K 4.3  CL 103  CO2 24  GLUCOSE 108*  BUN 14  CREATININE 0.70  CALCIUM 8.7*  AST 19  ALT 5*  ALKPHOS 131*  BILITOT <0.1*    ------------------------------------------------------------------------------------------------------------------  Cardiac Enzymes  Recent Labs Lab 01/04/16 1205  TROPONINI <0.03   ------------------------------------------------------------------------------------------------------------------  RADIOLOGY:  Dg Chest 1 View  01/04/2016  CLINICAL DATA:  Altered mental status, history of dementia EXAM: CHEST 1 VIEW COMPARISON:  11/15/2015 FINDINGS: Cardiomediastinal silhouette is stable. No infiltrate or pleural effusion. No pulmonary edema. Bony thorax is unremarkable. IMPRESSION: No active disease. Electronically Signed   By: Natasha Mead M.D.   On: 01/04/2016 12:16   Ct Head Wo Contrast  01/04/2016  CLINICAL DATA:  Altered mental status, history of dementia EXAM: CT HEAD WITHOUT CONTRAST TECHNIQUE: Contiguous axial images were obtained from the base of the skull through the vertex without intravenous contrast. COMPARISON:  11/15/2015 FINDINGS: Brain: No intracranial hemorrhage, mass effect or midline shift. No definite acute cortical infarction. Stable atrophy and chronic white matter disease. Ventricular size is stable from prior exam no intra or extra-axial fluid collection. No  mass lesion is noted on this unenhanced scan. Vascular: Mild atherosclerotic calcifications of carotid siphon. Skull: No skull fracture is noted. Sinuses/Orbits: No intraorbital hematoma. No paranasal sinuses air-fluid levels. Other: None IMPRESSION: No acute intracranial abnormality. Stable atrophy and chronic white matter disease. No definite acute cortical infarction. Electronically Signed   By: Natasha MeadLiviu  Pop M.D.   On: 01/04/2016 12:18    EKG:  Sinus rhythm no ST elevation   IMPRESSION AND PLAN:   82 year old female from independent living facility presents with acute encephalopathy.   1. Acute encephalopathy: This is due to urinary tract infection.   neuro checks every 4 hours. Treat UTI with Rocephin and  follow up on urine culture. Check TSH and B12.  2. Urinary tract infection: Continue Rocephin and follow up on urine culture.  3. Hypothyroidism: Continue Synthroid and check TSH.  4. Essential hypertension: Continue lisinopril.    All the records are reviewed and case discussed with ED provider.   CODE STATUS: DNR  TOTAL TIME TAKING CARE OF THIS PATIENT: 45 minutes.    Brandy Oconnor M.D on 01/04/2016 at 1:42 PM  Between 7am to 6pm - Pager - [phone]  After 6pm go to [email or web address] - Social research officer, governmentpassword EPAS [facility]  [facility]  Office  [phone]  CC: Primary care physician; No primary care provider on file.

## 2016-01-04 NOTE — Progress Notes (Signed)
Unable to complete admission d/t patient's dementia. ED said daughter was here but she did not come with mom to 1A. Nurse will try to complete later if family member comes to visit.

## 2016-01-04 NOTE — Clinical Social Work Note (Signed)
Clinical Social Work Assessment  Patient Details  Name: Brandy Oconnor MRN: [id number] Date of Birth: 1930-09-09  Date of referral:  01/04/16               Reason for consult:  Facility Placement                Permission sought to share information with:  Family Supports, Customer service manager Permission granted to share information::  Yes, Verbal Permission Granted  Name::     Arbie Cookey [phone] ( daughter)  Agency::  [facility]  Relationship::     Contact Information:     Housing/Transportation Living arrangements for the past 2 months:  Apartment Source of Information:  Patient, Facility, Other (Comment Required) Field seismologist) Patient Interpreter Needed:  None Criminal Activity/Legal Involvement Pertinent to Current Situation/Hospitalization:  No - Comment as needed Significant Relationships:  Adult Children, Warehouse manager, Other(Comment) ([facility] PROGRAM) Lives with:  Adult Children, Self Do you feel safe going back to the place where you live?  Yes Need for family participation in patient care:  Yes (Comment)  Care giving concerns: Patient was verbal now she is not, her status has changed   Facilities manager / plan: LCSW met with patient and charge nurse for [facility] program. Patient was non verbal but appeared to understand what was asked and noded yes /no to simple questions, the remaining information collected was provided by PACE coordinater. This patient lives with her daughter and attends the [facility] program 3x week. She has 2 aids that come in 2x a day plus a nurse daily. It is the patients plan to return home with PACE program. Patient has generic medicare advantage/medicaid of [place]. Patient has Parkinson's disease and her hands are both contractural.Patient required full ADL support and is a DNR.   Employment status:  Disabled (Comment on whether or not currently receiving Disability) (Parkinsons) Insurance information:  Medicare, Medicaid In  [employer] (Generic medicare advantage) PT Recommendations:  Not assessed at this time ([facility] PT) Information / Referral to community resources:   SNF only if required  Patient/Family's Response to care: Please call me if you need me ( this was instructed to [facility] program)  Patient/Family's Understanding of and Emotional Response to Diagnosis, Current Treatment, and Prognosis:  Patient unable to verbalize but seemed to understand she was being admitted.  Emotional Assessment Appearance:  Appears stated age Attitude/Demeanor/Rapport:   (Polite and calm and able to understand simple commends and respond by blinking and nodding, hard of hearing) Affect (typically observed):    Orientation:  Oriented to Self, Oriented to Place, Oriented to Situation Alcohol / Substance use:  Never Used Psych involvement (Current and /or in the community):  No (Comment)  Discharge Needs  Concerns to be addressed:  Care Coordination Readmission within the last 30 days:  No Current discharge risk:  None Barriers to Discharge:  No Barriers Identified, Continued Medical Work up   [place], [place], LCSW 01/04/2016, 3:01 PM

## 2016-01-04 NOTE — ED Notes (Signed)
Patient transported to X-ray 

## 2016-01-04 NOTE — ED Notes (Signed)
Per daughter pt has been altered since this am. Hx dementia but will usually talk to you. Pt responsive to stimuli

## 2016-01-04 NOTE — Progress Notes (Signed)
Spoke to daughter and finished admission. She is her mother's caregiver and lives with her.

## 2016-01-04 NOTE — Progress Notes (Signed)
LCSW met with patient and her [facility] charge nurse. LCSW was able to communicate with patient but was non verbal. She was able to understand simple questions and Nod Yes/No. Patient is involved with the [facility] Program and will return home with her daughter when discharged. Patient was verbal 2 days ago but it appears she  Now struggles to speak according to provider. LCSW to complete assessment and will advise EDP.  [employer] LCSW [phone]

## 2016-01-04 NOTE — ED Provider Notes (Signed)
[facility] Emergency Department Provider Note      L5 caveat: Review of systems and history is limited by altered mental status  Time seen: ----------------------------------------- 11:30 AM on 01/04/2016 -----------------------------------------    I have reviewed the triage vital signs and the nursing notes.   HISTORY  Chief Complaint No chief complaint on file.    HPI Brandy Oconnor is a 82 y.o. female who presents to the ER for altered mental status. According to reports [facility]. She has a baseline of dementia but normally talks. Now she is not talking. Patient is unable to provide further review of systems or report.   Past Medical History  Diagnosis Date  . Hyperlipidemia   . Depression   . Osteoarthritis   . Hypothyroidism   . Hypertension   . GERD (gastroesophageal reflux disease)   . Femur fracture, right (HCC)   . Tremor     benign essential tremor- tremors of voice, head and jaw  . Dementia     Patient Active Problem List   Diagnosis Date Noted  . Hip fracture (HCC) 05/23/2015    Past Surgical History  Procedure Laterality Date  . Abdominal hysterectomy    . Cholecystectomy    . Appendectomy    . Thyroidectomy, partial    . Total hip arthroplasty Right 05/24/2015    Procedure: TOTAL HIP ARTHROPLASTY ANTERIOR APPROACH;  Surgeon: Kennedy BuckerMichael Menz, MD;  Location: [facility] ORS;  Service: Orthopedics;  Laterality: Right;    Allergies Codeine  Social History Social History  Substance Use Topics  . Smoking status: Never Smoker   . Smokeless tobacco: Not on file  . Alcohol Use: No    Review of Systems Unknown at this time  ____________________________________________   PHYSICAL EXAM:  VITAL SIGNS: ED Triage Vitals  Enc Vitals Group     BP --      Pulse --      Resp --      Temp --      Temp src --      SpO2 --      Weight --      Height --      Head Cir --      Peak Flow --      Pain Score --      Pain Loc --      Pain Edu? --      Excl. in GC? --     Constitutional: Lethargic, disoriented. Eyes: Conjunctivae are normal. PERRL. Normal extraocular movements. ENT   Head: Normocephalic and atraumatic.   Nose: No congestion/rhinnorhea.   Mouth/Throat: Mucous membranes are dry   Neck: No stridor. Cardiovascular: Normal rate, regular rhythm. No murmurs, rubs, or gallops. Respiratory: Normal respiratory effort without tachypnea nor retractions. Breath sounds are clear and equal bilaterally. No wheezes/rales/rhonchi. Gastrointestinal: Soft and nontender. Normal bowel sounds Musculoskeletal: Nontender with normal range of motion in all extremities. No lower extremity tenderness nor edema. Neurologic:  Generalized weakness, patient has her head to questions. Skin:  Skin is warm, dry and intact. No rash noted. ____________________________________________  EKG: Interpreted by me. Sinus rhythm rate of 72 bpm, normal PR interval, normal QRS, normal QT interval. Normal axis.  ____________________________________________  ED COURSE:  Pertinent labs & imaging results that were available during my care of the patient were reviewed by me and considered in my medical decision making (see chart for details). Patient resents to ER with altered mental status, we will assess with basic  labs and imaging. ____________________________________________    LABS (pertinent positives/negatives)  Labs Reviewed  CBC WITH DIFFERENTIAL/PLATELET - Abnormal; Notable for the following:    WBC 13.2 (*)    RDW 15.0 (*)    Neutro Abs 10.5 (*)    All other components within normal limits  COMPREHENSIVE METABOLIC PANEL - Abnormal; Notable for the following:    Glucose, Bld 108 (*)    Calcium 8.7 (*)    Total Protein 6.1 (*)    Albumin 3.4 (*)    ALT 5 (*)    Alkaline Phosphatase 131 (*)    Total Bilirubin <0.1 (*)    All other components within normal limits  URINALYSIS COMPLETEWITH  MICROSCOPIC ([facility] ONLY) - Abnormal; Notable for the following:    Color, Urine AMBER (*)    APPearance TURBID (*)    Ketones, ur TRACE (*)    Hgb urine dipstick 2+ (*)    Protein, ur 100 (*)    Leukocytes, UA 3+ (*)    Bacteria, UA FEW (*)    Squamous Epithelial / LPF 6-30 (*)    All other components within normal limits  URINE CULTURE  TROPONIN I    RADIOLOGY Images were viewed by me  CT head, chest x-ray  IMPRESSION: No active disease. IMPRESSION: No acute intracranial abnormality. Stable atrophy and chronic white matter disease. No definite acute cortical infarction. CT renal protocol  ____________________________________________  FINAL ASSESSMENT AND PLAN  Altered mental status, UTI  Plan: Patient with labs and imaging as dictated above. Patient with altered mental status and lethargy secondary to UTI. We have sent a urine culture, she's been given IV Rocephin. Patient still appears very lethargic after fluids and antibiotics. We will recommend observation.   Emily FilbertWilliams, Jonathan E, MD   Note: This dictation was prepared with Dragon dictation. Any transcriptional errors that result from this process are unintentional   Emily FilbertJonathan E Williams, MD 01/04/16 1247

## 2016-01-05 LAB — BASIC METABOLIC PANEL
ANION GAP: 8 (ref 5–15)
BUN: 10 mg/dL (ref 6–20)
CALCIUM: 8 mg/dL — AB (ref 8.9–10.3)
CO2: 23 mmol/L (ref 22–32)
Chloride: 111 mmol/L (ref 101–111)
Creatinine, Ser: 0.65 mg/dL (ref 0.44–1.00)
Glucose, Bld: 87 mg/dL (ref 65–99)
POTASSIUM: 3.7 mmol/L (ref 3.5–5.1)
Sodium: 142 mmol/L (ref 135–145)

## 2016-01-05 LAB — CBC
HEMATOCRIT: 39.7 % (ref 35.0–47.0)
Hemoglobin: 13.4 g/dL (ref 12.0–16.0)
MCH: 33.1 pg (ref 26.0–34.0)
MCHC: 33.8 g/dL (ref 32.0–36.0)
MCV: 98.1 fL (ref 80.0–100.0)
Platelets: 242 10*3/uL (ref 150–440)
RBC: 4.05 MIL/uL (ref 3.80–5.20)
RDW: 14.9 % — AB (ref 11.5–14.5)
WBC: 7.6 10*3/uL (ref 3.6–11.0)

## 2016-01-05 LAB — URINE CULTURE: Special Requests: NORMAL

## 2016-01-05 MED ORDER — KCL IN DEXTROSE-NACL 20-5-0.45 MEQ/L-%-% IV SOLN
INTRAVENOUS | Status: DC
  Administered 2016-01-05 – 2016-01-07 (×3): via INTRAVENOUS
  Filled 2016-01-05 (×8): qty 1000

## 2016-01-05 NOTE — Progress Notes (Signed)
[facility] [place] [facility]   PATIENT NAME: Brandy DressSarah Oconnor    MR#:  [id number]  DATE OF BIRTH:  Apr 07, 1931  SUBJECTIVE:  CHIEF COMPLAINT:   Chief Complaint  Patient presents with  . Altered Mental Status   Patient is [age] year old Caucasian female with past medical history significant for history of hypothyroidism and essential hypertension who presented from independent living facility with confusion and altered mental status. Apparently patient was seen yesterday and she was in her usual state of health, however, on the day of admission she was very confused and was brought to emergency room for further evaluation. In emergency room, her labs revealed leukocytosis, pyuria, concerning for urinary tract infection, patient was admitted. Per patient's daughter, patient is declining physically and mentally recently. Patient feels comfortable today. Denies any pain, dysuria. Remains somewhat somnolent and has difficulty verbalizing words, slurry speech Review of Systems  Unable to perform ROS: dementia    VITAL SIGNS: Blood pressure 142/65, pulse 79, temperature 97.9 F (36.6 C), temperature source Oral, resp. rate 20, height 5\' 3"  (1.6 m), weight 51.2 kg (112 lb 14 oz), SpO2 98 %.  PHYSICAL EXAMINATION:   GENERAL:  82 y.o.-year-old patient lying in the bed with no acute distress, slow to respond to verbal stimuli, however, attempts to verbalize. Masklike face EYES: Pupils equal, round, reactive to light and accommodation. No scleral icterus. Extraocular muscles intact.  HEENT: Head atraumatic, normocephalic. Oropharynx and nasopharynx clear.  NECK:  Supple, no jugular venous distention. No thyroid enlargement, no tenderness.  LUNGS: Normal breath sounds bilaterally, no wheezing, rales,rhonchi or crepitation. No use of accessory muscles of respiration.  CARDIOVASCULAR: S1, S2 normal. No murmurs, rubs, or gallops.  ABDOMEN: Soft, nontender, nondistended. Bowel  sounds present. No organomegaly or mass.  EXTREMITIES: No pedal edema, cyanosis, or clubbing.  NEUROLOGIC: Cranial nerves II through XII are intact. Muscle strength 3/5 in all extremities. Sensation unable to evaluate. Gait not checked. Minimal bodily or extremity movements, some slurry speech, difficulty to verbalize, slow movements. Some cogwheel rigidity.  PSYCHIATRIC: The patient is alert and oriented x 1.  SKIN: No obvious rash, lesion, or ulcer.   ORDERS/RESULTS REVIEWED:   CBC  Recent Labs Lab 01/04/16 1205 01/05/16 0355  WBC 13.2* 7.6  HGB 14.9 13.4  HCT 43.0 39.7  PLT 302 242  MCV 96.5 98.1  MCH 33.4 33.1  MCHC 34.6 33.8  RDW 15.0* 14.9*  LYMPHSABS 1.5  --   MONOABS 0.8  --   EOSABS 0.2  --   BASOSABS 0.1  --    ------------------------------------------------------------------------------------------------------------------  Chemistries   Recent Labs Lab 01/04/16 1205 01/05/16 0355  NA 136 142  K 4.3 3.7  CL 103 111  CO2 24 23  GLUCOSE 108* 87  BUN 14 10  CREATININE 0.70 0.65  CALCIUM 8.7* 8.0*  AST 19  --   ALT 5*  --   ALKPHOS 131*  --   BILITOT <0.1*  --    ------------------------------------------------------------------------------------------------------------------ estimated creatinine clearance is 43.1 mL/min (by C-G formula based on Cr of 0.65). ------------------------------------------------------------------------------------------------------------------  Recent Labs  01/04/16 1205  TSH 3.111    Cardiac Enzymes  Recent Labs Lab 01/04/16 1205  TROPONINI <0.03   ------------------------------------------------------------------------------------------------------------------ Invalid input(s): POCBNP ---------------------------------------------------------------------------------------------------------------  RADIOLOGY: Dg Chest 1 View  01/04/2016  CLINICAL DATA:  Altered mental status, history of dementia EXAM: CHEST 1 VIEW  COMPARISON:  11/15/2015 FINDINGS: Cardiomediastinal silhouette is stable. No infiltrate or pleural effusion. No pulmonary edema. Bony thorax  is unremarkable. IMPRESSION: No active disease. Electronically Signed   By: Natasha Mead M.D.   On: 01/04/2016 12:16   Ct Head Wo Contrast  01/04/2016  CLINICAL DATA:  Altered mental status, history of dementia EXAM: CT HEAD WITHOUT CONTRAST TECHNIQUE: Contiguous axial images were obtained from the base of the skull through the vertex without intravenous contrast. COMPARISON:  11/15/2015 FINDINGS: Brain: No intracranial hemorrhage, mass effect or midline shift. No definite acute cortical infarction. Stable atrophy and chronic white matter disease. Ventricular size is stable from prior exam no intra or extra-axial fluid collection. No mass lesion is noted on this unenhanced scan. Vascular: Mild atherosclerotic calcifications of carotid siphon. Skull: No skull fracture is noted. Sinuses/Orbits: No intraorbital hematoma. No paranasal sinuses air-fluid levels. Other: None IMPRESSION: No acute intracranial abnormality. Stable atrophy and chronic white matter disease. No definite acute cortical infarction. Electronically Signed   By: Natasha Mead M.D.   On: 01/04/2016 12:18   Ct Renal Stone Study  01/04/2016  CLINICAL DATA:  Hematuria.  Altered mental status. EXAM: CT ABDOMEN AND PELVIS WITHOUT CONTRAST TECHNIQUE: Multidetector CT imaging of the abdomen and pelvis was performed following the standard protocol without IV contrast. COMPARISON:  None. FINDINGS: Lower chest: Dependent densities at the lung bases probably represent atelectasis. No large pleural effusions. Hepatobiliary: Gallbladder has been removed. Normal appearance of the liver. Pancreas: Normal appearance of the pancreas without inflammation or duct dilatation. Spleen: Normal appearance of spleen without enlargement. Adrenals/Urinary Tract: Normal appearance of the adrenal glands. Diverticulum along the left side of  the bladder. No evidence for kidney stones or hydronephrosis. Normal appearance of both kidneys. Probable 1 cm exophytic cyst involving the anterior right kidney. Artifact in the pelvis related to the right hip replacement. Question a few punctate hyperdense renal cysts. Stomach/Bowel: Normal appearance of stomach and duodenum. Large amount of stool in the rectum. There is some perirectal edema. Extensive diverticulosis involving the sigmoid colon. Mild pericolonic stranding involving the proximal sigmoid colon and descending colon. Vascular/Lymphatic: Atherosclerotic calcifications in the aorta without aneurysm. No suspicious lymphadenopathy in the abdomen or pelvis. Reproductive: Uterus is surgically absent. Ovarian tissue is not confidently identified but no evidence for an adnexal mass. Other: There is perirectal edema but no significant free fluid. Negative for free air. Musculoskeletal: Right hip arthroplasty is located. Vacuum disc and endplate disease at L3-L4. IMPRESSION: No CT findings to explain hematuria. Diverticulosis with mild pericolonic stranding around the sigmoid colon and left colon. Unclear if this stranding represents an acute or chronic process. Acute diverticulitis cannot be excluded. There is mild perirectal edema. Bladder diverticulum. Electronically Signed   By: Richarda Overlie M.D.   On: 01/04/2016 13:40    EKG:  Orders placed or performed during the hospital encounter of 01/04/16  . ED EKG  . ED EKG    ASSESSMENT AND PLAN:  Active Problems:   Encephalopathy acute #1 acute encephalopathy, likely due to urinary tract infection, seems to be at baseline, per patient's nurse is, we discussed with patient's daughter. Get a speech therapist evaluate patient, initiated her on dysphagia diet due to concerns of aspiration, supportive therapy, continue IV fluids. TSH was normal at 3.11 #2. Urinary tract infection, urine culture is pending, continue patient on the Rocephin #3.  Leukocytosis, resolved with antibiotic therapy #4. Hyperglycemia, resolved #5. Parkinson's disease, continue outpatient medications #6. Hypothyroidism, continue Synthroid, TSH was checked at that was normal #7. Essential hypertension, poorly controlled due to IV fluids, change IV fluids to D5 half-normal  saline #8. Generalized weakness, get physical therapist involved for recommendations  Management plans discussed with the patient, family and they are in agreement.   DRUG ALLERGIES:  Allergies  Allergen Reactions  . Codeine Rash    CODE STATUS:     Code Status Orders        Start     Ordered   01/04/16 1342  Do not attempt resuscitation (DNR)   Continuous    Question Answer Comment  In the event of cardiac or respiratory ARREST Do not call a "code blue"   In the event of cardiac or respiratory ARREST Do not perform Intubation, CPR, defibrillation or ACLS   In the event of cardiac or respiratory ARREST Use medication by any route, position, wound care, and other measures to relive pain and suffering. May use oxygen, suction and manual treatment of airway obstruction as needed for comfort.      01/04/16 1341    Code Status History    Date Active Date Inactive Code Status Order ID Comments User Context   05/24/2015  5:03 PM 05/27/2015  5:33 PM Full Code [id number]  Kennedy BuckerMichael Menz, MD Inpatient   05/23/2015  7:54 PM 05/24/2015  5:03 PM Full Code [id number]  Enid Baasadhika Kalisetti, MD Inpatient    Advance Directive Documentation        Most Recent Value   Type of Advance Directive  Out of facility DNR (pink MOST or yellow form), Healthcare Power of Attorney   Pre-existing out of facility DNR order (yellow form or pink MOST form)  Yellow form placed in chart (order not valid for inpatient use)   "MOST" Form in Place?        TOTAL TIME TAKING CARE OF THIS PATIENT: 35 minutes.    Katharina CaperVAICKUTE,Kyston Gonce M.D on 01/05/2016 at 1:38 PM  Between 7am to 6pm - Pager - [phone]  After 6pm go to  [email or web address] - password [email or web address]  [facility]  Office  [phone]  CC: Primary care physician; No primary care provider on file.

## 2016-01-06 DIAGNOSIS — G9340 Encephalopathy, unspecified: Secondary | ICD-10-CM

## 2016-01-06 MED ORDER — NYSTATIN 100000 UNIT/ML MT SUSP
5.0000 mL | Freq: Four times a day (QID) | OROMUCOSAL | Status: DC
  Administered 2016-01-06 – 2016-01-07 (×3): 500000 [IU] via ORAL
  Filled 2016-01-06 (×2): qty 5

## 2016-01-06 NOTE — Progress Notes (Signed)
[facility] - [place] [facility]   PATIENT NAME: Brandy Oconnor    MR#:  [id number]  DATE OF BIRTH:  02-Nov-1930  SUBJECTIVE:  CHIEF COMPLAINT:   Chief Complaint  Patient presents with  . Altered Mental Status   Patient is 82 year old Caucasian female with past medical history significant for history of hypothyroidism and essential hypertension who presented from independent living facility with confusion and altered mental status. Apparently patient was seen yesterday and she was in her usual state of health, however, on the day of admission she was very confused and was brought to emergency room for further evaluation. In emergency room, her labs revealed leukocytosis, pyuria, concerning for urinary tract infection, patient was admitted. Per patient's daughter, patient is declining physically and mentally recently. Patient feels comfortable today. Denies any pain, dysuria. Remains somewhat somnolent and has difficulty verbalizing words, slurry speech Review of Systems  Unable to perform ROS: dementia    VITAL SIGNS: Blood pressure 138/67, pulse 71, temperature 97.3 F (36.3 C), temperature source Oral, resp. rate 16, height 5\' 3"  (1.6 m), weight 51.2 kg (112 lb 14 oz), SpO2 98 %.  PHYSICAL EXAMINATION:   GENERAL:  82 y.o.-year-old patient lying in the bed with no acute distress, slow to respond to verbal stimuli, however, attempts to verbalize.  EYES: Pupils equal, round, reactive to light and accommodation. No scleral icterus. Extraocular muscles intact.  HEENT: Head atraumatic, normocephalic. Oropharynx and nasopharynx clear.  NECK:  Supple, no jugular venous distention. No thyroid enlargement, no tenderness.  LUNGS: Normal breath sounds bilaterally, no wheezing, rales,rhonchi or crepitation. No use of accessory muscles of respiration.  CARDIOVASCULAR: S1, S2 normal. No murmurs, rubs, or gallops.  ABDOMEN: Soft, nontender, nondistended. Bowel sounds present.  No organomegaly or mass.  EXTREMITIES: No pedal edema, cyanosis, or clubbing.  NEUROLOGIC: Cranial nerves II through XII are intact. Muscle strength 3/5 in all extremities. Sensation unable to evaluate. Gait not checked.   PSYCHIATRIC: The patient is alert and oriented x 1.  SKIN: No obvious rash, lesion, or ulcer.   ORDERS/RESULTS REVIEWED:   CBC  Recent Labs Lab 01/04/16 1205 01/05/16 0355  WBC 13.2* 7.6  HGB 14.9 13.4  HCT 43.0 39.7  PLT 302 242  MCV 96.5 98.1  MCH 33.4 33.1  MCHC 34.6 33.8  RDW 15.0* 14.9*  LYMPHSABS 1.5  --   MONOABS 0.8  --   EOSABS 0.2  --   BASOSABS 0.1  --    ------------------------------------------------------------------------------------------------------------------  Chemistries   Recent Labs Lab 01/04/16 1205 01/05/16 0355  NA 136 142  K 4.3 3.7  CL 103 111  CO2 24 23  GLUCOSE 108* 87  BUN 14 10  CREATININE 0.70 0.65  CALCIUM 8.7* 8.0*  AST 19  --   ALT 5*  --   ALKPHOS 131*  --   BILITOT <0.1*  --    ------------------------------------------------------------------------------------------------------------------ estimated creatinine clearance is 43.1 mL/min (by C-G formula based on Cr of 0.65). ------------------------------------------------------------------------------------------------------------------  Recent Labs  01/04/16 1205  TSH 3.111    Cardiac Enzymes  Recent Labs Lab 01/04/16 1205  TROPONINI <0.03   ------------------------------------------------------------------------------------------------------------------ Invalid input(s): POCBNP ---------------------------------------------------------------------------------------------------------------  RADIOLOGY: No results found.  EKG:  Orders placed or performed during the hospital encounter of 01/04/16  . ED EKG  . ED EKG    ASSESSMENT AND PLAN:  Active Problems:   Encephalopathy acute #1 acute encephalopathy, Suspected due to urinary tract  infection, However, patient's urinary cultures came back. Numerous bacterial  elements, recollection was suggested. Urine cultures were recollected today. Patient was rehydrated and she returned back to baseline,  discussed with patient's daughter. Appreciate speech therapist input, continue dysphagia 1 diet due to concerns of aspiration, supportive care, continue IV fluids. TSH was normal at 3.11 #2. Urinary tract infection, initial urine culture showed multiple bacterial elements, recollected, continue patient on the Rocephin for now until cultures unknown #3. Leukocytosis, resolved with antibiotic therapy #4. Hyperglycemia, resolved #5. Dementia, patient was seen by neurologist, supportive therapy was recommended, including antiplatelet therapy for small vessel disease, continue Pravachol #6. Hypothyroidism, continue Synthroid, TSH was checked and was normal #7. Essential hypertension, poorly controlled due to IV fluids, better on D5 half-normal saline, continue current medications #8. Generalized weakness, physical therapist recommended skilled nursing facility placement for rehabilitation, discussed with patient's daughter, she is agreeable, getting social work involved for bed search  Management plans discussed with the patient, family and they are in agreement.   DRUG ALLERGIES:  Allergies  Allergen Reactions  . Codeine Rash    CODE STATUS:     Code Status Orders        Start     Ordered   01/04/16 1342  Do not attempt resuscitation (DNR)   Continuous    Question Answer Comment  In the event of cardiac or respiratory ARREST Do not call a "code blue"   In the event of cardiac or respiratory ARREST Do not perform Intubation, CPR, defibrillation or ACLS   In the event of cardiac or respiratory ARREST Use medication by any route, position, wound care, and other measures to relive pain and suffering. May use oxygen, suction and manual treatment of airway obstruction as needed for  comfort.      01/04/16 1341    Code Status History    Date Active Date Inactive Code Status Order ID Comments User Context   05/24/2015  5:03 PM 05/27/2015  5:33 PM Full Code [id number]  Kennedy BuckerMichael Menz, MD Inpatient   05/23/2015  7:54 PM 05/24/2015  5:03 PM Full Code [id number]  Enid Baasadhika Kalisetti, MD Inpatient    Advance Directive Documentation        Most Recent Value   Type of Advance Directive  Out of facility DNR (pink MOST or yellow form), Healthcare Power of Attorney   Pre-existing out of facility DNR order (yellow form or pink MOST form)  Yellow form placed in chart (order not valid for inpatient use)   "MOST" Form in Place?        TOTAL TIME TAKING CARE OF THIS PATIENT: 45 minutes.  Discussed with Dr. Emmaline Lifeaynolds and patient's daughter twice  Kei Mcelhiney M.D on 01/06/2016 at 4:21 PM  Between 7am to 6pm - Pager - [phone]  After 6pm go to [email or web address] - password [email or web address] [email or web address]  [facility]  Office  [phone]  CC: Primary care physician; No primary care provider on file.

## 2016-01-06 NOTE — Clinical Social Work Placement (Signed)
   CLINICAL SOCIAL WORK PLACEMENT  NOTE  Date:  01/06/2016  Patient Details  Name: Brandy DressSarah Deharo MRN: [id number] Date of Birth: [birth year]  Clinical Social Work is seeking post-discharge placement for this patient at the Skilled  Nursing Facility level of care (*CSW will initial, date and re-position this form in  chart as items are completed):  Yes   Patient/family provided with [facility] Clinical Social Work Department's list of facilities offering this level of care within the geographic area requested by the patient (or if unable, by the patient's family).  Yes   Patient/family informed of their freedom to choose among providers that offer the needed level of care, that participate in Medicare, Medicaid or managed care program needed by the patient, have an available bed and are willing to accept the patient.  Yes   Patient/family informed of [facility]'s ownership interest in [facility] and [facility], as well as of the fact that they are under no obligation to receive care at these facilities.  PASRR submitted to EDS on       PASRR number received on       Existing PASRR number confirmed on 01/06/16     FL2 transmitted to all facilities in geographic area requested by pt/family on 01/06/16     FL2 transmitted to all facilities within larger geographic area on       Patient informed that his/her managed care company has contracts with or will negotiate with certain facilities, including the following:            Patient/family informed of bed offers received.  Patient chooses bed at       Physician recommends and patient chooses bed at      Patient to be transferred to   on  .  Patient to be transferred to facility by       Patient family notified on   of transfer.  Name of family member notified:        PHYSICIAN       Additional Comment:    _______________________________________________ Haig ProphetMorgan, Shaquavia Whisonant G, LCSW 01/06/2016, 1:52 PM

## 2016-01-06 NOTE — Progress Notes (Signed)
PT is recommending SNF. Clinical Social Worker (CSW) met with patient to discuss D/C plan. Patient was oriented to self only. CSW contacted patient's daughter Kieth Brightly and made her aware of above. Daughter is agreeable to SNF search and prefers [facility]. CSW contacted [employer] social worker and made her aware of above. Per [facility] team is meeting a 3 pm today and will determine if SNF is appropriate. CSW will continue to follow and assist as needed.   Blima Rich, LCSW [phone]

## 2016-01-06 NOTE — Evaluation (Signed)
Physical Therapy Evaluation Patient Details Name: Nicoletta DressSarah Antigua MRN: [id number] DOB: 05-Jul-1931 Today's Date: 01/06/2016   History of Present Illness  82 yo F presented to ED for confusion and AMS found to have acute encephalopathy. PMH includes dementia and parkinson's disease.   Clinical Impression  Pt demonstrated generalized weakness and difficulty walking during evaluation. Due to her cognitive deficits, pt required increased time to respond to conversation or to perform tasks. Pt is a poor historian and unable to give information on her home set up or PLOF, but stated she walked with a FWW. She is very pleasant and cooperative. Pt required mod A for bed mobility. To perform transfers or ambulation limited to 3 ft with FWW, required mod A +2. Cues given for set up and technique to perform safely. Very small shuffling steps from bed to chair. STR recommended after hospital discharge to address deficits of strength, gait, balance and transfers to progress towards PLOF. Pt will benefit from skilled PT services to increase functional I and mobility for safe discharge.     Follow Up Recommendations SNF    Equipment Recommendations  Other (comment);Rolling walker with 5" wheels (pt will need FWW, unsure if she has one)    Recommendations for Other Services       Precautions / Restrictions Precautions Precautions: Fall Restrictions Weight Bearing Restrictions: No      Mobility  Bed Mobility Overal bed mobility: Needs Assistance Bed Mobility: Supine to Sit     Supine to sit: Mod assist;HOB elevated     General bed mobility comments: tactile cues to guide pt's hand to rail, cues for moving LEs and getting trunk upright  Transfers Overall transfer level: Needs assistance Equipment used: Rolling walker (2 wheeled) Transfers: Sit to/from [employer] CorporationStand;Stand Pivot Transfers Sit to Stand: Mod assist;+2 physical assistance;From elevated surface Stand pivot transfers: Mod assist;+2 physical  assistance;From elevated surface       General transfer comment: cues for hand placement, anterior weight shift to improve balance  Ambulation/Gait Ambulation/Gait assistance: Mod assist;+2 physical assistance Ambulation Distance (Feet): 3 Feet Assistive device: Rolling walker (2 wheeled) Gait Pattern/deviations: Step-to pattern;Shuffle;Trunk flexed;Narrow base of support Gait velocity: reduced Gait velocity interpretation: Below normal speed for age/gender General Gait Details: Cues for staying inside FWW and to increase step length  Stairs            Wheelchair Mobility    Modified Rankin (Stroke Patients Only)       Balance Overall balance assessment: Needs assistance Sitting-balance support: Bilateral upper extremity supported;Feet supported Sitting balance-Leahy Scale: Fair Sitting balance - Comments: poor posture   Standing balance support: Bilateral upper extremity supported Standing balance-Leahy Scale: Poor Standing balance comment: requires mod A to maintain                             Pertinent Vitals/Pain Pain Assessment: No/denies pain    Home Living Family/patient expects to be discharged to:: Private residence Living Arrangements: Children Available Help at Discharge: Family;Personal care attendant (pt is followed by [facility])             Additional Comments: unsure of home layout, as pt is a poor historian    Prior Function Level of Independence: Needs assistance   Gait / Transfers Assistance Needed:  (pt is a poor historian and unable to provide this info)  ADL's / Homemaking Assistance Needed: full ADL support from caregivers        Hand Dominance  Extremity/Trunk Assessment   Upper Extremity Assessment: Generalized weakness (grossly 3/5)           Lower Extremity Assessment: Generalized weakness (grossly 3+/5)      Cervical / Trunk Assessment: Kyphotic  Communication   Communication: Expressive  difficulties  Cognition Arousal/Alertness: Awake/alert Behavior During Therapy: WFL for tasks assessed/performed Overall Cognitive Status: History of cognitive impairments - at baseline       Memory: Decreased short-term memory              General Comments General comments (skin integrity, edema, etc.): extra time to respond or perform tasks    Exercises Other Exercises Other Exercises: Pt sat at EOB x6 minutes with B UE and LE support with min A to min guard to maintain. Cues for postural correction and anterior weight shifting to improve trunk stability and balance.      Assessment/Plan    PT Assessment Patient needs continued PT services  PT Diagnosis Difficulty walking;Generalized weakness   PT Problem List Decreased strength;Decreased activity tolerance;Decreased balance;Decreased mobility;Decreased knowledge of use of DME  PT Treatment Interventions DME instruction;Gait training;Therapeutic activities;Therapeutic exercise;Balance training;Neuromuscular re-education;Patient/family education   PT Goals (Current goals can be found in the Care Plan section) Acute Rehab PT Goals Patient Stated Goal: unable to state PT Goal Formulation: Patient unable to participate in goal setting Time For Goal Achievement: 01/20/16 Potential to Achieve Goals: Fair    Frequency Min 2X/week   Barriers to discharge Inaccessible home environment;Decreased caregiver support;Other (comment) (unsure of home set up at this time) pt will require assistance for all mobility    Co-evaluation               End of Session Equipment Utilized During Treatment: Gait belt Activity Tolerance: Patient tolerated treatment well Patient left: with call bell/phone within reach;with chair alarm set;in chair Nurse Communication: Mobility status         Time: 1115-1145 PT Time Calculation (min) (ACUTE ONLY): 30 min   Charges:   PT Evaluation $PT Eval High Complexity: 1 Procedure PT  Treatments $Therapeutic Activity: 8-22 mins   PT G Codes:        Adelene IdlerMindy Jo Neka Bise, PT, DPT  01/06/2016, 1:19 PM [phone]

## 2016-01-06 NOTE — NC FL2 (Signed)
[place] MEDICAID FL2 LEVEL OF CARE SCREENING TOOL     IDENTIFICATION  Patient Name: Brandy Oconnor Birthdate: 25-May-1931 Sex: female Admission Date (Current Location): 01/04/2016  [place]  [place]  ([id number] [facility]) Facility and Address:  [facility], [address], [place], [place] [address]      Provider Number: [id number]  Attending Physician Name and Address:  Katharina Caperima Vaickute, MD  Relative Name and Phone Number:       Current Level of Care: Hospital Recommended Level of Care: Skilled Nursing Facility Prior Approval Number:    Date Approved/Denied:   PASRR Number:  ( [id number] )  Discharge Plan: SNF    Current Diagnoses: Patient Active Problem List   Diagnosis Date Noted  . Encephalopathy acute 01/04/2016  . Hip fracture (HCC) 05/23/2015    Orientation RESPIRATION BLADDER Height & Weight     Self  Normal Continent Weight: 112 lb 14 oz (51.2 kg) Height:  5\' 3"  (160 cm)  BEHAVIORAL SYMPTOMS/MOOD NEUROLOGICAL BOWEL NUTRITION STATUS   (none )  (none ) Continent Diet (Diet: DYS 1 )  AMBULATORY STATUS COMMUNICATION OF NEEDS Skin   Extensive Assist Verbally Normal                       Personal Care Assistance Level of Assistance  Bathing, Feeding, Dressing Bathing Assistance: Limited assistance Feeding assistance: Limited assistance Dressing Assistance: Limited assistance     Functional Limitations Info  Sight, Hearing, Speech Sight Info: Adequate Hearing Info: Adequate Speech Info: Adequate    SPECIAL CARE FACTORS FREQUENCY  PT (By licensed PT), OT (By licensed OT)     PT Frequency:  (5) OT Frequency:  (5)            Contractures      Additional Factors Info  Code Status, Allergies Code Status Info:  (DNR ) Allergies Info:  (Codeine )           Current Medications (01/06/2016):  This is the current hospital active medication list Current Facility-Administered Medications  Medication Dose  Route Frequency Provider Last Rate Last Dose  . acetaminophen (TYLENOL) tablet 650 mg  650 mg Oral Q6H PRN Adrian SaranSital Mody, MD       Or  . acetaminophen (TYLENOL) suppository 650 mg  650 mg Rectal Q6H PRN Adrian SaranSital Mody, MD      . atenolol (TENORMIN) tablet 50 mg  50 mg Oral Daily Adrian SaranSital Mody, MD   50 mg at 01/06/16 0959  . carbidopa-levodopa (SINEMET IR) 10-100 MG per tablet immediate release 1 tablet  1 tablet Oral BID Adrian SaranSital Mody, MD   1 tablet at 01/06/16 0959  . cefTRIAXone (ROCEPHIN) 1 g in dextrose 5 % 50 mL IVPB  1 g Intravenous Q24H Sital Mody, MD   1 g at 01/06/16 1000  . dextrose 5 % and 0.45 % NaCl with KCl 20 mEq/L infusion   Intravenous Continuous Katharina Caperima Vaickute, MD 100 mL/hr at 01/05/16 1453    . enoxaparin (LOVENOX) injection 40 mg  40 mg Subcutaneous Q24H Adrian SaranSital Mody, MD   40 mg at 01/05/16 2123  . levothyroxine (SYNTHROID, LEVOTHROID) tablet 100 mcg  100 mcg Oral Daily Adrian SaranSital Mody, MD   100 mcg at 01/05/16 1105  . lisinopril (PRINIVIL,ZESTRIL) tablet 10 mg  10 mg Oral Daily Adrian SaranSital Mody, MD   10 mg at 01/06/16 0959  . nystatin (MYCOSTATIN) 100000 UNIT/ML suspension 500,000 Units  5 mL Oral QID Katharina Caperima Vaickute, MD      .  ondansetron (ZOFRAN) tablet 4 mg  4 mg Oral Q6H PRN Adrian SaranSital Mody, MD       Or  . ondansetron (ZOFRAN) injection 4 mg  4 mg Intravenous Q6H PRN Sital Mody, MD      . pravastatin (PRAVACHOL) tablet 20 mg  20 mg Oral QHS Adrian SaranSital Mody, MD   20 mg at 01/05/16 2123  . primidone (MYSOLINE) tablet 250 mg  250 mg Oral TID Adrian SaranSital Mody, MD   250 mg at 01/06/16 1235  . senna-docusate (Senokot-S) tablet 1 tablet  1 tablet Oral QHS PRN Adrian SaranSital Mody, MD      . sodium chloride flush (NS) 0.9 % injection 3 mL  3 mL Intravenous Q12H Adrian SaranSital Mody, MD   3 mL at 01/06/16 1000     Discharge Medications: Please see discharge summary for a list of discharge medications.  Relevant Imaging Results:  Relevant Lab Results:   Additional Information  (SSN: [id number])  Haig ProphetMorgan, Kamali Nephew G, LCSW

## 2016-01-06 NOTE — Progress Notes (Signed)
Patient is followed by [facility]. Clinical Child psychotherapistocial Worker (CSW) contacted Home Care RN at [facility] to discuss care coordination and left her a voicemail. CSW will continue to follow and assist as needed.   Jetta LoutBailey Morgan, LCSW [phone]

## 2016-01-06 NOTE — Progress Notes (Signed)
Initial Nutrition Assessment  DOCUMENTATION CODES:   Not applicable  INTERVENTION:   Cater to pt preferences on Dysphagia I, nectar thick liquids Continue to provide assistance/encouragement at meal times as CNA reports pt a feeder Will send Mighty Shakes II on meal trays TID (approximately 500kcals and 20g protein and Magic Cup BID for added nutrition (approximately 300kcals and 9g protein)   NUTRITION DIAGNOSIS:   Swallowing difficulty related to dysphagia as evidenced by  (diet order, SLP following).  GOAL:   Patient will meet greater than or equal to 90% of their needs  MONITOR:   PO intake, Supplement acceptance, Labs, Weight trends, I & O's  REASON FOR ASSESSMENT:   Malnutrition Screening Tool    ASSESSMENT:   Pt admitted with AMS secondary to UTI. Pt with h/o dementia and hip fracture.  Past Medical History  Diagnosis Date  . Hyperlipidemia   . Depression   . Osteoarthritis   . Hypothyroidism   . Hypertension   . GERD (gastroesophageal reflux disease)   . Femur fracture, right (HCC)   . Tremor     benign essential tremor- tremors of voice, head and jaw  . Dementia     Diet Order:  DIET - DYS 1 Room service appropriate?: Yes; Fluid consistency:: Nectar Thick   CNA Danielle reports pt ate roughly 50% of breakfast and lunch today and did very well on current diet order.  Pt reported to Clinical research associatewriter, 'oh my appetite's good.' Unable to clarify po intake PTA but pt reports liking Ensure.   Medications: D5 0.45%NS with KCl at 15300mL/hr Labs: Ca 8.0   Gastrointestinal Profile: Last BM:  01/05/2016   Nutrition-Focused Physical Exam Findings:  Unable to complete Nutrition-Focused physical exam at this time.    Weight Change: Pt reports she does not know her weight. Pt just situated in chair at bedside on visit. Per [facility] weight trends, pt weight loss of 5% in one month.   Skin:  Reviewed, no issues   Height:   Ht Readings from Last 1 Encounters:   01/04/16 5\' 3"  (1.6 m)    Weight:   Wt Readings from Last 1 Encounters:  01/04/16 112 lb 14 oz (51.2 kg)   Wt Readings from Last 10 Encounters:  01/04/16 112 lb 14 oz (51.2 kg)  11/15/15 118 lb 1.6 oz (53.57 kg)  05/24/15 143 lb (64.864 kg)  11/09/14 127 lb 3.2 oz (57.698 kg)    BMI:  Body mass index is 20 kg/(m^2).  Estimated Nutritional Needs:   Kcal:  1530-1785kcals  Protein:  61-76g protein  Fluid:  >/= 1.5L fluid  EDUCATION NEEDS:   Education needs no appropriate at this time   Leda QuailAllyson Jafari Mckillop, RD, LDN Pager [phone] Weekend/On-Call Pager [phone]

## 2016-01-06 NOTE — Progress Notes (Signed)
TC to MD r/t "white patches" to patient's tongue. New order for nystatin mouth wash, notified MD if patient cannot tolerate and will change order.

## 2016-01-06 NOTE — Evaluation (Signed)
Clinical/Bedside Swallow Evaluation Patient Details  Name: Brandy Oconnor MRN: [id number] Date of Birth: April 15, 1931  Today's Date: 01/06/2016 Time: SLP Start Time (ACUTE ONLY): 0930 SLP Stop Time (ACUTE ONLY): 1002 SLP Time Calculation (min) (ACUTE ONLY): 32 min  Past Medical History:  Past Medical History  Diagnosis Date  . Hyperlipidemia   . Depression   . Osteoarthritis   . Hypothyroidism   . Hypertension   . GERD (gastroesophageal reflux disease)   . Femur fracture, right (HCC)   . Tremor     benign essential tremor- tremors of voice, head and jaw  . Dementia    Past Surgical History:  Past Surgical History  Procedure Laterality Date  . Abdominal hysterectomy    . Cholecystectomy    . Appendectomy    . Thyroidectomy, partial    . Total hip arthroplasty Right 05/24/2015    Procedure: TOTAL HIP ARTHROPLASTY ANTERIOR APPROACH;  Surgeon: Kennedy BuckerMichael Menz, MD;  Location: [facility] ORS;  Service: Orthopedics;  Laterality: Right;   HPI:      Assessment / Plan / Recommendation Clinical Impression  pt presents with a moderate oral pharyngeal dysphagia characterized by pocketing and increased a-p transit time with ms, regular and thin liquids. pt had no overb ssx aspiration noted with puree or nectar thick liquids. pt had no dentition however stated she does have teeth, not present. pt has weak vocal quality and requires additional time for responsiveness. Nursing educated that pt is verbal with lengthy processing and cues for response as previously thought she was non verbal.     Aspiration Risk  Moderate aspiration risk    Diet Recommendation Dysphagia 1 (Puree);Nectar-thick liquid   Liquid Administration via: Spoon;Cup Medication Administration: Crushed with puree Supervision: Full supervision/cueing for compensatory strategies Compensations: Minimize environmental distractions;Slow rate;Small sips/bites;Follow solids with liquid Postural Changes: Seated upright at 90 degrees     Other  Recommendations Oral Care Recommendations: Oral care BID Other Recommendations: Remove water pitcher   Follow up Recommendations       Frequency and Duration min 3x week  1 week       Prognosis Prognosis for Safe Diet Advancement: Fair Barriers to Reach Goals: Cognitive deficits      Swallow Study   General Date of Onset: 01/05/16 Type of Study: Bedside Swallow Evaluation Diet Prior to this Study: Dysphagia 1 (puree);Nectar-thick liquids Temperature Spikes Noted: No Respiratory Status: Room air History of Recent Intubation: No Behavior/Cognition: Alert;Cooperative;Pleasant mood;Confused Oral Cavity Assessment: Within Functional Limits Oral Care Completed by SLP: No Oral Cavity - Dentition: Dentures, not available Vision: Functional for self-feeding Self-Feeding Abilities: Total assist Patient Positioning: Upright in bed Baseline Vocal Quality: Breathy Volitional Cough: Weak    Oral/Motor/Sensory Function Overall Oral Motor/Sensory Function: Within functional limits   Ice Chips Ice chips: Not tested   Thin Liquid Thin Liquid: Impaired Presentation: Cup Oral Phase Impairments: Reduced labial seal;Poor awareness of bolus Oral Phase Functional Implications: Prolonged oral transit Pharyngeal  Phase Impairments: Suspected delayed Swallow;Wet Vocal Quality;Cough - Immediate;Throat Clearing - Immediate    Nectar Thick Nectar Thick Liquid: Within functional limits Presentation: Spoon;Cup   Honey Thick Honey Thick Liquid: Not tested   Puree Puree: Within functional limits Presentation: Spoon   Solid   GO   Solid: Impaired Oral Phase Impairments: Reduced labial seal;Impaired mastication;Poor awareness of bolus Oral Phase Functional Implications: Impaired mastication;Oral residue;Prolonged oral transit Pharyngeal Phase Impairments: Cough - Immediate;Throat Clearing - Immediate;Cough - Delayed;Suspected delayed Swallow        [name]  01/06/2016,12:17  PM

## 2016-01-06 NOTE — Consult Note (Signed)
Reason for Consult: Parkinson's Referring Physician: Winona LegatoVaickute  CC: Mental and physical decline  HPI: Brandy Oconnor is an 82 y.o. female with history of dementia and BET who was admitted due to acute decline.  Found to have a UTI.  Daughter though reported that patient had been physically and mentally declining.  Daughter not available at this time and patient unable to provide useful information therefore all information obtained form the chart.  From review of chart patient has had gait issues for greater than 2 years.  Had a fall in 2015 that resulted in a hip fracture.  Currently a [facility] client.  Has been evaluated by neurology in the past and diagnosed with a BET and started on Primidone.  On Primidone and now on low doses of Sinemet.    Past Medical History  Diagnosis Date  . Hyperlipidemia   . Depression   . Osteoarthritis   . Hypothyroidism   . Hypertension   . GERD (gastroesophageal reflux disease)   . Femur fracture, right (HCC)   . Tremor     benign essential tremor- tremors of voice, head and jaw  . Dementia     Past Surgical History  Procedure Laterality Date  . Abdominal hysterectomy    . Cholecystectomy    . Appendectomy    . Thyroidectomy, partial    . Total hip arthroplasty Right 05/24/2015    Procedure: TOTAL HIP ARTHROPLASTY ANTERIOR APPROACH;  Surgeon: Kennedy BuckerMichael Menz, MD;  Location: [facility] ORS;  Service: Orthopedics;  Laterality: Right;    Family History  Problem Relation Age of Onset  . Diabetes Mellitus II Mother   . Other Father     Tremors    Social History:  reports that she has never smoked. She does not have any smokeless tobacco history on file. She reports that she does not drink alcohol or use illicit drugs.  Allergies  Allergen Reactions  . Codeine Rash    Medications:  I have reviewed the patient's current medications. Prior to Admission:  Prescriptions prior to admission  Medication Sig Dispense Refill Last Dose  . alendronate (FOSAMAX) 70 MG  tablet Take 70 mg by mouth once a week. On Monday   unknown  . atenolol (TENORMIN) 50 MG tablet Take 50 mg by mouth daily.   unknown  . carbidopa-levodopa (SINEMET IR) 10-100 MG tablet Take 1 tablet by mouth 2 (two) times daily.   unknown  . enoxaparin (LOVENOX) 40 MG/0.4ML injection Inject 0.4 mLs (40 mg total) into the skin daily. 14 Syringe 0   . ketotifen (ZADITOR) 0.025 % ophthalmic solution Place 1 drop into both eyes daily.   unknown  . levothyroxine (SYNTHROID, LEVOTHROID) 100 MCG tablet Take 100 mcg by mouth daily.   unknown  . lisinopril (PRINIVIL,ZESTRIL) 10 MG tablet Take 10 mg by mouth daily.   unknown  . liver oil-zinc oxide (DESITIN) 40 % ointment Apply 1 application topically as needed for irritation (after toileting).   PRN  . loperamide (IMODIUM A-D) 2 MG tablet Take 2 mg by mouth 4 (four) times daily as needed for diarrhea or loose stools.   PRN  . loratadine (CLARITIN) 10 MG tablet Take 10 mg by mouth daily.   unknown  . oxyCODONE (OXY IR/ROXICODONE) 5 MG immediate release tablet Take 1 tablet (5 mg total) by mouth once as needed (for pain score of 1-4). 30 tablet 0   . pravastatin (PRAVACHOL) 20 MG tablet Take 20 mg by mouth at bedtime.   unknown  .  primidone (MYSOLINE) 250 MG tablet Take 250 mg by mouth 3 (three) times daily. At 8am, 1pm, and bedtime   unknown  . traMADol (ULTRAM) 50 MG tablet Take 1 tablet (50 mg total) by mouth every 6 (six) hours as needed for moderate pain or severe pain. 30 tablet 1   . trospium (SANCTURA) 20 MG tablet Take 20 mg by mouth daily.   unknown  . Vitamin D, Ergocalciferol, (DRISDOL) 50000 UNITS CAPS capsule Take 50,000 Units by mouth every 30 (thirty) days. Once a month on the first Monday   unknown   Scheduled: . atenolol  50 mg Oral Daily  . carbidopa-levodopa  1 tablet Oral BID  . cefTRIAXone (ROCEPHIN)  IV  1 g Intravenous Q24H  . enoxaparin (LOVENOX) injection  40 mg Subcutaneous Q24H  . levothyroxine  100 mcg Oral Daily  .  lisinopril  10 mg Oral Daily  . nystatin  5 mL Oral QID  . pravastatin  20 mg Oral QHS  . primidone  250 mg Oral TID  . sodium chloride flush  3 mL Intravenous Q12H    ROS: History obtained from chart review  General ROS: negative for - chills, fatigue, fever, night sweats, weight gain or weight loss Psychological ROS: confusion Ophthalmic ROS: negative for - blurry vision, double vision, eye pain or loss of vision ENT ROS: negative for - epistaxis, nasal discharge, oral lesions, sore throat, tinnitus or vertigo Allergy and Immunology ROS: negative for - hives or itchy/watery eyes Hematological and Lymphatic ROS: negative for - bleeding problems, bruising or swollen lymph nodes Endocrine ROS: negative for - galactorrhea, hair pattern changes, polydipsia/polyuria or temperature intolerance Respiratory ROS: negative for - cough, hemoptysis, shortness of breath or wheezing Cardiovascular ROS: negative for - chest pain, dyspnea on exertion, edema or irregular heartbeat Gastrointestinal ROS: negative for - abdominal pain, diarrhea, hematemesis, nausea/vomiting or stool incontinence Genito-Urinary ROS: negative for - dysuria, hematuria, incontinence or urinary frequency/urgency Musculoskeletal ROS: negative for - joint swelling or muscular weakness Neurological ROS: as noted in HPI Dermatological ROS: negative for rash and skin lesion changes  Physical Examination: Blood pressure 138/67, pulse 71, temperature 97.3 F (36.3 C), temperature source Oral, resp. rate 16, height 5\' 3"  (1.6 m), weight 51.2 kg (112 lb 14 oz), SpO2 98 %.  HEENT-  Normocephalic, no lesions, without obvious abnormality.  Normal external eye and conjunctiva.  Normal TM's bilaterally.  Normal auditory canals and external ears. Normal external nose, mucus membranes and septum.  Normal pharynx.  Head tremor. Cardiovascular- S1, S2 normal, pulses palpable throughout   Lungs- chest clear, no wheezing, rales, normal symmetric  air entry Abdomen- soft, non-tender; bowel sounds normal; no masses,  no organomegaly Extremities- mild BLE edema Lymph-no adenopathy palpable Musculoskeletal-no joint tenderness, deformity or swelling Skin-heel breakdown  Neurological Examination Mental Status: Alert, oriented to name but not to place and time.  Speech fluent but minimal.  Able to follow simple commands but unable to perform 3-step commands. Cranial Nerves: II: Visual fields grossly normal but does not allow discs or pupils to be evaluated III,IV, VI: ptosis not present, extra-ocular motions intact bilaterally V,VII: smile symmetric, facial light touch sensation normal bilaterally VIII: hearing normal bilaterally IX,X: gag reflex present XI: bilateral shoulder shrug XII: midline tongue extension Motor: Lifts upper extremities against gravity and although she can move the lower extremities does not lift them off the bed.  Normal tone.  No tremor noted of UE's.   Sensory: Pinprick and light touch intact throughout,  bilaterally Deep Tendon Reflexes: 2+ in the upper extremities and absent in the lower extremities Plantars: Right: downgoing   Left: upgoing Cerebellar: Finger to nose intact bilaterally Gait: not tested due to safety concerns   Laboratory Studies:   Basic Metabolic Panel:  Recent Labs Lab 01/04/16 1205 01/05/16 0355  NA 136 142  K 4.3 3.7  CL 103 111  CO2 24 23  GLUCOSE 108* 87  BUN 14 10  CREATININE 0.70 0.65  CALCIUM 8.7* 8.0*    Liver Function Tests:  Recent Labs Lab 01/04/16 1205  AST 19  ALT 5*  ALKPHOS 131*  BILITOT <0.1*  PROT 6.1*  ALBUMIN 3.4*   No results for input(s): LIPASE, AMYLASE in the last 168 hours. No results for input(s): AMMONIA in the last 168 hours.  CBC:  Recent Labs Lab 01/04/16 1205 01/05/16 0355  WBC 13.2* 7.6  NEUTROABS 10.5*  --   HGB 14.9 13.4  HCT 43.0 39.7  MCV 96.5 98.1  PLT 302 242    Cardiac Enzymes:  Recent Labs Lab  01/04/16 1205  TROPONINI <0.03    BNP: Invalid input(s): POCBNP  CBG: No results for input(s): GLUCAP in the last 168 hours.  Microbiology: Results for orders placed or performed during the hospital encounter of 01/04/16  Urine culture     Status: Abnormal   Collection Time: 01/04/16 12:05 PM  Result Value Ref Range Status   Specimen Description URINE, CATHETERIZED  Final   Special Requests Normal  Final   Culture MULTIPLE SPECIES PRESENT, SUGGEST RECOLLECTION (A)  Final   Report Status 01/05/2016 FINAL  Final    Coagulation Studies: No results for input(s): LABPROT, INR in the last 72 hours.  Urinalysis:   Recent Labs Lab 01/04/16 1205  COLORURINE AMBER*  LABSPEC 1.013  PHURINE 6.0  GLUCOSEU NEGATIVE  HGBUR 2+*  BILIRUBINUR NEGATIVE  KETONESUR TRACE*  PROTEINUR 100*  NITRITE NEGATIVE  LEUKOCYTESUR 3+*    Lipid Panel:  No results found for: CHOL, TRIG, HDL, CHOLHDL, VLDL, LDLCALC  HgbA1C: No results found for: HGBA1C  Urine Drug Screen:  No results found for: LABOPIA, COCAINSCRNUR, LABBENZ, AMPHETMU, THCU, LABBARB  Alcohol Level: No results for input(s): ETH in the last 168 hours.  Other results: EKG: sinus rhythm at 72 bpm.  Imaging: No results found.   Assessment/Plan: 82 year old female with cognitive and functional limitations.  Has had a head and upper extremity tremor for many years and diagnosed with a BET which I agree with.  Patient with normal tone on neurological examination and minimal upper extremity tremor despite likely not having any Sinemet in her system at the time of evaluation.  Suspect decline secondary to dementia and small vessel disease.  Head CT reviewed and shows atrophy and small vessel ischemic changes.  Patient will likely benefit from antiplatelet therapy with vascular risk factors.  Recommendations: 1.  ASA  daily.   2.  No further neurologic intervention is recommended at this time.  If further questions arise, please  call or page at that time.  Thank you for allowing neurology to participate in the care of this patient.  Case discussed with Dr. Loreli Slot, MD Neurology [phone] 01/06/2016, 3:54 PM

## 2016-01-06 NOTE — Progress Notes (Signed)
[facility] did not extend a bed offer. [facility] did make a bed offer. Clinical Social Worker (CSW) contacted patient's daughter Boyd Kerbsenny and made her aware of above. Daughter is agreeable for patient to go to [facility]. CSW left [facility] social worker Carollee HerterShannon a voicemail making her aware of above. CSW will continue to follow and assist as needed.   Jetta LoutBailey Morgan, LCSW [phone]) [phone]

## 2016-01-07 LAB — URINE CULTURE
Culture: NO GROWTH
Special Requests: NORMAL

## 2016-01-07 MED ORDER — OXYCODONE HCL 5 MG PO TABS: 5.0000 mg | ORAL_TABLET | Freq: Four times a day (QID) | ORAL | Status: DC | PRN

## 2016-01-07 MED ORDER — CIPROFLOXACIN HCL 500 MG PO TABS
250.0000 mg | ORAL_TABLET | Freq: Two times a day (BID) | ORAL | Status: DC
  Administered 2016-01-07: 250 mg via ORAL
  Filled 2016-01-07: qty 1

## 2016-01-07 MED ORDER — CIPROFLOXACIN HCL 250 MG PO TABS: 250.0000 mg | ORAL_TABLET | Freq: Two times a day (BID) | ORAL | Status: DC

## 2016-01-07 NOTE — Care Management Note (Signed)
Case Management Note  Patient Details  Name: Brandy Oconnor MRN: [id number] Date of Birth: 1931-04-29  Subjective/Objective:                    Action/Plan: [facility] will discharge to [facility] today.  No CM needs .     Expected Discharge Date:                  Expected Discharge Plan:  Skilled Nursing Facility  In-House Referral:     Discharge planning Services     Post Acute Care Choice:    Choice offered to:     DME Arranged:    DME Agency:     HH Arranged:    HH Agency:     Status of Service:  Completed, signed off  If discussed at [employer] Length of Stay Meetings, dates discussed:    Additional Comments:  Adonis HugueninBerkhead, Lael Pilch L, RN 01/07/2016, 1:29 PM

## 2016-01-07 NOTE — Discharge Summary (Addendum)
Physician Discharge Summary  Patient ID: Brandy Oconnor MRN: [id number] DOB/AGE: 80-14-1932 80 y.o.  Admit date: 01/04/2016 Discharge date: 01/07/2016  Admission Diagnoses:  Discharge Diagnoses:  Active Problems:   Encephalopathy acute   UTI  Discharged Condition: stable  Hospital Course:    #1 acute encephalopathy, Suspected due to urinary tract infection, After treatment of UTI she returned to baseline mental status. #2 UTI: Cultures remain negative. Got 3 days rocephin which may be adequate but will send out on 3 more days of PO cipro. #3. Leukocytosis, resolved with antibiotic therapy #4. Hyperglycemia, resolved #5. Dementia, patient was seen by neurologist, supportive therapy was recommended, including antiplatelet therapy for small vessel disease, continue Pravachol. No further neuro work up. #6. Hypothyroidism, continue Synthroid, TSH was checked and was normal #7. Essential hypertension,Continue current meds #8. Generalized weakness, physical therapist recommended skilled nursing facility placement for rehabilitation,family agreeable for [facility]. Consults: neurology  Significant Diagnostic Studies: microbiology: urine culture: negative  Treatments: antibiotics: ceftriaxone  Discharge Exam: Blood pressure 130/61, pulse 80, temperature 98.4 F (36.9 C), temperature source Oral, resp. rate 16, height 5\' 3"  (1.6 m), weight 79.561 kg (175 lb 6.4 oz), SpO2 97 %. General appearance: alert Resp: clear to auscultation bilaterally Cardio: regular rate and rhythm, S1, S2 normal, no murmur, click, rub or gallop  Disposition: 01-Home or Self Care      Discharge Instructions    DIET - DYS 1    Complete by:  As directed   Fluid consistency:  Nectar Thick     Increase activity slowly    Complete by:  As directed             Medication List    STOP taking these medications        enoxaparin 40 MG/0.4ML injection  Commonly known as:  LOVENOX     traMADol 50 MG tablet   Commonly known as:  ULTRAM      TAKE these medications        alendronate 70 MG tablet  Commonly known as:  FOSAMAX  Take 70 mg by mouth once a week. On Monday     atenolol 50 MG tablet  Commonly known as:  TENORMIN  Take 50 mg by mouth daily.     carbidopa-levodopa 10-100 MG tablet  Commonly known as:  SINEMET IR  Take 1 tablet by mouth 2 (two) times daily.     ciprofloxacin 250 MG tablet  Commonly known as:  CIPRO  Take 1 tablet (250 mg total) by mouth 2 (two) times daily.     ketotifen 0.025 % ophthalmic solution  Commonly known as:  ZADITOR  Place 1 drop into both eyes daily.     levothyroxine 100 MCG tablet  Commonly known as:  SYNTHROID, LEVOTHROID  Take 100 mcg by mouth daily.     lisinopril 10 MG tablet  Commonly known as:  PRINIVIL,ZESTRIL  Take 10 mg by mouth daily.     liver oil-zinc oxide 40 % ointment  Commonly known as:  DESITIN  Apply 1 application topically as needed for irritation (after toileting).     loperamide 2 MG tablet  Commonly known as:  IMODIUM A-D  Take 2 mg by mouth 4 (four) times daily as needed for diarrhea or loose stools.     loratadine 10 MG tablet  Commonly known as:  CLARITIN  Take 10 mg by mouth daily.     oxyCODONE 5 MG immediate release tablet  Commonly known as:  Oxy IR/ROXICODONE  Take 1 tablet (5 mg total) by mouth every 6 (six) hours as needed for severe pain (for pain score of 1-4).     pravastatin 20 MG tablet  Commonly known as:  PRAVACHOL  Take 20 mg by mouth at bedtime.     primidone 250 MG tablet  Commonly known as:  MYSOLINE  Take 250 mg by mouth 3 (three) times daily. At 8am, 1pm, and bedtime     trospium 20 MG tablet  Commonly known as:  SANCTURA  Take 20 mg by mouth daily.     Vitamin D (Ergocalciferol) 50000 units Caps capsule  Commonly known as:  DRISDOL  Take 50,000 Units by mouth every 30 (thirty) days. Once a month on the first Monday       Follow-up Information    Follow up with [facility] .   Specialty:  Skilled Nursing Facility   Contact information:   [address] [place] [id number] [phone]    a  Signed: [facility], VIPUL 01/07/2016, 2:15 PM

## 2016-01-07 NOTE — Care Management Important Message (Signed)
Important Message  Patient Details  Name: Brandy Oconnor MRN: [id number] Date of Birth: 1931/04/28   Medicare Important Message Given:  Yes    Olegario MessierKathy A Jakobie Henslee 01/07/2016, 11:23 AM

## 2016-01-07 NOTE — Clinical Social Work Placement (Signed)
   CLINICAL SOCIAL WORK PLACEMENT  NOTE  Date:  01/07/2016  Patient Details  Name: Brandy DressSarah Oconnor MRN: [id number] Date of Birth: 18-Aug-1930  Clinical Social Work is seeking post-discharge placement for this patient at the Skilled  Nursing Facility level of care (*CSW will initial, date and re-position this form in  chart as items are completed):  Yes   Patient/family provided with [facility] Clinical Social Work Department's list of facilities offering this level of care within the geographic area requested by the patient (or if unable, by the patient's family).  Yes   Patient/family informed of their freedom to choose among providers that offer the needed level of care, that participate in Medicare, Medicaid or managed care program needed by the patient, have an available bed and are willing to accept the patient.  Yes   Patient/family informed of [facility]'s ownership interest in [facility] and [facility], as well as of the fact that they are under no obligation to receive care at these facilities.  PASRR submitted to EDS on       PASRR number received on       Existing PASRR number confirmed on 01/06/16     FL2 transmitted to all facilities in geographic area requested by pt/family on 01/06/16     FL2 transmitted to all facilities within larger geographic area on       Patient informed that his/her managed care company has contracts with or will negotiate with certain facilities, including the following:        Yes   Patient/family informed of bed offers received.  Patient chooses bed at  ([facility] )     Physician recommends and patient chooses bed at      Patient to be transferred to  ([facility] ) on 01/07/16.  [facility] facility [facility]     Patient family notified on 01/07/16 of transfer.  Name of family member notified:   (CSW left patient's daughter Boyd Kerbsenny a Engineer, technical salesvoicemail. )     PHYSICIAN       Additional Comment:     _______________________________________________ Haig ProphetMorgan, [place] G, LCSW 01/07/2016, 2:41 PM

## 2016-01-07 NOTE — NC FL2 (Signed)
[place] MEDICAID FL2 LEVEL OF CARE SCREENING TOOL     IDENTIFICATION  Patient Name: Brandy DressSarah Ostermiller Birthdate: 1931-04-17 Sex: female Admission Date (Current Location): 01/04/2016  [place]  [place]  ([id number] [facility] Facility and Address:  [facility], [address], [place], [place] [address]      Provider Number: [id number]  Attending Physician Name and Address:  Gracelyn NurseJohn D Johnston, MD  Relative Name and Phone Number:       Current Level of Care: Hospital Recommended Level of Care: Skilled Nursing Facility Prior Approval Number:    Date Approved/Denied:   PASRR Number:  ( [id number] )  Discharge Plan: SNF    Current Diagnoses: Patient Active Problem List   Diagnosis Date Noted  . Encephalopathy acute 01/04/2016  . Hip fracture (HCC) 05/23/2015    Orientation RESPIRATION BLADDER Height & Weight     Self  Normal Continent Weight: 175 lb 6.4 oz (79.561 kg) Height:  5\' 3"  (160 cm)  BEHAVIORAL SYMPTOMS/MOOD NEUROLOGICAL BOWEL NUTRITION STATUS   (none )  (none ) Continent Diet (Diet: DYS 1 )  AMBULATORY STATUS COMMUNICATION OF NEEDS Skin   Extensive Assist Verbally Normal                       Personal Care Assistance Level of Assistance  Bathing, Feeding, Dressing Bathing Assistance: Limited assistance Feeding assistance: Limited assistance Dressing Assistance: Limited assistance     Functional Limitations Info  Sight, Hearing, Speech Sight Info: Adequate Hearing Info: Adequate Speech Info: Adequate    SPECIAL CARE FACTORS FREQUENCY  PT (By licensed PT), OT (By licensed OT)     PT Frequency:  (5) OT Frequency:  (5)            Contractures      Additional Factors Info  Code Status, Allergies Code Status Info:  (DNR ) Allergies Info:  (Codeine )           Current Medications (01/07/2016):  This is the current hospital active medication list Current Facility-Administered Medications  Medication  Dose Route Frequency Provider Last Rate Last Dose  . acetaminophen (TYLENOL) tablet 650 mg  650 mg Oral Q6H PRN Adrian SaranSital Mody, MD       Or  . acetaminophen (TYLENOL) suppository 650 mg  650 mg Rectal Q6H PRN Adrian SaranSital Mody, MD      . atenolol (TENORMIN) tablet 50 mg  50 mg Oral Daily Adrian SaranSital Mody, MD   50 mg at 01/07/16 0849  . carbidopa-levodopa (SINEMET IR) 10-100 MG per tablet immediate release 1 tablet  1 tablet Oral BID Adrian SaranSital Mody, MD   1 tablet at 01/07/16 0848  . cefTRIAXone (ROCEPHIN) 1 g in dextrose 5 % 50 mL IVPB  1 g Intravenous Q24H Adrian SaranSital Mody, MD   1 g at 01/07/16 1115  . ciprofloxacin (CIPRO) tablet 250 mg  250 mg Oral BID Gracelyn NurseJohn D Johnston, MD      . dextrose 5 % and 0.45 % NaCl with KCl 20 mEq/L infusion   Intravenous Continuous Katharina Caperima Vaickute, MD 100 mL/hr at 01/07/16 0657    . enoxaparin (LOVENOX) injection 40 mg  40 mg Subcutaneous Q24H Adrian SaranSital Mody, MD   40 mg at 01/06/16 2126  . levothyroxine (SYNTHROID, LEVOTHROID) tablet 100 mcg  100 mcg Oral Daily Adrian SaranSital Mody, MD   100 mcg at 01/07/16 0651  . lisinopril (PRINIVIL,ZESTRIL) tablet 10 mg  10 mg Oral Daily Adrian SaranSital Mody, MD   10 mg at  01/07/16 0849  . nystatin (MYCOSTATIN) 100000 UNIT/ML suspension 500,000 Units  5 mL Oral QID Katharina Caperima Vaickute, MD   500,000 Units at 01/07/16 0849  . ondansetron (ZOFRAN) tablet 4 mg  4 mg Oral Q6H PRN Adrian SaranSital Mody, MD       Or  . ondansetron (ZOFRAN) injection 4 mg  4 mg Intravenous Q6H PRN Sital Mody, MD      . pravastatin (PRAVACHOL) tablet 20 mg  20 mg Oral QHS Adrian SaranSital Mody, MD   20 mg at 01/06/16 2125  . primidone (MYSOLINE) tablet 250 mg  250 mg Oral TID Adrian SaranSital Mody, MD   250 mg at 01/07/16 0848  . senna-docusate (Senokot-S) tablet 1 tablet  1 tablet Oral QHS PRN Adrian SaranSital Mody, MD      . sodium chloride flush (NS) 0.9 % injection 3 mL  3 mL Intravenous Q12H Adrian SaranSital Mody, MD   3 mL at 01/06/16 1000     Discharge Medications: Please see discharge summary for a list of discharge medications.  Relevant Imaging  Results:  Relevant Lab Results:   Additional Information  (SSN: [id number])  Haig ProphetMorgan, Sharon Rubis G, LCSW

## 2016-01-07 NOTE — Discharge Instructions (Signed)

## 2016-01-07 NOTE — Progress Notes (Addendum)
Patient is medically stable for D/C to [facility] today. Per Jomarie LongsJoseph [facility] liaison patient will go to room 204. RN will call report to RN Joni Reiningicole at [phone] and arrange EMS for transport. Clinical Child psychotherapistocial Worker (CSW) sent D/C Summary, FL2 and D/C Packet to [employer] via [employer]. CSW contacted [employer] [facility] social worker and made her aware of above. CSW left patient's daughter Boyd Kerbsenny a Engineer, technical salesvoicemail. Please reconsult if future social work needs arise. CSW signing off.   Patient's daughter Boyd Kerbsenny called CSW back. CSW made daughter aware of above. Daughter is in agreement with plan.   Jetta LoutBailey Morgan, LCSW [phone]

## 2016-01-07 NOTE — Progress Notes (Signed)
Crystal [phone]) [phone] from [facility] called to [phone] about D/C. Per Crystal [facility] is in agreement with plan for patient to D/C to [facility] and will not be able to transport patient after 1 pm. Clinical Social Worker (CSW) made [employer]Crystal aware that a D/C order is not in yet. CSW will continue to follow and assist as needed.   Jetta LoutBailey Morgan, LCSW [phone]

## 2016-01-07 NOTE — Progress Notes (Signed)
Report called to dee-dee at [facility]. Pt left via ems. vss

## 2016-01-19 ENCOUNTER — Emergency Department
Admission: EM | Admit: 2016-01-19 | Discharge: 2016-01-19 | Disposition: A | Discharge status: Home / Self Care | Payer: Medicare (Managed Care) | Attending: Emergency Medicine | Admitting: Emergency Medicine

## 2016-01-19 ENCOUNTER — Emergency Department: Payer: Medicare (Managed Care)

## 2016-01-19 DIAGNOSIS — Z792 Long term (current) use of antibiotics: Secondary | ICD-10-CM | POA: Diagnosis not present

## 2016-01-19 DIAGNOSIS — Y939 Activity, unspecified: Secondary | ICD-10-CM | POA: Insufficient documentation

## 2016-01-19 DIAGNOSIS — M1990 Unspecified osteoarthritis, unspecified site: Secondary | ICD-10-CM | POA: Insufficient documentation

## 2016-01-19 DIAGNOSIS — W19XXXA Unspecified fall, initial encounter: Secondary | ICD-10-CM | POA: Diagnosis not present

## 2016-01-19 DIAGNOSIS — Y999 Unspecified external cause status: Secondary | ICD-10-CM | POA: Diagnosis not present

## 2016-01-19 DIAGNOSIS — I10 Essential (primary) hypertension: Secondary | ICD-10-CM | POA: Insufficient documentation

## 2016-01-19 DIAGNOSIS — S0990XA Unspecified injury of head, initial encounter: Secondary | ICD-10-CM

## 2016-01-19 DIAGNOSIS — Y9289 Other specified places as the place of occurrence of the external cause: Secondary | ICD-10-CM | POA: Insufficient documentation

## 2016-01-19 DIAGNOSIS — F329 Major depressive disorder, single episode, unspecified: Secondary | ICD-10-CM | POA: Insufficient documentation

## 2016-01-19 DIAGNOSIS — Z79899 Other long term (current) drug therapy: Secondary | ICD-10-CM | POA: Insufficient documentation

## 2016-01-19 DIAGNOSIS — S0101XA Laceration without foreign body of scalp, initial encounter: Secondary | ICD-10-CM | POA: Diagnosis not present

## 2016-01-19 DIAGNOSIS — E039 Hypothyroidism, unspecified: Secondary | ICD-10-CM | POA: Insufficient documentation

## 2016-01-19 DIAGNOSIS — Z8719 Personal history of other diseases of the digestive system: Secondary | ICD-10-CM | POA: Insufficient documentation

## 2016-01-19 DIAGNOSIS — E785 Hyperlipidemia, unspecified: Secondary | ICD-10-CM | POA: Insufficient documentation

## 2016-01-19 MED ORDER — LIDOCAINE-EPINEPHRINE (PF) 1 %-1:200000 IJ SOLN
INTRAMUSCULAR | Status: AC
  Administered 2016-01-19: 23:00:00
  Filled 2016-01-19: qty 30

## 2016-01-19 NOTE — ED Provider Notes (Signed)
[facility] Emergency Department Provider Note        Time seen: ----------------------------------------- 8:06 PM on 01/19/2016 -----------------------------------------  Level V caveat: Review of systems and history is limited by dementia  I have reviewed the triage vital signs and the nursing notes.   HISTORY  Chief Complaint Head Laceration    HPI Brandy Oconnor is a 82 y.o. female who presents to ER if she had an unwitnessed fall from [facility]. She presents with a laceration to the back of her head with controlled bleeding. Patient denies any complaints other than posterior scalp pain.   Past Medical History  Diagnosis Date  . Hyperlipidemia   . Depression   . Osteoarthritis   . Hypothyroidism   . Hypertension   . GERD (gastroesophageal reflux disease)   . Femur fracture, right (HCC)   . Tremor     benign essential tremor- tremors of voice, head and jaw  . Dementia     Patient Active Problem List   Diagnosis Date Noted  . Encephalopathy acute 01/04/2016  . Hip fracture (HCC) 05/23/2015    Past Surgical History  Procedure Laterality Date  . Abdominal hysterectomy    . Cholecystectomy    . Appendectomy    . Thyroidectomy, partial    . Total hip arthroplasty Right 05/24/2015    Procedure: TOTAL HIP ARTHROPLASTY ANTERIOR APPROACH;  Surgeon: Kennedy BuckerMichael Menz, MD;  Location: [facility] ORS;  Service: Orthopedics;  Laterality: Right;    Allergies Codeine  Social History Social History  Substance Use Topics  . Smoking status: Never Smoker   . Smokeless tobacco: None  . Alcohol Use: No    Review of Systems Unknown, positive for scalp pain  ____________________________________________   PHYSICAL EXAM:  VITAL SIGNS: ED Triage Vitals  Enc Vitals Group     BP 01/19/16 1919 184/92 mmHg     Pulse Rate 01/19/16 1919 85     Resp 01/19/16 1919 16     Temp 01/19/16 1919 97.7 F (36.5 C)     Temp src --      SpO2 01/19/16 1919 100 %      Weight --      Height --      Head Cir --      [facility] --      Pain Score --      Pain Loc --      Pain Edu? --      Excl. in GC? --     Constitutional: No acute distress Eyes: Conjunctivae are normal. PERRL. Normal extraocular movements. ENT   Head: Briskly bleeding midline posterior parietal scalp wound   Nose: No congestion/rhinnorhea.   Mouth/Throat: Mucous membranes are moist.   Neck: No stridor. Cardiovascular: Normal rate, regular rhythm. No murmurs, rubs, or gallops. Respiratory: Normal respiratory effort without tachypnea nor retractions. Breath sounds are clear and equal bilaterally. No wheezes/rales/rhonchi. Gastrointestinal: Soft and nontender. Normal bowel sounds Musculoskeletal: Unremarkable range of motion of her extremities Neurologic:  No gross focal neurologic deficits are appreciated.  Skin:  Pulsatile bleeding coming from the posterior scalp wound, appears to be a puncture Psychiatric: Mood and affect are normal.  ____________________________________________  ED COURSE:  Pertinent labs & imaging results that were available during my care of the patient were reviewed by me and considered in my medical decision making (see chart for details). Patient is in no acute distress, had an unwitnessed fall. We will obtain CT imaging and she will require wound closure.  LACERATION REPAIR Performed by: Emily Filbert Authorized by: Daryel November E Consent: Verbal consent obtained. Risks and benefits: risks, benefits and alternatives were discussed Consent given by: patient Patient identity confirmed: provided demographic data Prepped and Draped in normal sterile fashion Wound explored, brisk pulsatile blood flow from the wound  Laceration Location: Posterior parietal scalp  Laceration Length: 1 cm  No Foreign Bodies seen or palpated  Anesthesia: local infiltration  Local anesthetic: lidocaine 1 % with epinephrine  Anesthetic  total: 20 ml  Irrigation method: syringe Amount of cleaning: standard  Skin closure: 3-0 Prolene   Number of sutures: 1   Technique: Figure-of-eight   Patient tolerance: Patient tolerated the procedure well with no immediate complications. ____________________________________________    RADIOLOGY Images were viewed by me  CT head IMPRESSION: Small hematoma with soft tissue swelling/ laceration overlying the right parietal bone. No evidence of calvarial fracture.  No evidence of acute intracranial abnormality. Mild small vessel ischemic changes. ____________________________________________  FINAL ASSESSMENT AND PLAN  Fall, minor head injury, scalp laceration with arterial bleeding  Plan: Patient with imaging as dictated above. Patient had good hemostatic result after figure-of-eight stitch around the bleeding area. Patient will need follow-up in 7-10 days for suture removal.   Emily Filbert, MD   Note: This dictation was prepared with Dragon dictation. Any transcriptional errors that result from this process are unintentional   Emily Filbert, MD 01/19/16 2143

## 2016-01-19 NOTE — ED Notes (Signed)
Pt unable to ambulate at this time 

## 2016-01-19 NOTE — ED Notes (Signed)
Pt fron [facility] via EMS, facility reports she had an unwitnessed fall and presents with a laceration to the back of her head, bleeding is controlled at this time

## 2016-01-19 NOTE — Discharge Instructions (Signed)
Laceration Care, Adult °A laceration is a cut that goes through all of the layers of the skin and into the tissue that is right under the skin. Some lacerations heal on their own. Others need to be closed with stitches (sutures), staples, skin adhesive strips, or skin glue. Proper laceration care minimizes the risk of infection and helps the laceration to heal better. °HOW TO CARE FOR YOUR LACERATION °If sutures or staples were used: °· Keep the wound clean and dry. °· If you were given a bandage (dressing), you should change it at least one time per day or as told by your health care provider. You should also change it if it becomes wet or dirty. °· Keep the wound completely dry for the first 24 hours or as told by your health care provider. After that time, you may shower or bathe. However, make sure that the wound is not soaked in water until after the sutures or staples have been removed. °· Clean the wound one time each day or as told by your health care provider: °· Wash the wound with soap and water. °· Rinse the wound with water to remove all soap. °· Pat the wound dry with a clean towel. Do not rub the wound. °· After cleaning the wound, apply a thin layer of antibiotic ointment as told by your health care provider. This will help to prevent infection and keep the dressing from sticking to the wound. °· Have the sutures or staples removed as told by your health care provider. °If skin adhesive strips were used: °· Keep the wound clean and dry. °· If you were given a bandage (dressing), you should change it at least one time per day or as told by your health care provider. You should also change it if it becomes dirty or wet. °· Do not get the skin adhesive strips wet. You may shower or bathe, but be careful to keep the wound dry. °· If the wound gets wet, pat it dry with a clean towel. Do not rub the wound. °· Skin adhesive strips fall off on their own. You may trim the strips as the wound heals. Do not  remove skin adhesive strips that are still stuck to the wound. They will fall off in time. °If skin glue was used: °· Try to keep the wound dry, but you may briefly wet it in the shower or bath. Do not soak the wound in water, such as by swimming. °· After you have showered or bathed, gently pat the wound dry with a clean towel. Do not rub the wound. °· Do not do any activities that will make you sweat heavily until the skin glue has fallen off on its own. °· Do not apply liquid, cream, or ointment medicine to the wound while the skin glue is in place. Using those may loosen the film before the wound has healed. °· If you were given a bandage (dressing), you should change it at least one time per day or as told by your health care provider. You should also change it if it becomes dirty or wet. °· If a dressing is placed over the wound, be careful not to apply tape directly over the skin glue. Doing that may cause the glue to be pulled off before the wound has healed. °· Do not pick at the glue. The skin glue usually remains in place for 5-10 days, then it falls off of the skin. °General Instructions °· Take over-the-counter and prescription   medicines only as told by your health care provider. °· If you were prescribed an antibiotic medicine or ointment, take or apply it as told by your doctor. Do not stop using it even if your condition improves. °· To help prevent scarring, make sure to cover your wound with sunscreen whenever you are outside after stitches are removed, after adhesive strips are removed, or when glue remains in place and the wound is healed. Make sure to wear a sunscreen of at least 30 SPF. °· Do not scratch or pick at the wound. °· Keep all follow-up visits as told by your health care provider. This is important. °· Check your wound every day for signs of infection. Watch for: °· Redness, swelling, or pain. °· Fluid, blood, or pus. °· Raise (elevate) the injured area above the level of your heart  while you are sitting or lying down, if possible. °SEEK MEDICAL CARE IF: °· You received a tetanus shot and you have swelling, severe pain, redness, or bleeding at the injection site. °· You have a fever. °· A wound that was closed breaks open. °· You notice a bad smell coming from your wound or your dressing. °· You notice something coming out of the wound, such as wood or glass. °· Your pain is not controlled with medicine. °· You have increased redness, swelling, or pain at the site of your wound. °· You have fluid, blood, or pus coming from your wound. °· You notice a change in the color of your skin near your wound. °· You need to change the dressing frequently due to fluid, blood, or pus draining from the wound. °· You develop a new rash. °· You develop numbness around the wound. °SEEK IMMEDIATE MEDICAL CARE IF: °· You develop severe swelling around the wound. °· Your pain suddenly increases and is severe. °· You develop painful lumps near the wound or on skin that is anywhere on your body. °· You have a red streak going away from your wound. °· The wound is on your hand or foot and you cannot properly move a finger or toe. °· The wound is on your hand or foot and you notice that your fingers or toes look pale or bluish. °  °This information is not intended to replace advice given to you by your health care provider. Make sure you discuss any questions you have with your health care provider. °  °Document Released: 06/29/2005 Document Revised: 11/13/2014 Document Reviewed: 06/25/2014 °Elsevier Interactive Patient Education ©2016 Else[employer] Inc[employer] ° °Head Injury, Adult °You have a head injury. Headaches and throwing up (vomiting) are common after a head injury. It should be easy to wake up from sleeping. Sometimes you must stay in the hospital. Most problems happen within the first 24 hours. Side effects may occur up to 7-10 days after the injury.  °WHAT ARE THE TYPES OF HEAD INJURIES? °Head injuries can be as  minor as a bump. Some head injuries can be more severe. More severe head injuries include: °· A jarring injury to the brain (concussion). °· A bruise of the brain (contusion). This mean there is bleeding in the brain that can cause swelling. °· A cracked skull (skull fracture). °· Bleeding in the brain that collects, clots, and forms a bump (hematoma). °WHEN SHOULD I GET HELP RIGHT AWAY?  °· You are confused or sleepy. °· You cannot be woken up. °· You feel sick to your stomach (nauseous) or keep throwing up (vomiting). °· Your dizziness or   unsteadiness is getting worse. °· You have very bad, lasting headaches that are not helped by medicine. Take medicines only as told by your doctor. °· You cannot use your arms or legs like normal. °· You cannot walk. °· You notice changes in the black spots in the center of the colored part of your eye (pupil). °· You have clear or bloody fluid coming from your nose or ears. °· You have trouble seeing. °During the next 24 hours after the injury, you must stay with someone who can watch you. This person should get help right away (call 911 in the [place] if you start to shake and are not able to control it (have seizures), you pass out, or you are unable to wake up. °HOW CAN I PREVENT A HEAD INJURY IN THE FUTURE? °· Wear seat belts. °· Wear a helmet while bike riding and playing sports like football. °· Stay away from dangerous activities around the house. °WHEN CAN I RETURN TO NORMAL ACTIVITIES AND ATHLETICS? °See your doctor before doing these activities. You should not do normal activities or play contact sports until 1 week after the following symptoms have stopped: °· Headache that does not go away. °· Dizziness. °· Poor attention. °· Confusion. °· Memory problems. °· Sickness to your stomach or throwing up. °· Tiredness. °· Fussiness. °· Bothered by bright lights or loud noises. °· Anxiousness or depression. °· Restless sleep. °MAKE SURE YOU:  °· Understand these  instructions. °· Will watch your condition. °· Will get help right away if you are not doing well or get worse. °  °This information is not intended to replace advice given to you by your health care provider. Make sure you discuss any questions you have with your health care provider. °  °Document Released: 06/11/2008 Document Revised: 07/20/2014 Document Reviewed: 03/06/2013 °Elsevier Interactive Patient Education ©2016 Elsevier Inc. ° °

## 2017-10-26 ENCOUNTER — Encounter: Payer: Self-pay | Admitting: *Deleted

## 2017-11-02 ENCOUNTER — Encounter
Admission: RE | Disposition: A | Discharge status: Home / Self Care | Payer: Self-pay | Source: Ambulatory Visit | Attending: Ophthalmology

## 2017-11-02 ENCOUNTER — Ambulatory Visit: Payer: Medicare (Managed Care)

## 2017-11-02 ENCOUNTER — Other Ambulatory Visit: Payer: Self-pay

## 2017-11-02 ENCOUNTER — Ambulatory Visit
Admission: RE | Admit: 2017-11-02 | Discharge: 2017-11-02 | Disposition: A | Discharge status: Home / Self Care | Payer: Medicare (Managed Care) | Source: Ambulatory Visit | Attending: Ophthalmology | Admitting: Ophthalmology

## 2017-11-02 DIAGNOSIS — H2511 Age-related nuclear cataract, right eye: Secondary | ICD-10-CM | POA: Diagnosis not present

## 2017-11-02 DIAGNOSIS — F17290 Nicotine dependence, other tobacco product, uncomplicated: Secondary | ICD-10-CM | POA: Insufficient documentation

## 2017-11-02 DIAGNOSIS — Z79890 Hormone replacement therapy (postmenopausal): Secondary | ICD-10-CM | POA: Insufficient documentation

## 2017-11-02 DIAGNOSIS — I10 Essential (primary) hypertension: Secondary | ICD-10-CM | POA: Diagnosis not present

## 2017-11-02 DIAGNOSIS — E7800 Pure hypercholesterolemia, unspecified: Secondary | ICD-10-CM | POA: Diagnosis not present

## 2017-11-02 DIAGNOSIS — Z96641 Presence of right artificial hip joint: Secondary | ICD-10-CM | POA: Insufficient documentation

## 2017-11-02 DIAGNOSIS — F0390 Unspecified dementia without behavioral disturbance: Secondary | ICD-10-CM | POA: Diagnosis not present

## 2017-11-02 DIAGNOSIS — Z79899 Other long term (current) drug therapy: Secondary | ICD-10-CM | POA: Diagnosis not present

## 2017-11-02 DIAGNOSIS — E039 Hypothyroidism, unspecified: Secondary | ICD-10-CM | POA: Insufficient documentation

## 2017-11-02 HISTORY — DX: Weakness: R53.1

## 2017-11-02 HISTORY — DX: Other fatigue: R53.83

## 2017-11-02 HISTORY — PX: CATARACT EXTRACTION W/PHACO: SHX586

## 2017-11-02 SURGERY — PHACOEMULSIFICATION, CATARACT, WITH IOL INSERTION
Anesthesia: Monitor Anesthesia Care | Site: Eye | Laterality: Right | Wound class: "Clean "

## 2017-11-02 MED ORDER — POVIDONE-IODINE 5 % OP SOLN
OPHTHALMIC | Status: DC | PRN
  Administered 2017-11-02: 1 via OPHTHALMIC

## 2017-11-02 MED ORDER — MOXIFLOXACIN HCL 0.5 % OP SOLN
OPHTHALMIC | Status: AC
  Filled 2017-11-02: qty 3

## 2017-11-02 MED ORDER — EPINEPHRINE PF 1 MG/ML IJ SOLN
INTRAMUSCULAR | Status: AC
  Filled 2017-11-02: qty 1

## 2017-11-02 MED ORDER — FENTANYL CITRATE (PF) 100 MCG/2ML IJ SOLN
INTRAMUSCULAR | Status: AC
Start: 2017-11-02 — End: 2017-11-02
  Filled 2017-11-02: qty 2

## 2017-11-02 MED ORDER — ARMC OPHTHALMIC DILATING DROPS
1.0000 "application " | OPHTHALMIC | Status: AC
  Administered 2017-11-02 (×3): 1 via OPHTHALMIC

## 2017-11-02 MED ORDER — ARMC OPHTHALMIC DILATING DROPS
OPHTHALMIC | Status: AC
  Filled 2017-11-02: qty 0.4

## 2017-11-02 MED ORDER — MOXIFLOXACIN HCL 0.5 % OP SOLN: 1.0000 [drp] | OPHTHALMIC | Status: DC | PRN

## 2017-11-02 MED ORDER — LIDOCAINE HCL (PF) 4 % IJ SOLN
INTRAMUSCULAR | Status: AC
  Filled 2017-11-02: qty 5

## 2017-11-02 MED ORDER — MOXIFLOXACIN HCL 0.5 % OP SOLN
OPHTHALMIC | Status: DC | PRN
  Administered 2017-11-02: .2 mL via OPHTHALMIC

## 2017-11-02 MED ORDER — NA CHONDROIT SULF-NA HYALURON 40-17 MG/ML IO SOLN
INTRAOCULAR | Status: AC
Start: 2017-11-02 — End: 2017-11-02
  Filled 2017-11-02: qty 1

## 2017-11-02 MED ORDER — POVIDONE-IODINE 5 % OP SOLN
OPHTHALMIC | Status: AC
  Filled 2017-11-02: qty 30

## 2017-11-02 MED ORDER — CARBACHOL 0.01 % IO SOLN
INTRAOCULAR | Status: DC | PRN
  Administered 2017-11-02: .5 mL via INTRAOCULAR

## 2017-11-02 MED ORDER — FENTANYL CITRATE (PF) 100 MCG/2ML IJ SOLN
INTRAMUSCULAR | Status: DC | PRN
  Administered 2017-11-02 (×4): 25 ug via INTRAVENOUS

## 2017-11-02 MED ORDER — MIDAZOLAM HCL 2 MG/2ML IJ SOLN
INTRAMUSCULAR | Status: AC
  Filled 2017-11-02: qty 2

## 2017-11-02 MED ORDER — NA CHONDROIT SULF-NA HYALURON 40-17 MG/ML IO SOLN
INTRAOCULAR | Status: DC | PRN
  Administered 2017-11-02: 1 mL via INTRAOCULAR

## 2017-11-02 MED ORDER — BSS IO SOLN
INTRAOCULAR | Status: DC | PRN
  Administered 2017-11-02: 2 mL via OPHTHALMIC

## 2017-11-02 MED ORDER — SODIUM CHLORIDE 0.9 % IV SOLN
INTRAVENOUS | Status: DC
  Administered 2017-11-02: 09:00:00 via INTRAVENOUS

## 2017-11-02 MED ORDER — BSS IO SOLN
INTRAOCULAR | Status: DC | PRN
  Administered 2017-11-02: 1 mL via OPHTHALMIC

## 2017-11-02 SURGICAL SUPPLY — 16 items
GLOVE BIO SURGEON STRL SZ8 (GLOVE) ×3 IMPLANT
GLOVE BIOGEL M 6.5 STRL (GLOVE) ×3 IMPLANT
GLOVE SURG LX 8.0 MICRO (GLOVE) ×2
GLOVE SURG LX STRL 8.0 MICRO (GLOVE) ×1 IMPLANT
GOWN STRL REUS W/ TWL LRG LVL3 (GOWN DISPOSABLE) ×2 IMPLANT
GOWN STRL REUS W/TWL LRG LVL3 (GOWN DISPOSABLE) ×4
LABEL CATARACT MEDS ST (LABEL) ×3 IMPLANT
LENS IOL TECNIS ITEC 19.0 (Intraocular Lens) ×2 IMPLANT
PACK CATARACT (MISCELLANEOUS) ×3 IMPLANT
PACK CATARACT BRASINGTON LX (MISCELLANEOUS) ×3 IMPLANT
PACK EYE AFTER SURG (MISCELLANEOUS) ×3 IMPLANT
SOL BSS BAG (MISCELLANEOUS) ×3
SOLUTION BSS BAG (MISCELLANEOUS) ×1 IMPLANT
SYR 5ML LL (SYRINGE) ×3 IMPLANT
WATER STERILE IRR 250ML POUR (IV SOLUTION) ×3 IMPLANT
WIPE NON LINTING 3.25X3.25 (MISCELLANEOUS) ×3 IMPLANT

## 2017-11-02 NOTE — H&P (Signed)
All labs reviewed. Abnormal studies sent to patients PCP when indicated.  Previous H&P reviewed, patient examined, there are NO CHANGES.  Brandy Fite Porfilio4/23/20199:48 AM

## 2017-11-02 NOTE — Transfer of Care (Signed)
Immediate Anesthesia Transfer of Care Note  Patient: Brandy Oconnor  Procedure(s) Performed: CATARACT EXTRACTION PHACO AND INTRAOCULAR LENS PLACEMENT (IOC) (Right Eye)  Patient Location: PACU  Anesthesia Type:MAC  Level of Consciousness: awake, alert  and oriented  Airway & Oxygen Therapy: Patient Spontanous Breathing  Post-op Assessment: Report given to RN  Post vital signs: Reviewed and stable  Last Vitals:  Vitals Value Taken Time  BP    Temp    Pulse    Resp    SpO2      Last Pain:  Vitals:   11/02/17 0849  TempSrc: Tympanic  PainSc: 0-No pain         Complications: No apparent anesthesia complications

## 2017-11-02 NOTE — Anesthesia Preprocedure Evaluation (Signed)
Anesthesia Evaluation  Patient identified by MRN, date of birth, ID band Patient awake    Reviewed: Allergy & Precautions, NPO status , Patient's Chart, lab work & pertinent test results  History of Anesthesia Complications Negative for: history of anesthetic complications  Airway Mallampati: III       Dental  (+) Upper Dentures, Lower Dentures   Pulmonary neg sleep apnea, neg COPD,           Cardiovascular hypertension, Pt. on medications and Pt. on home beta blockers (-) Past MI and (-) CHF (-) dysrhythmias (-) Valvular Problems/Murmurs     Neuro/Psych neg Seizures Depression Dementia    GI/Hepatic Neg liver ROS, neg GERD  ,  Endo/Other  neg diabetesHypothyroidism   Renal/GU negative Renal ROS     Musculoskeletal   Abdominal   Peds  Hematology   Anesthesia Other Findings   Reproductive/Obstetrics                             Anesthesia Physical Anesthesia Plan  ASA: III  Anesthesia Plan: MAC   Post-op Pain Management:    Induction:   PONV Risk Score and Plan:   Airway Management Planned: Nasal Cannula  Additional Equipment:   Intra-op Plan:   Post-operative Plan:   Informed Consent: I have reviewed the patients History and Physical, chart, labs and discussed the procedure including the risks, benefits and alternatives for the proposed anesthesia with the patient or authorized representative who has indicated his/her understanding and acceptance.     Plan Discussed with:   Anesthesia Plan Comments:         Anesthesia Quick Evaluation

## 2017-11-02 NOTE — Discharge Instructions (Signed)
FOLLOW DR. PORFILIO'S POSTOP EYE DROP INSTRUCTION SHEET AS REVIEWED.  Eye Surgery Discharge Instructions  Expect mild scratchy sensation or mild soreness. DO NOT RUB YOUR EYE!  The day of surgery:  Minimal physical activity, but bed rest is not required  No reading, computer work, or close hand work  No bending, lifting, or straining.  May watch TV  For 24 hours:  No driving, legal decisions, or alcoholic beverages  Safety precautions  Eat anything you prefer: It is better to start with liquids, then soup then solid foods.  _____ Eye patch should be worn until postoperative exam tomorrow.  ____ Solar shield eyeglasses should be worn for comfort in the sunlight/patch while sleeping  Resume all regular medications including aspirin or Coumadin if these were discontinued prior to surgery. You may shower, bathe, shave, or wash your hair. Tylenol may be taken for mild discomfort.  Call your doctor if you experience significant pain, nausea, or vomiting, fever > 101 or other signs of infection. [phone] or [phone] Specific instructions:  Follow-up Information    Galen ManilaPorfilio, William, MD Follow up.   Specialty:  Ophthalmology Why:  Wednesday 11/03/17 @ 10:20 am  Contact information: [address] [place] [id number] [phone]

## 2017-11-02 NOTE — Anesthesia Post-op Follow-up Note (Signed)
Anesthesia QCDR form completed.        

## 2017-11-02 NOTE — OR Nursing (Signed)
Discharge pending transportation (daugther transporting with patient via [employer].

## 2017-11-02 NOTE — Anesthesia Postprocedure Evaluation (Signed)
Anesthesia Post Note  Patient: Brandy Oconnor  Procedure(s) Performed: CATARACT EXTRACTION PHACO AND INTRAOCULAR LENS PLACEMENT (IOC) (Right Eye)  Patient location during evaluation: PACU Anesthesia Type: MAC Level of consciousness: awake and awake and alert Pain management: pain level controlled Vital Signs Assessment: post-procedure vital signs reviewed and stable Respiratory status: spontaneous breathing Cardiovascular status: blood pressure returned to baseline Postop Assessment: no apparent nausea or vomiting Anesthetic complications: no     Last Vitals:  Vitals:   11/02/17 0849  BP: (!) 183/88  Pulse: 67  Resp: 18  Temp: (!) 35.4 C  SpO2: 98%    Last Pain:  Vitals:   11/02/17 0849  TempSrc: Tympanic  PainSc: 0-No pain                 Eloise Harman Danija Gosa

## 2017-11-02 NOTE — Op Note (Signed)
PREOPERATIVE DIAGNOSIS:  Nuclear sclerotic cataract of the right eye.   POSTOPERATIVE DIAGNOSIS:  nuclear sclerotic cataract right eye   OPERATIVE PROCEDURE: Procedure(s): CATARACT EXTRACTION PHACO AND INTRAOCULAR LENS PLACEMENT (IOC)   SURGEON:  Galen ManilaWilliam Lydiana Milley, MD.   ANESTHESIA:  Anesthesiologist: Naomie DeanKephart, Kameren Baade K, MD CRNA: Karoline CaldwellStarr, Deana, CRNA Student Nurse Anesthetist: Marylen PontoVanderford, Kate W, RN  1.      Managed anesthesia care. 2.      0.281ml of Shugarcaine was instilled in the eye following the paracentesis.   COMPLICATIONS:  None.   TECHNIQUE:   Stop and chop   DESCRIPTION OF PROCEDURE:  The patient was examined and consented in the preoperative holding area where the aforementioned topical anesthesia was applied to the right eye and then brought back to the Operating Room where the right eye was prepped and draped in the usual sterile ophthalmic fashion and a lid speculum was placed. A paracentesis was created with the side port blade and the anterior chamber was filled with viscoelastic. A near clear corneal incision was performed with the steel keratome. A continuous curvilinear capsulorrhexis was performed with a cystotome followed by the capsulorrhexis forceps. Hydrodissection and hydrodelineation were carried out with BSS on a blunt cannula. The lens was removed in a stop and chop  technique and the remaining cortical material was removed with the irrigation-aspiration handpiece. The capsular bag was inflated with viscoelastic and the Technis [id number]  lens was placed in the capsular bag without complication. The remaining viscoelastic was removed from the eye with the irrigation-aspiration handpiece. The wounds were hydrated. The anterior chamber was flushed with Miostat and the eye was inflated to physiologic pressure. 0.231ml of Vigamox was placed in the anterior chamber. The wounds were found to be water tight. The eye was dressed with Vigamox. The patient was given protective glasses  to wear throughout the day and a shield with which to sleep tonight. The patient was also given drops with which to begin a drop regimen today and will follow-up with me in one day. Implant Name Type Inv. Item Serial No. Manufacturer Lot No. LRB No. Used  LENS IOL DIOP 19.0 - [id number] Intraocular Lens LENS IOL DIOP 19.0 [id number] AMO  Right 1   Procedure(s) with comments: CATARACT EXTRACTION PHACO AND INTRAOCULAR LENS PLACEMENT (IOC) (Right) - US 00:37 AP% 15.0 CDE 5.58 Fluid pack lot # [id number]  Electronically signed: Galen ManilaWilliam Kiante Ciavarella 11/02/2017 10:11 AM

## 2017-11-29 ENCOUNTER — Encounter: Payer: Self-pay | Admitting: *Deleted

## 2017-12-07 ENCOUNTER — Ambulatory Visit: Payer: Medicare (Managed Care) | Admitting: Anesthesiology

## 2017-12-07 ENCOUNTER — Ambulatory Visit
Admission: RE | Admit: 2017-12-07 | Discharge: 2017-12-07 | Disposition: A | Discharge status: Home / Self Care | Payer: Medicare (Managed Care) | Source: Ambulatory Visit | Attending: Ophthalmology | Admitting: Ophthalmology

## 2017-12-07 ENCOUNTER — Encounter: Payer: Self-pay | Admitting: Certified Registered"

## 2017-12-07 ENCOUNTER — Encounter
Admission: RE | Disposition: A | Discharge status: Home / Self Care | Payer: Self-pay | Source: Ambulatory Visit | Attending: Ophthalmology

## 2017-12-07 ENCOUNTER — Other Ambulatory Visit: Payer: Self-pay

## 2017-12-07 DIAGNOSIS — M810 Age-related osteoporosis without current pathological fracture: Secondary | ICD-10-CM | POA: Insufficient documentation

## 2017-12-07 DIAGNOSIS — E7800 Pure hypercholesterolemia, unspecified: Secondary | ICD-10-CM | POA: Insufficient documentation

## 2017-12-07 DIAGNOSIS — H2512 Age-related nuclear cataract, left eye: Secondary | ICD-10-CM | POA: Diagnosis not present

## 2017-12-07 DIAGNOSIS — F329 Major depressive disorder, single episode, unspecified: Secondary | ICD-10-CM | POA: Insufficient documentation

## 2017-12-07 DIAGNOSIS — Z79899 Other long term (current) drug therapy: Secondary | ICD-10-CM | POA: Insufficient documentation

## 2017-12-07 DIAGNOSIS — M1990 Unspecified osteoarthritis, unspecified site: Secondary | ICD-10-CM | POA: Insufficient documentation

## 2017-12-07 DIAGNOSIS — Z7983 Long term (current) use of bisphosphonates: Secondary | ICD-10-CM | POA: Diagnosis not present

## 2017-12-07 DIAGNOSIS — Z79890 Hormone replacement therapy (postmenopausal): Secondary | ICD-10-CM | POA: Insufficient documentation

## 2017-12-07 DIAGNOSIS — I10 Essential (primary) hypertension: Secondary | ICD-10-CM | POA: Insufficient documentation

## 2017-12-07 DIAGNOSIS — K219 Gastro-esophageal reflux disease without esophagitis: Secondary | ICD-10-CM | POA: Diagnosis not present

## 2017-12-07 DIAGNOSIS — E890 Postprocedural hypothyroidism: Secondary | ICD-10-CM | POA: Insufficient documentation

## 2017-12-07 HISTORY — PX: CATARACT EXTRACTION W/PHACO: SHX586

## 2017-12-07 SURGERY — PHACOEMULSIFICATION, CATARACT, WITH IOL INSERTION
Anesthesia: Monitor Anesthesia Care | Site: Eye | Laterality: Left | Wound class: "Clean "

## 2017-12-07 MED ORDER — ARMC OPHTHALMIC DILATING DROPS
OPHTHALMIC | Status: AC
  Administered 2017-12-07: 1 via OPHTHALMIC
  Filled 2017-12-07: qty 0.4

## 2017-12-07 MED ORDER — NA CHONDROIT SULF-NA HYALURON 40-17 MG/ML IO SOLN
INTRAOCULAR | Status: AC
  Filled 2017-12-07: qty 1

## 2017-12-07 MED ORDER — FENTANYL CITRATE (PF) 100 MCG/2ML IJ SOLN
INTRAMUSCULAR | Status: AC
  Filled 2017-12-07: qty 2

## 2017-12-07 MED ORDER — GLYCOPYRROLATE 0.2 MG/ML IJ SOLN
INTRAMUSCULAR | Status: AC
Start: 2017-12-07 — End: ?
  Filled 2017-12-07: qty 1

## 2017-12-07 MED ORDER — BSS IO SOLN
INTRAOCULAR | Status: DC | PRN
  Administered 2017-12-07: 2 mL via OPHTHALMIC

## 2017-12-07 MED ORDER — SODIUM CHLORIDE 0.9 % IV SOLN
INTRAVENOUS | Status: DC
  Administered 2017-12-07: 11:00:00 via INTRAVENOUS

## 2017-12-07 MED ORDER — GLYCOPYRROLATE 0.2 MG/ML IJ SOLN
INTRAMUSCULAR | Status: DC | PRN
  Administered 2017-12-07: 0.1 mg via INTRAVENOUS

## 2017-12-07 MED ORDER — POVIDONE-IODINE 5 % OP SOLN
OPHTHALMIC | Status: AC
  Filled 2017-12-07: qty 30

## 2017-12-07 MED ORDER — MOXIFLOXACIN HCL 0.5 % OP SOLN
OPHTHALMIC | Status: DC | PRN
  Administered 2017-12-07: .2 mL via OPHTHALMIC

## 2017-12-07 MED ORDER — POVIDONE-IODINE 5 % OP SOLN
OPHTHALMIC | Status: DC | PRN
  Administered 2017-12-07: 1 via OPHTHALMIC

## 2017-12-07 MED ORDER — EPINEPHRINE PF 1 MG/ML IJ SOLN
INTRAMUSCULAR | Status: AC
  Filled 2017-12-07: qty 1

## 2017-12-07 MED ORDER — FENTANYL CITRATE (PF) 100 MCG/2ML IJ SOLN
INTRAMUSCULAR | Status: DC | PRN
  Administered 2017-12-07: 50 ug via INTRAVENOUS

## 2017-12-07 MED ORDER — ONDANSETRON HCL 4 MG/2ML IJ SOLN
INTRAMUSCULAR | Status: AC
  Filled 2017-12-07: qty 2

## 2017-12-07 MED ORDER — NA CHONDROIT SULF-NA HYALURON 40-17 MG/ML IO SOLN
INTRAOCULAR | Status: DC | PRN
  Administered 2017-12-07: 1 mL via INTRAOCULAR

## 2017-12-07 MED ORDER — EPINEPHRINE PF 1 MG/ML IJ SOLN
INTRAOCULAR | Status: DC | PRN
  Administered 2017-12-07: 1 mL via OPHTHALMIC

## 2017-12-07 MED ORDER — LIDOCAINE HCL (PF) 4 % IJ SOLN
INTRAMUSCULAR | Status: AC
  Filled 2017-12-07: qty 5

## 2017-12-07 MED ORDER — MOXIFLOXACIN HCL 0.5 % OP SOLN
OPHTHALMIC | Status: AC
  Filled 2017-12-07: qty 3

## 2017-12-07 MED ORDER — CARBACHOL 0.01 % IO SOLN
INTRAOCULAR | Status: DC | PRN
  Administered 2017-12-07: .5 mL via INTRAOCULAR

## 2017-12-07 MED ORDER — ONDANSETRON HCL 4 MG/2ML IJ SOLN
INTRAMUSCULAR | Status: DC | PRN
  Administered 2017-12-07: 4 mg via INTRAVENOUS

## 2017-12-07 MED ORDER — ONDANSETRON HCL 4 MG/2ML IJ SOLN: 4.0000 mg | Freq: Once | INTRAMUSCULAR | Status: DC | PRN

## 2017-12-07 MED ORDER — MOXIFLOXACIN HCL 0.5 % OP SOLN: 1.0000 [drp] | OPHTHALMIC | Status: DC | PRN

## 2017-12-07 MED ORDER — ARMC OPHTHALMIC DILATING DROPS
1.0000 "application " | OPHTHALMIC | Status: AC
  Administered 2017-12-07 (×3): 1 via OPHTHALMIC

## 2017-12-07 SURGICAL SUPPLY — 16 items
GLOVE BIO SURGEON STRL SZ8 (GLOVE) ×3 IMPLANT
GLOVE BIOGEL M 6.5 STRL (GLOVE) ×3 IMPLANT
GLOVE SURG LX 8.0 MICRO (GLOVE) ×2
GLOVE SURG LX STRL 8.0 MICRO (GLOVE) ×1 IMPLANT
GOWN STRL REUS W/ TWL LRG LVL3 (GOWN DISPOSABLE) ×2 IMPLANT
GOWN STRL REUS W/TWL LRG LVL3 (GOWN DISPOSABLE) ×4
LABEL CATARACT MEDS ST (LABEL) ×3 IMPLANT
LENS IOL TECNIS ITEC 18.0 (Intraocular Lens) ×2 IMPLANT
PACK CATARACT (MISCELLANEOUS) ×3 IMPLANT
PACK CATARACT BRASINGTON LX (MISCELLANEOUS) ×3 IMPLANT
PACK EYE AFTER SURG (MISCELLANEOUS) ×3 IMPLANT
SOL BSS BAG (MISCELLANEOUS) ×3
SOLUTION BSS BAG (MISCELLANEOUS) ×1 IMPLANT
SYR 5ML LL (SYRINGE) ×3 IMPLANT
WATER STERILE IRR 250ML POUR (IV SOLUTION) ×3 IMPLANT
WIPE NON LINTING 3.25X3.25 (MISCELLANEOUS) ×3 IMPLANT

## 2017-12-07 NOTE — Anesthesia Postprocedure Evaluation (Signed)
Anesthesia Post Note  Patient: Marialuiza Car  Procedure(s) Performed: CATARACT EXTRACTION PHACO AND INTRAOCULAR LENS PLACEMENT (IOC) (Left Eye)  Patient location during evaluation: Short Stay Anesthesia Type: MAC Level of consciousness: awake and alert, oriented and patient cooperative Pain management: satisfactory to patient Vital Signs Assessment: post-procedure vital signs reviewed and stable Respiratory status: spontaneous breathing and respiratory function stable Cardiovascular status: blood pressure returned to baseline and stable Postop Assessment: no headache, no backache, patient able to bend at knees, no apparent nausea or vomiting, adequate PO intake and able to ambulate Anesthetic complications: no     Last Vitals:  Vitals:   12/07/17 1047  BP: (!) 166/69  Pulse: 62  Resp: 20  Temp: 36.8 C  SpO2: 98%    Last Pain:  Vitals:   12/07/17 1047  TempSrc: Temporal  PainSc: 0-No pain                 Smt Lokey H Arlys Scatena

## 2017-12-07 NOTE — Op Note (Signed)
PREOPERATIVE DIAGNOSIS:  Nuclear sclerotic cataract of the left eye.   POSTOPERATIVE DIAGNOSIS:  Nuclear sclerotic cataract of the left eye.   OPERATIVE PROCEDURE: Procedure(s): CATARACT EXTRACTION PHACO AND INTRAOCULAR LENS PLACEMENT (IOC)   SURGEON:  Galen Manila, MD.   ANESTHESIA:  Anesthesiologist: Christia Reading, MD CRNA: Sherol Dade, CRNA  1.      Managed anesthesia care. 2.     0.47ml of Shugarcaine was instilled following the paracentesis   COMPLICATIONS:  None.   TECHNIQUE:   Stop and chop   DESCRIPTION OF PROCEDURE:  The patient was examined and consented in the preoperative holding area where the aforementioned topical anesthesia was applied to the left eye and then brought back to the Operating Room where the left eye was prepped and draped in the usual sterile ophthalmic fashion and a lid speculum was placed. A paracentesis was created with the side port blade and the anterior chamber was filled with viscoelastic. A near clear corneal incision was performed with the steel keratome. A continuous curvilinear capsulorrhexis was performed with a cystotome followed by the capsulorrhexis forceps. Hydrodissection and hydrodelineation were carried out with BSS on a blunt cannula. The lens was removed in a stop and chop  technique and the remaining cortical material was removed with the irrigation-aspiration handpiece. The capsular bag was inflated with viscoelastic and the Technis [id number] lens was placed in the capsular bag without complication. The remaining viscoelastic was removed from the eye with the irrigation-aspiration handpiece. The wounds were hydrated. The anterior chamber was flushed with Miostat and the eye was inflated to physiologic pressure. 0.14ml Vigamox was placed in the anterior chamber. The wounds were found to be water tight. The eye was dressed with Vigamox. The patient was given protective glasses to wear throughout the day and a shield with which to sleep  tonight. The patient was also given drops with which to begin a drop regimen today and will follow-up with me in one day. Implant Name Type Inv. Item Serial No. Manufacturer Lot No. LRB No. Used  LENS IOL DIOP 18.0 - [id number] Intraocular Lens LENS IOL DIOP 18.0 [id number]  Left 1    Procedure(s) with comments: CATARACT EXTRACTION PHACO AND INTRAOCULAR LENS PLACEMENT (IOC) (Left) - [place] 00:34 AP% 14.2 CDE 4.88 Fluid pack lot # [id number]  Electronically signed: Galen Manila 12/07/2017 12:01 PM

## 2017-12-07 NOTE — Discharge Instructions (Addendum)
FOLLOW DR. PORFILIO'S POSTOP EYE DROP INSTRUCTION SHEET AS REVIEWED.  Eye Surgery Discharge Instructions  Expect mild scratchy sensation or mild soreness. DO NOT RUB YOUR EYE!  The day of surgery:  Minimal physical activity, but bed rest is not required  No reading, computer work, or close hand work  No bending, lifting, or straining.  May watch TV  For 24 hours:  No driving, legal decisions, or alcoholic beverages  Safety precautions  Eat anything you prefer: It is better to start with liquids, then soup then solid foods.  Solar shield eyeglasses should be worn for comfort in the sunlight/patch while sleeping  Resume all regular medications including aspirin or Coumadin if these were discontinued prior to surgery. You may shower, bathe, shave, or wash your hair. Tylenol may be taken for mild discomfort.  Call your doctor if you experience significant pain, nausea, or vomiting, fever > 101 or other signs of infection. [phone] or [phone] Specific instructions:  Follow-up Information    Galen Manila, MD Follow up.   Specialty:  Ophthalmology Why:  12/08/17 @ 10:40 AM Contact information: [address] [place] [address] [phone]

## 2017-12-07 NOTE — Anesthesia Preprocedure Evaluation (Signed)
Anesthesia Evaluation  Patient identified by MRN, date of birth, ID band Patient awake    Reviewed: Allergy & Precautions, NPO status , Patient's Chart, lab work & pertinent test results, reviewed documented beta blocker date and time   History of Anesthesia Complications Negative for: history of anesthetic complications  Airway Mallampati: III       Dental   Pulmonary neg sleep apnea, neg COPD,           Cardiovascular hypertension, Pt. on medications (-) Past MI and (-) CHF (-) dysrhythmias (-) Valvular Problems/Murmurs     Neuro/Psych neg Seizures Depression Dementia Parkinson's dz    GI/Hepatic Neg liver ROS, GERD  Medicated and Controlled,  Endo/Other  neg diabetesHypothyroidism   Renal/GU negative Renal ROS     Musculoskeletal   Abdominal   Peds  Hematology   Anesthesia Other Findings   Reproductive/Obstetrics                             Anesthesia Physical Anesthesia Plan  ASA: III  Anesthesia Plan: MAC   Post-op Pain Management:    Induction:   PONV Risk Score and Plan:   Airway Management Planned: Nasal Cannula  Additional Equipment:   Intra-op Plan:   Post-operative Plan:   Informed Consent: I have reviewed the patients History and Physical, chart, labs and discussed the procedure including the risks, benefits and alternatives for the proposed anesthesia with the patient or authorized representative who has indicated his/her understanding and acceptance.     Plan Discussed with:   Anesthesia Plan Comments:         Anesthesia Quick Evaluation

## 2017-12-07 NOTE — Anesthesia Post-op Follow-up Note (Signed)
Anesthesia QCDR form completed.        

## 2017-12-07 NOTE — Transfer of Care (Signed)
Immediate Anesthesia Transfer of Care Note  Patient: Brandy Oconnor  Procedure(s) Performed: CATARACT EXTRACTION PHACO AND INTRAOCULAR LENS PLACEMENT (IOC) (Left Eye)  Patient Location: Short Stay  Anesthesia Type:MAC  Level of Consciousness: awake, alert , oriented and patient cooperative  Airway & Oxygen Therapy: Patient Spontanous Breathing  Post-op Assessment: Report given to RN, Post -op Vital signs reviewed and stable and Patient moving all extremities  Post vital signs: Reviewed and stable  Last Vitals:  Vitals Value Taken Time  BP    Temp    Pulse    Resp    SpO2      Last Pain:  Vitals:   12/07/17 1047  TempSrc: Temporal  PainSc: 0-No pain         Complications: No apparent anesthesia complications

## 2017-12-07 NOTE — H&P (Signed)
All labs reviewed. Abnormal studies sent to patients PCP when indicated.  Previous H&P reviewed, patient examined, there are NO CHANGES.  Brandy Resnick Porfilio5/28/201911:36 AM

## 2018-12-17 ENCOUNTER — Emergency Department: Payer: Medicare (Managed Care)

## 2018-12-17 ENCOUNTER — Other Ambulatory Visit: Payer: Self-pay

## 2018-12-17 ENCOUNTER — Inpatient Hospital Stay
Admission: EM | Admit: 2018-12-17 | Discharge: 2018-12-19 | DRG: 392 | Disposition: A | Discharge status: Home / Self Care | Payer: Medicare (Managed Care) | Attending: Internal Medicine | Admitting: Internal Medicine

## 2018-12-17 DIAGNOSIS — Z1159 Encounter for screening for other viral diseases: Secondary | ICD-10-CM | POA: Diagnosis not present

## 2018-12-17 DIAGNOSIS — Z885 Allergy status to narcotic agent status: Secondary | ICD-10-CM | POA: Diagnosis not present

## 2018-12-17 DIAGNOSIS — Z66 Do not resuscitate: Secondary | ICD-10-CM | POA: Diagnosis not present

## 2018-12-17 DIAGNOSIS — E039 Hypothyroidism, unspecified: Secondary | ICD-10-CM | POA: Diagnosis present

## 2018-12-17 DIAGNOSIS — M1990 Unspecified osteoarthritis, unspecified site: Secondary | ICD-10-CM | POA: Diagnosis present

## 2018-12-17 DIAGNOSIS — Z20828 Contact with and (suspected) exposure to other viral communicable diseases: Secondary | ICD-10-CM | POA: Diagnosis present

## 2018-12-17 DIAGNOSIS — Z7983 Long term (current) use of bisphosphonates: Secondary | ICD-10-CM

## 2018-12-17 DIAGNOSIS — G250 Essential tremor: Secondary | ICD-10-CM | POA: Diagnosis present

## 2018-12-17 DIAGNOSIS — Z96641 Presence of right artificial hip joint: Secondary | ICD-10-CM | POA: Diagnosis present

## 2018-12-17 DIAGNOSIS — Z79890 Hormone replacement therapy (postmenopausal): Secondary | ICD-10-CM | POA: Diagnosis not present

## 2018-12-17 DIAGNOSIS — Z833 Family history of diabetes mellitus: Secondary | ICD-10-CM

## 2018-12-17 DIAGNOSIS — I10 Essential (primary) hypertension: Secondary | ICD-10-CM | POA: Diagnosis present

## 2018-12-17 DIAGNOSIS — E785 Hyperlipidemia, unspecified: Secondary | ICD-10-CM | POA: Diagnosis present

## 2018-12-17 DIAGNOSIS — G20 Parkinson's disease: Secondary | ICD-10-CM | POA: Diagnosis present

## 2018-12-17 DIAGNOSIS — K219 Gastro-esophageal reflux disease without esophagitis: Secondary | ICD-10-CM | POA: Diagnosis present

## 2018-12-17 DIAGNOSIS — K5792 Diverticulitis of intestine, part unspecified, without perforation or abscess without bleeding: Secondary | ICD-10-CM | POA: Diagnosis not present

## 2018-12-17 DIAGNOSIS — K5732 Diverticulitis of large intestine without perforation or abscess without bleeding: Secondary | ICD-10-CM | POA: Diagnosis not present

## 2018-12-17 DIAGNOSIS — F0280 Dementia in other diseases classified elsewhere without behavioral disturbance: Secondary | ICD-10-CM | POA: Diagnosis present

## 2018-12-17 LAB — URINALYSIS, COMPLETE (UACMP) WITH MICROSCOPIC
Bacteria, UA: NONE SEEN
Bilirubin Urine: NEGATIVE
Glucose, UA: NEGATIVE mg/dL
Ketones, ur: NEGATIVE mg/dL
Leukocytes,Ua: NEGATIVE
Nitrite: NEGATIVE
Protein, ur: NEGATIVE mg/dL
Specific Gravity, Urine: 1.032 — ABNORMAL HIGH (ref 1.005–1.030)
pH: 6 (ref 5.0–8.0)

## 2018-12-17 LAB — COMPREHENSIVE METABOLIC PANEL
ALT: 5 U/L (ref 0–44)
AST: 24 U/L (ref 15–41)
Albumin: 3.7 g/dL (ref 3.5–5.0)
Alkaline Phosphatase: 92 U/L (ref 38–126)
Anion gap: 9 (ref 5–15)
BUN: 19 mg/dL (ref 8–23)
CO2: 23 mmol/L (ref 22–32)
Calcium: 8.7 mg/dL — ABNORMAL LOW (ref 8.9–10.3)
Chloride: 109 mmol/L (ref 98–111)
Creatinine, Ser: 0.88 mg/dL (ref 0.44–1.00)
GFR calc Af Amer: >60 mL/min (ref >60)
GFR calc non Af Amer: 59 mL/min — ABNORMAL LOW (ref >60)
Glucose, Bld: 140 mg/dL — ABNORMAL HIGH (ref 70–99)
Potassium: 4.1 mmol/L (ref 3.5–5.1)
Sodium: 141 mmol/L (ref 135–145)
Total Bilirubin: 0.6 mg/dL (ref 0.3–1.2)
Total Protein: 6.4 g/dL — ABNORMAL LOW (ref 6.5–8.1)

## 2018-12-17 LAB — TROPONIN I: Troponin I: <0.03 ng/mL (ref <0.03)

## 2018-12-17 LAB — GASTROINTESTINAL PANEL BY PCR, STOOL (REPLACES STOOL CULTURE)

## 2018-12-17 LAB — CBC
HCT: 46.5 % — ABNORMAL HIGH (ref 36.0–46.0)
Hemoglobin: 15.6 g/dL — ABNORMAL HIGH (ref 12.0–15.0)
MCH: 30.6 pg (ref 26.0–34.0)
MCHC: 33.5 g/dL (ref 30.0–36.0)
MCV: 91.4 fL (ref 80.0–100.0)
Platelets: 206 10*3/uL (ref 150–400)
RBC: 5.09 MIL/uL (ref 3.87–5.11)
RDW: 13.8 % (ref 11.5–15.5)
WBC: 14.1 10*3/uL — ABNORMAL HIGH (ref 4.0–10.5)
nRBC: 0 % (ref 0.0–0.2)

## 2018-12-17 LAB — SARS CORONAVIRUS 2 BY RT PCR (HOSPITAL ORDER, PERFORMED IN ~~LOC~~ HOSPITAL LAB): SARS Coronavirus 2: NEGATIVE

## 2018-12-17 LAB — C DIFFICILE QUICK SCREEN W PCR REFLEX
C Diff antigen: NEGATIVE
C Diff interpretation: NOT DETECTED
C Diff toxin: NEGATIVE

## 2018-12-17 LAB — LIPASE, BLOOD: Lipase: 24 U/L (ref 11–51)

## 2018-12-17 MED ORDER — HYDRALAZINE HCL 20 MG/ML IJ SOLN: 10.0000 mg | INTRAMUSCULAR | Status: DC | PRN

## 2018-12-17 MED ORDER — PIPERACILLIN-TAZOBACTAM 3.375 G IVPB 30 MIN
3.3750 g | Freq: Once | INTRAVENOUS | Status: AC
  Administered 2018-12-17: 3.375 g via INTRAVENOUS
  Filled 2018-12-17: qty 50

## 2018-12-17 MED ORDER — ACETAMINOPHEN 650 MG RE SUPP: 650.0000 mg | Freq: Four times a day (QID) | RECTAL | Status: DC | PRN

## 2018-12-17 MED ORDER — ALBUTEROL SULFATE (2.5 MG/3ML) 0.083% IN NEBU: 2.5000 mg | INHALATION_SOLUTION | Freq: Four times a day (QID) | RESPIRATORY_TRACT | Status: DC

## 2018-12-17 MED ORDER — FLORANEX PO PACK
1.0000 g | PACK | Freq: Three times a day (TID) | ORAL | Status: DC
  Administered 2018-12-18 – 2018-12-19 (×4): 1 g via ORAL
  Filled 2018-12-17 (×6): qty 1

## 2018-12-17 MED ORDER — DEXTROSE-NACL 5-0.45 % IV SOLN
INTRAVENOUS | Status: DC
  Administered 2018-12-17: 20:00:00 via INTRAVENOUS

## 2018-12-17 MED ORDER — METRONIDAZOLE IN NACL 5-0.79 MG/ML-% IV SOLN
500.0000 mg | Freq: Three times a day (TID) | INTRAVENOUS | Status: DC
  Administered 2018-12-17 – 2018-12-19 (×6): 500 mg via INTRAVENOUS
  Filled 2018-12-17 (×7): qty 100

## 2018-12-17 MED ORDER — TRAZODONE HCL 50 MG PO TABS: 50.0000 mg | ORAL_TABLET | Freq: Every evening | ORAL | Status: DC | PRN

## 2018-12-17 MED ORDER — MORPHINE SULFATE (PF) 2 MG/ML IV SOLN
2.0000 mg | Freq: Once | INTRAVENOUS | Status: AC
  Administered 2018-12-17: 2 mg via INTRAVENOUS
  Filled 2018-12-17: qty 1

## 2018-12-17 MED ORDER — ALBUTEROL SULFATE (2.5 MG/3ML) 0.083% IN NEBU: 2.5000 mg | INHALATION_SOLUTION | Freq: Four times a day (QID) | RESPIRATORY_TRACT | Status: DC | PRN

## 2018-12-17 MED ORDER — ENOXAPARIN SODIUM 40 MG/0.4ML ~~LOC~~ SOLN
40.0000 mg | SUBCUTANEOUS | Status: DC
  Administered 2018-12-17 – 2018-12-18 (×2): 40 mg via SUBCUTANEOUS
  Filled 2018-12-17 (×2): qty 0.4

## 2018-12-17 MED ORDER — PANTOPRAZOLE SODIUM 40 MG IV SOLR
40.0000 mg | INTRAVENOUS | Status: DC
  Administered 2018-12-17 – 2018-12-18 (×2): 40 mg via INTRAVENOUS
  Filled 2018-12-17 (×2): qty 40

## 2018-12-17 MED ORDER — ONDANSETRON HCL 4 MG/2ML IJ SOLN: 4.0000 mg | Freq: Four times a day (QID) | INTRAMUSCULAR | Status: DC | PRN

## 2018-12-17 MED ORDER — ADULT MULTIVITAMIN W/MINERALS CH
1.0000 | ORAL_TABLET | Freq: Every day | ORAL | Status: DC
  Administered 2018-12-18 – 2018-12-19 (×2): 1 via ORAL
  Filled 2018-12-17 (×2): qty 1

## 2018-12-17 MED ORDER — HYDROCODONE-ACETAMINOPHEN 5-325 MG PO TABS: 1.0000 | ORAL_TABLET | ORAL | Status: DC | PRN

## 2018-12-17 MED ORDER — CIPROFLOXACIN IN D5W 400 MG/200ML IV SOLN
400.0000 mg | Freq: Two times a day (BID) | INTRAVENOUS | Status: DC
  Administered 2018-12-17 – 2018-12-19 (×4): 400 mg via INTRAVENOUS
  Filled 2018-12-17 (×5): qty 200

## 2018-12-17 MED ORDER — ONDANSETRON HCL 4 MG/2ML IJ SOLN
4.0000 mg | Freq: Once | INTRAMUSCULAR | Status: AC
  Administered 2018-12-17: 4 mg via INTRAVENOUS
  Filled 2018-12-17: qty 2

## 2018-12-17 MED ORDER — IOHEXOL 300 MG/ML  SOLN
75.0000 mL | Freq: Once | INTRAMUSCULAR | Status: AC | PRN
  Administered 2018-12-17: 75 mL via INTRAVENOUS

## 2018-12-17 MED ORDER — ACETAMINOPHEN 325 MG PO TABS: 650.0000 mg | ORAL_TABLET | Freq: Four times a day (QID) | ORAL | Status: DC | PRN

## 2018-12-17 MED ORDER — ONDANSETRON HCL 4 MG PO TABS: 4.0000 mg | ORAL_TABLET | Freq: Four times a day (QID) | ORAL | Status: DC | PRN

## 2018-12-17 NOTE — Progress Notes (Signed)
Called daughter Kieth Brightly to give update on patient. Gave room and floor number to daughter to call if needed.   Fuller Mandril, RN

## 2018-12-17 NOTE — H&P (Signed)
[facility] [place] [facility]   PATIENT NAME: Brandy DressSarah Oconnor    MR#:  [id number]  DATE OF BIRTH:  January 22, 1931  DATE OF ADMISSION:  12/17/2018  PRIMARY CARE PHYSICIAN: Patient, No Pcp Per   REQUESTING/REFERRING PHYSICIAN:   CHIEF COMPLAINT:   Chief Complaint  Patient presents with  . Abdominal Pain    HISTORY OF PRESENT ILLNESS: Brandy Oconnor  is a 85 y.o. female with a known history per below presented to emergency room with 2-3 day history of worsening abdominal pain, patient is poor historian due to dementia, information provided by daughter, in the emergency room work-up noted for acute severe diverticulitis on CT of the abdomen, white count 14,000, patient admitted for acute severe sigmoid diverticulitis.  PAST MEDICAL HISTORY:   Past Medical History:  Diagnosis Date  . Dementia (HCC)   . Depression   . Fatigue   . Femur fracture, right (HCC)   . GERD (gastroesophageal reflux disease)   . Hyperlipidemia   . Hypertension   . Hypothyroidism   . Osteoarthritis   . Tremor    benign essential tremor- tremors of voice, head and jaw  . Weakness     PAST SURGICAL HISTORY:  Past Surgical History:  Procedure Laterality Date  . ABDOMINAL HYSTERECTOMY    . APPENDECTOMY    . CATARACT EXTRACTION W/PHACO Right 11/02/2017   Procedure: CATARACT EXTRACTION PHACO AND INTRAOCULAR LENS PLACEMENT (IOC);  Surgeon: Galen ManilaPorfilio, William, MD;  Location: [facility] ORS;  Service: Ophthalmology;  Laterality: Right;  US 00:37 AP% 15.0 CDE 5.58 Fluid pack lot # [id number]  . CATARACT EXTRACTION W/PHACO Left 12/07/2017   Procedure: CATARACT EXTRACTION PHACO AND INTRAOCULAR LENS PLACEMENT (IOC);  Surgeon: Galen ManilaPorfilio, William, MD;  Location: [facility] ORS;  Service: Ophthalmology;  Laterality: Left;  US 00:34 AP% 14.2 CDE 4.88 Fluid pack lot # [id number]  . CHOLECYSTECTOMY    . THYROIDECTOMY, PARTIAL    . TOTAL HIP ARTHROPLASTY Right 05/24/2015   Procedure: TOTAL HIP ARTHROPLASTY ANTERIOR  APPROACH;  Surgeon: Kennedy BuckerMichael Menz, MD;  Location: [facility] ORS;  Service: Orthopedics;  Laterality: Right;    SOCIAL HISTORY:  Social History   Tobacco Use  . Smoking status: Never Smoker  . Smokeless tobacco: Current User    Types: Snuff  Substance Use Topics  . Alcohol use: No    FAMILY HISTORY:  Family History  Problem Relation Age of Onset  . Diabetes Mellitus II Mother   . Other Father        Tremors    DRUG ALLERGIES:  Allergies  Allergen Reactions  . Codeine Rash    REVIEW OF SYSTEMS: Poor historian due to dementia  CONSTITUTIONAL: No fever, fatigue or weakness.  EYES: No blurred or double vision.  EARS, NOSE, AND THROAT: No tinnitus or ear pain.  RESPIRATORY: No cough, shortness of breath, wheezing or hemoptysis.  CARDIOVASCULAR: No chest pain, orthopnea, edema.  GASTROINTESTINAL: No nausea, vomiting, +diarrhea, abdominal pain.  GENITOURINARY: No dysuria, hematuria.  ENDOCRINE: No polyuria, nocturia,  HEMATOLOGY: No anemia, easy bruising or bleeding SKIN: No rash or lesion. MUSCULOSKELETAL: No joint pain or arthritis.   NEUROLOGIC: No tingling, numbness, weakness.  PSYCHIATRY: No anxiety or depression.   MEDICATIONS AT HOME:  Prior to Admission medications   Medication Sig Start Date End Date Taking? Authorizing Provider  alendronate (FOSAMAX) 70 MG tablet Take 70 mg by mouth once a week. On Monday    [provider]  atenolol (TENORMIN) 50 MG tablet Take 50 mg by  mouth daily.    [provider]  carbidopa-levodopa (SINEMET IR) 10-100 MG tablet Take 1 tablet by mouth 2 (two) times daily.    [provider]  ketotifen (ZADITOR) 0.025 % ophthalmic solution Place 1 drop into both eyes daily.    [provider]  levothyroxine (SYNTHROID, LEVOTHROID) 100 MCG tablet Take 100 mcg by mouth daily.    [provider]  lisinopril (PRINIVIL,ZESTRIL) 10 MG tablet Take 10 mg by mouth daily.    [provider]  liver  oil-zinc oxide (DESITIN) 40 % ointment Apply 1 application topically as needed for irritation (after toileting).    [provider]  loperamide (IMODIUM A-D) 2 MG tablet Take 2 mg by mouth 4 (four) times daily as needed for diarrhea or loose stools.    [provider]  loratadine (CLARITIN) 10 MG tablet Take 10 mg by mouth daily.    [provider]  pravastatin (PRAVACHOL) 20 MG tablet Take 20 mg by mouth at bedtime.    [provider]  primidone (MYSOLINE) 250 MG tablet Take 250 mg by mouth 3 (three) times daily. At 8am, 1pm, and bedtime    [provider]  trospium (SANCTURA) 20 MG tablet Take 20 mg by mouth daily.    [provider]  Vitamin D, Ergocalciferol, (DRISDOL) 50000 UNITS CAPS capsule Take 50,000 Units by mouth every 30 (thirty) days. Once a month on the first Monday    [provider]      PHYSICAL EXAMINATION:   VITAL SIGNS: Blood pressure 123/64, pulse 75, temperature 98.2 F (36.8 C), temperature source Oral, resp. rate 12, height 5\' 6"  (1.676 m), weight 58 kg, SpO2 100 %.  GENERAL:  85 y.o.-year-old patient lying in the bed with mild acute distress.  Frail-appearing EYES: Pupils equal, round, reactive to light and accommodation. No scleral icterus. Extraocular muscles intact.  HEENT: Head atraumatic, normocephalic. Oropharynx and nasopharynx clear.  NECK:  Supple, no jugular venous distention. No thyroid enlargement, no tenderness.  LUNGS: Normal breath sounds bilaterally, no wheezing, rales,rhonchi or crepitation. No use of accessory muscles of respiration.  CARDIOVASCULAR: S1, S2 normal. No murmurs, rubs, or gallops.  ABDOMEN: Soft, left lower quadrant tender, nondistended. Bowel sounds present. No organomegaly or mass.  EXTREMITIES: No pedal edema, cyanosis, or clubbing.  NEUROLOGIC: Cranial nerves II through XII are intact. Muscle strength 5/5 in all extremities. Sensation intact. Gait not checked.   PSYCHIATRIC: The patient is confused and disoriented per baseline,  MAES.  SKIN: No obvious rash, lesion, or ulcer.   LABORATORY PANEL:   CBC Recent Labs  Lab 12/17/18 1439  WBC 14.1*  HGB 15.6*  HCT 46.5*  PLT 206  MCV 91.4  MCH 30.6  MCHC 33.5  RDW 13.8   ------------------------------------------------------------------------------------------------------------------  Chemistries  Recent Labs  Lab 12/17/18 1439  NA 141  K 4.1  CL 109  CO2 23  GLUCOSE 140*  BUN 19  CREATININE 0.88  CALCIUM 8.7*  AST 24  ALT 5  ALKPHOS 92  BILITOT 0.6   ------------------------------------------------------------------------------------------------------------------ estimated creatinine clearance is 42 mL/min (by C-G formula based on SCr of 0.88 mg/dL). ------------------------------------------------------------------------------------------------------------------ No results for input(s): TSH, T4TOTAL, T3FREE, THYROIDAB in the last 72 hours.  Invalid input(s): FREET3   Coagulation profile No results for input(s): INR, PROTIME in the last 168 hours. ------------------------------------------------------------------------------------------------------------------- No results for input(s): DDIMER in the last 72 hours. -------------------------------------------------------------------------------------------------------------------  Cardiac Enzymes Recent Labs  Lab 12/17/18 1439  TROPONINI <0.03   ------------------------------------------------------------------------------------------------------------------ Mertha Finders  input(s): POCBNP  ---------------------------------------------------------------------------------------------------------------  Urinalysis    Component Value Date/Time   COLORURINE YELLOW (A) 12/17/2018 1618   APPEARANCEUR CLEAR (A) 12/17/2018 1618   APPEARANCEUR Clear 11/08/2014 0059   LABSPEC 1.032 (H) 12/17/2018 1618   LABSPEC 1.009 11/08/2014  0059   PHURINE 6.0 12/17/2018 1618   GLUCOSEU NEGATIVE 12/17/2018 1618   GLUCOSEU Negative 11/08/2014 0059   HGBUR MODERATE (A) 12/17/2018 1618   BILIRUBINUR NEGATIVE 12/17/2018 1618   BILIRUBINUR Negative 11/08/2014 0059   KETONESUR NEGATIVE 12/17/2018 1618   PROTEINUR NEGATIVE 12/17/2018 1618   NITRITE NEGATIVE 12/17/2018 1618   LEUKOCYTESUR NEGATIVE 12/17/2018 1618   LEUKOCYTESUR Negative 11/08/2014 0059     RADIOLOGY: Dg Chest 1 View  Result Date: 12/17/2018 CLINICAL DATA:  Pain.  Dementia. EXAM: CHEST  1 VIEW COMPARISON:  January 04, 2016 FINDINGS: There is slight bibasilar atelectasis. There is no evident edema or consolidation. Heart size and pulmonary vascularity are normal. No adenopathy. No bone lesions. IMPRESSION: Mild bibasilar atelectasis. No edema or consolidation. Stable cardiac silhouette. Electronically Signed   By: Bretta BangWilliam  Woodruff III M.D.   On: 12/17/2018 16:08   Ct Abdomen Pelvis W Contrast  Result Date: 12/17/2018 CLINICAL DATA:  Abdominal pain, diarrhea EXAM: CT ABDOMEN AND PELVIS WITH CONTRAST TECHNIQUE: Multidetector CT imaging of the abdomen and pelvis was performed using the standard protocol following bolus administration of intravenous contrast. CONTRAST:  75mL OMNIPAQUE IOHEXOL 300 MG/ML  SOLN COMPARISON:  01/04/2016 FINDINGS: Lower chest: No acute abnormality.  Bibasilar scarring. Hepatobiliary: No focal liver abnormality is seen. Status post cholecystectomy. No biliary dilatation. Pancreas: Unremarkable. No pancreatic ductal dilatation or surrounding inflammatory changes. Spleen: Normal in size without significant abnormality. Adrenals/Urinary Tract: Adrenal glands are unremarkable. Kidneys are normal, without renal calculi, solid lesion, or hydronephrosis. Bladder is unremarkable. Stomach/Bowel: Stomach is within normal limits. There is severe sigmoid diverticulosis and inflammatory thickening about the proximal to mid sigmoid. Vascular/Lymphatic: Mixed calcific  atherosclerosis. No enlarged abdominal or pelvic lymph nodes. Reproductive: Status post hysterectomy. Other: No abdominal wall hernia or abnormality. Trace free fluid in the low abdomen and pelvis. Musculoskeletal: No acute or significant osseous findings. IMPRESSION: 1. There is severe sigmoid diverticulosis and inflammatory thickening about the proximal to mid sigmoid. Findings are consistent with acute diverticulitis. No evidence of perforation or abscess. 2. Trace nonspecific free fluid in the low abdomen and pelvis, likely reactive. 3. Other chronic, incidental, and postoperative findings as detailed above. Electronically Signed   By: Lauralyn PrimesAlex  Bibbey M.D.   On: 12/17/2018 16:37    EKG: Orders placed or performed during the hospital encounter of 12/17/18  . ED EKG  . ED EKG  . EKG 12-Lead  . EKG 12-Lead    IMPRESSION AND PLAN: *Acute severe sigmoid diverticulitis Admit to regular nursing for bed, empiric Flagyl, Cipro, adult pain protocol, IV fluids for rehydration, and continue close medical monitoring  *Chronic Parkinson's dementia Stable Continue Sinemet  *Chronic hypothyroidism, unspecified Continue Synthroid  *Chronic GERD PPI daily  *Chronic benign essential hypertension Stable Continue home regiment  *Chronic hyperlipidemia, unspecified Continue statin therapy  All the records are reviewed and case discussed with ED provider. Management plans discussed with the patient, family and they are in agreement.  CODE STATUS:full Code Status History    Date Active Date Inactive Code Status Order ID Comments User Context   01/04/2016 1341 01/07/2016 2054 DNR [id number]  Adrian SaranMody, Sital, MD ED   05/24/2015 1703 05/27/2015 1733 Full Code [id number]  Kennedy BuckerMenz, Michael, MD Inpatient  05/23/2015 1954 05/24/2015 1703 Full Code [id number]  Enid BaasKalisetti, Radhika, MD Inpatient    Questions for Most Recent Historical Code Status (Order [id number])    Question Answer Comment   In the event of cardiac  or respiratory ARREST Do not call a "code blue"    In the event of cardiac or respiratory ARREST Do not perform Intubation, CPR, defibrillation or ACLS    In the event of cardiac or respiratory ARREST Use medication by any route, position, wound care, and other measures to relive pain and suffering. May use oxygen, suction and manual treatment of airway obstruction as needed for comfort.        TOTAL TIME TAKING CARE OF THIS PATIENT: 40 minutes.    Evelena AsaMontell D [place] Flett M.D on 12/17/2018   Between 7am to 6pm - Pager - [phone]  After 6pm go to [email or web address] - password [email or web address]  [facility]  Office  [phone]  CC: Primary care physician; Patient, No Pcp Per   Note: This dictation was prepared with Dragon dictation along with smaller phrase technology. Any transcriptional errors that result from this process are unintentional.

## 2018-12-17 NOTE — Progress Notes (Signed)
Family Meeting Note  Advance Directive:yes  Today a meeting took place with the Patient.  Patient is able to participate   The following clinical team members were present during this meeting:MD  The following were discussed:Patient's diagnosis: 85 y.o. female with a known history per below presented to emergency room with 2-3 day history of worsening abdominal pain, patient is poor historian due to dementia, information provided by daughter, in the emergency room work-up noted for acute severe diverticulitis on CT of the abdomen, white count 14,000, patient admitted for acute severe sigmoid diverticulitis., Patient's progosis: Unable to determine and Goals for treatment: Full Code   Additional follow-up to be provided: prn  Time spent during discussion:20 minutes  Gorden Harms, MD

## 2018-12-17 NOTE — ED Triage Notes (Signed)
Pt comes from home via EMS after daughter reports pt c/o abdominal pain and diarrhea for a few days. Pt has hx of mild dementia. Pt had some snuff in her mouth upon EMS arrival. Pt oriented to self and situation, but not year or president.

## 2018-12-17 NOTE — ED Notes (Signed)
Patient transported to CT 

## 2018-12-17 NOTE — ED Notes (Signed)
ED TO INPATIENT HANDOFF REPORT  ED Nurse Name and Phone #: Janett Billow [phone]  S Name/Age/Gender Brandy Oconnor 85 y.o. female Room/Bed: ED04A/ED04A  Code Status   Code Status: Prior  Home/SNF/Other Home (cared for by [facility]) Patient oriented to: self, place, time and situation Is this baseline? Yes   Triage Complete: Triage complete  Chief Complaint EMS  Triage Note Pt comes from home via EMS after daughter reports pt c/o abdominal pain and diarrhea for a few days. Pt has hx of mild dementia. Pt had some snuff in her mouth upon EMS arrival. Pt oriented to self and situation, but not year or president.    Allergies Allergies  Allergen Reactions  . Codeine Rash    Level of Care/Admitting Diagnosis ED Disposition    ED Disposition Condition [facility]: [place] [[id number]]  Level of Care: Med-Surg [16]  Covid Evaluation: Confirmed COVID Negative  Diagnosis: Diverticulitis [[id number]]  Admitting Physician: Gorden Harms [[id number]]  Attending Physician: Gorden Harms [[id number]]  Estimated length of stay: past midnight tomorrow  Certification:: I certify this patient will need inpatient services for at least 2 midnights  PT Class (Do Not Modify): Inpatient [101]  PT Acc Code (Do Not Modify): Private [1]       B Medical/Surgery History Past Medical History:  Diagnosis Date  . Dementia ([place])   . Depression   . Fatigue   . Femur fracture, right ([place])   . GERD (gastroesophageal reflux disease)   . Hyperlipidemia   . Hypertension   . Hypothyroidism   . Osteoarthritis   . Tremor    benign essential tremor- tremors of voice, head and jaw  . Weakness    Past Surgical History:  Procedure Laterality Date  . ABDOMINAL HYSTERECTOMY    . APPENDECTOMY    . CATARACT EXTRACTION W/PHACO Right 11/02/2017   Procedure: CATARACT EXTRACTION PHACO AND INTRAOCULAR LENS PLACEMENT (IOC);  Surgeon: Birder Robson, MD;  Location: [facility] ORS;   Service: Ophthalmology;  Laterality: Right;  [place] 00:37 AP% 15.0 CDE 5.58 Fluid pack lot # [id number]  . CATARACT EXTRACTION W/PHACO Left 12/07/2017   Procedure: CATARACT EXTRACTION PHACO AND INTRAOCULAR LENS PLACEMENT (IOC);  Surgeon: Birder Robson, MD;  Location: [facility] ORS;  Service: Ophthalmology;  Laterality: Left;  [place] 00:34 AP% 14.2 CDE 4.88 Fluid pack lot # [id number]  . CHOLECYSTECTOMY    . THYROIDECTOMY, PARTIAL    . TOTAL HIP ARTHROPLASTY Right 05/24/2015   Procedure: TOTAL HIP ARTHROPLASTY ANTERIOR APPROACH;  Surgeon: Hessie Knows, MD;  Location: [facility] ORS;  Service: Orthopedics;  Laterality: Right;     A IV Location/Drains/Wounds Patient Lines/Drains/Airways Status   Active Line/Drains/Airways    Name:   Placement date:   Placement time:   Site:   Days:   Peripheral IV 12/17/18 Left Hand   12/17/18    1439    Hand   less than 1   Airway   12/07/17    1142     375   Incision (Closed) 05/24/15 Hip Right   05/24/15    1540     1303   Incision (Closed) 11/02/17 Eye Right   11/02/17    0953     410   Incision (Closed) 12/07/17 Eye Left   12/07/17    1144     375          Intake/Output Last 24 hours No intake or output data in the 24 hours ending 12/17/18 1730  Labs/Imaging Results for orders placed or performed during the hospital encounter of 12/17/18 (from the past 48 hour(s))  Lipase, blood     Status: None   Collection Time: 12/17/18  2:39 PM  Result Value Ref Range   Lipase 24 11 - 51 U/L    Comment: Performed at [facility], [address] [place], [place] [address]  Comprehensive metabolic panel     Status: Abnormal   Collection Time: 12/17/18  2:39 PM  Result Value Ref Range   Sodium 141 135 - 145 mmol/L   Potassium 4.1 3.5 - 5.1 mmol/L    Comment: HEMOLYSIS AT THIS LEVEL MAY AFFECT RESULT   Chloride 109 98 - 111 mmol/L   CO2 23 22 - 32 mmol/L   Glucose, Bld 140 (H) 70 - 99 mg/dL   BUN 19 8 - 23 mg/dL   Creatinine, Ser 4.090.88 0.44 - 1.00 mg/dL    Calcium 8.7 (L) 8.9 - 10.3 mg/dL   Total Protein 6.4 (L) 6.5 - 8.1 g/dL   Albumin 3.7 3.5 - 5.0 g/dL   AST 24 15 - 41 U/L   ALT 5 0 - 44 U/L   Alkaline Phosphatase 92 38 - 126 U/L   Total Bilirubin 0.6 0.3 - 1.2 mg/dL   GFR calc non Af Amer 59 (L) >60 mL/min   GFR calc Af Amer >60 >60 mL/min   Anion gap 9 5 - 15    Comment: Performed at [facility], [address] [place], [place] [address]  CBC     Status: Abnormal   Collection Time: 12/17/18  2:39 PM  Result Value Ref Range   WBC 14.1 (H) 4.0 - 10.5 K/uL   RBC 5.09 3.87 - 5.11 MIL/uL   Hemoglobin 15.6 (H) 12.0 - 15.0 g/dL   HCT 47.846.5 (H) 29.536.0 - 62.146.0 %   MCV 91.4 80.0 - 100.0 fL   MCH 30.6 26.0 - 34.0 pg   MCHC 33.5 30.0 - 36.0 g/dL   RDW 30.813.8 65.711.5 - 84.615.5 %   Platelets 206 150 - 400 K/uL   nRBC 0.0 0.0 - 0.2 %    Comment: Performed at [facility], [address] [place], [place] [address]  Troponin I - Add-On to previous collection     Status: None   Collection Time: 12/17/18  2:39 PM  Result Value Ref Range   Troponin I <0.03 <0.03 ng/mL    Comment: Performed at [facility], [address] [place], [place] [address]  Urinalysis, Complete w Microscopic     Status: Abnormal   Collection Time: 12/17/18  4:18 PM  Result Value Ref Range   Color, Urine YELLOW (A) YELLOW   APPearance CLEAR (A) CLEAR   Specific Gravity, Urine 1.032 (H) 1.005 - 1.030   pH 6.0 5.0 - 8.0   Glucose, UA NEGATIVE NEGATIVE mg/dL   Hgb urine dipstick MODERATE (A) NEGATIVE   Bilirubin Urine NEGATIVE NEGATIVE   Ketones, ur NEGATIVE NEGATIVE mg/dL   Protein, ur NEGATIVE NEGATIVE mg/dL   Nitrite NEGATIVE NEGATIVE   Leukocytes,Ua NEGATIVE NEGATIVE   RBC / HPF 11-20 0 - 5 RBC/hpf   WBC, UA 0-5 0 - 5 WBC/hpf   Bacteria, UA NONE SEEN NONE SEEN   Squamous Epithelial / LPF 0-5 0 - 5    Comment: Performed at [facility], [address] [place], [place] [address]  SARS Coronavirus 2 (CEPHEID - Performed in [facility]), [facility] Order  Status: None   Collection Time: 12/17/18  4:18 PM  Result Value Ref Range   SARS Coronavirus 2 NEGATIVE NEGATIVE    Comment: (NOTE) If result is NEGATIVE SARS-CoV-2 target nucleic acids are NOT DETECTED. The SARS-CoV-2 RNA is generally detectable in upper and lower  respiratory specimens during the acute phase of infection. The lowest  concentration of SARS-CoV-2 viral copies this assay can detect is 250  copies / mL. A negative result does not preclude SARS-CoV-2 infection  and should not be used as the sole basis for treatment or other  patient management decisions.  A negative result may occur with  improper specimen collection / handling, submission of specimen other  than nasopharyngeal swab, presence of viral mutation(s) within the  areas targeted by this assay, and inadequate number of viral copies  (<250 copies / mL). A negative result must be combined with clinical  observations, patient history, and epidemiological information. If result is POSITIVE SARS-CoV-2 target nucleic acids are DETECTED. The SARS-CoV-2 RNA is generally detectable in upper and lower  respiratory specimens dur ing the acute phase of infection.  Positive  results are indicative of active infection with SARS-CoV-2.  Clinical  correlation with patient history and other diagnostic information is  necessary to determine patient infection status.  Positive results do  not rule out bacterial infection or co-infection with other viruses. If result is PRESUMPTIVE POSTIVE SARS-CoV-2 nucleic acids MAY BE PRESENT.   A presumptive positive result was obtained on the submitted specimen  and confirmed on repeat testing.  While 2019 novel coronavirus  (SARS-CoV-2) nucleic acids may be present in the submitted sample  additional confirmatory testing may be necessary for epidemiological  and / or clinical management purposes  to differentiate between  SARS-CoV-2 and other  Sarbecovirus currently known to infect humans.  If clinically indicated additional testing with an alternate test  methodology [phone]([id number]) is advised. The SARS-CoV-2 RNA is generally  detectable in upper and lower respiratory sp ecimens during the acute  phase of infection. The expected result is Negative. Fact Sheet for Patients:  BoilerBrush.com.cyhttps://www.fda.gov/media/136312/download Fact Sheet for Healthcare Providers: https://pope.com/https://www.fda.gov/media/136313/download This test is not yet approved or cleared by the [place] FDA and has been authorized for detection and/or diagnosis of SARS-CoV-2 by FDA under an Emergency Use Authorization (EUA).  This EUA will remain in effect (meaning this test can be used) for the duration of the COVID-19 declaration under Section 564(b)(1) of the Act, 21 U.S.C. section 360bbb-3(b)(1), unless the authorization is terminated or revoked sooner. Performed at [facility], [address] [place], [place] [address]    Dg Chest 1 View  Result Date: 12/17/2018 CLINICAL DATA:  Pain.  Dementia. EXAM: CHEST  1 VIEW COMPARISON:  January 04, 2016 FINDINGS: There is slight bibasilar atelectasis. There is no evident edema or consolidation. Heart size and pulmonary vascularity are normal. No adenopathy. No bone lesions. IMPRESSION: Mild bibasilar atelectasis. No edema or consolidation. Stable cardiac silhouette. Electronically Signed   By: Bretta BangWilliam  Woodruff III M.D.   On: 12/17/2018 16:08   Ct Abdomen Pelvis W Contrast  Result Date: 12/17/2018 CLINICAL DATA:  Abdominal pain, diarrhea EXAM: CT ABDOMEN AND PELVIS WITH CONTRAST TECHNIQUE: Multidetector CT imaging of the abdomen and pelvis was performed using the standard protocol following bolus administration of intravenous contrast. CONTRAST:  75mL OMNIPAQUE IOHEXOL 300 MG/ML  SOLN COMPARISON:  01/04/2016 FINDINGS: Lower chest: No acute abnormality.  Bibasilar scarring. Hepatobiliary: No focal liver abnormality is seen. Status  post cholecystectomy.  No biliary dilatation. Pancreas: Unremarkable. No pancreatic ductal dilatation or surrounding inflammatory changes. Spleen: Normal in size without significant abnormality. Adrenals/Urinary Tract: Adrenal glands are unremarkable. Kidneys are normal, without renal calculi, solid lesion, or hydronephrosis. Bladder is unremarkable. Stomach/Bowel: Stomach is within normal limits. There is severe sigmoid diverticulosis and inflammatory thickening about the proximal to mid sigmoid. Vascular/Lymphatic: Mixed calcific atherosclerosis. No enlarged abdominal or pelvic lymph nodes. Reproductive: Status post hysterectomy. Other: No abdominal wall hernia or abnormality. Trace free fluid in the low abdomen and pelvis. Musculoskeletal: No acute or significant osseous findings. IMPRESSION: 1. There is severe sigmoid diverticulosis and inflammatory thickening about the proximal to mid sigmoid. Findings are consistent with acute diverticulitis. No evidence of perforation or abscess. 2. Trace nonspecific free fluid in the low abdomen and pelvis, likely reactive. 3. Other chronic, incidental, and postoperative findings as detailed above. Electronically Signed   By: Lauralyn PrimesAlex  Bibbey M.D.   On: 12/17/2018 16:37    Pending Labs Unresulted Labs (From admission, onward)    Start     Ordered   12/17/18 1718  C difficile quick scan w PCR reflex  (C Difficile quick screen w PCR reflex panel)  Once, for 24 hours,   STAT     12/17/18 1717   12/17/18 1718  Gastrointestinal Panel by PCR , Stool  (Gastrointestinal Panel by PCR, Stool)  Once,   STAT     12/17/18 1718   Signed and Held  CBC  (enoxaparin (LOVENOX)    CrCl >/= 30 ml/min)  Once,   R    Comments:  Baseline for enoxaparin therapy IF NOT ALREADY DRAWN.  Notify MD if PLT < 100 K.    Signed and Held   Signed and Held  Creatinine, serum  (enoxaparin (LOVENOX)    CrCl >/= 30 ml/min)  Once,   R    Comments:  Baseline for enoxaparin therapy IF NOT ALREADY  DRAWN.    Signed and Held   Signed and Held  Creatinine, serum  (enoxaparin (LOVENOX)    CrCl >/= 30 ml/min)  Weekly,   R    Comments:  while on enoxaparin therapy    Signed and Held   Signed and Held  CBC  Tomorrow morning,   R     Signed and Held          Vitals/Pain Today's Vitals   12/17/18 1500 12/17/18 1530 12/17/18 1600 12/17/18 1630  BP: 132/77 (!) 137/93 (!) 153/71 123/64  Pulse: 67 70 76 75  Resp: 18 (!) 24 15 12   Temp:      TempSrc:      SpO2: 94% 97% 99% 100%  Weight:      Height:        Isolation Precautions Enteric precautions (UV disinfection)  Medications Medications  piperacillin-tazobactam (ZOSYN) IVPB 3.375 g (3.375 g Intravenous New Bag/Given 12/17/18 1701)  morphine 2 MG/ML injection 2 mg (2 mg Intravenous Given 12/17/18 1559)  ondansetron (ZOFRAN) injection 4 mg (4 mg Intravenous Given 12/17/18 1559)  iohexol (OMNIPAQUE) 300 MG/ML solution 75 mL (75 mLs Intravenous Contrast Given 12/17/18 1542)    Mobility walks with person assist Low fall risk   Focused Assessments GI: lower abdominal pain since last Friday with diarrhea      R Recommendations: See Admitting Provider Note  Report given to:   Additional Notes:

## 2018-12-17 NOTE — ED Provider Notes (Signed)
[facility] Emergency Department Provider Note  ____________________________________________  Time seen: Approximately 3:34 PM  I have reviewed the triage vital signs and the nursing notes.   HISTORY  Chief Complaint Abdominal Pain    HPI Nicoletta DressSarah Bialas is a 85 y.o. female with a history of hypertension, hyperlipidemia and GERD, presents to the emergency department with concern for abdominal pain and diarrhea for the past 2 to 3 days.  Patient has a history of mild dementia and it is difficult to elicit historical information.  Patient seems to be in pain on physical exam.  Patient is tearful and crying during exam. She will not respond to questions.     Patient's daughter, Boyd Kerbsenny was contacted.  Patient's daughter states that patient has had excruciating abdominal pain and diarrhea over the past 2 days.  She has no history of diverticulitis.  No fever or chills at home.  No other alleviating measures been attempted.    Past Medical History:  Diagnosis Date  . Dementia (HCC)   . Depression   . Fatigue   . Femur fracture, right (HCC)   . GERD (gastroesophageal reflux disease)   . Hyperlipidemia   . Hypertension   . Hypothyroidism   . Osteoarthritis   . Tremor    benign essential tremor- tremors of voice, head and jaw  . Weakness     Patient Active Problem List   Diagnosis Date Noted  . Diverticulitis 12/17/2018  . Encephalopathy acute 01/04/2016  . Hip fracture (HCC) 05/23/2015    Past Surgical History:  Procedure Laterality Date  . ABDOMINAL HYSTERECTOMY    . APPENDECTOMY    . CATARACT EXTRACTION W/PHACO Right 11/02/2017   Procedure: CATARACT EXTRACTION PHACO AND INTRAOCULAR LENS PLACEMENT (IOC);  Surgeon: Galen ManilaPorfilio, William, MD;  Location: [facility] ORS;  Service: Ophthalmology;  Laterality: Right;  US 00:37 AP% 15.0 CDE 5.58 Fluid pack lot # [id number]  . CATARACT EXTRACTION W/PHACO Left 12/07/2017   Procedure: CATARACT EXTRACTION PHACO AND  INTRAOCULAR LENS PLACEMENT (IOC);  Surgeon: Galen ManilaPorfilio, William, MD;  Location: [facility] ORS;  Service: Ophthalmology;  Laterality: Left;  US 00:34 AP% 14.2 CDE 4.88 Fluid pack lot # [id number]  . CHOLECYSTECTOMY    . THYROIDECTOMY, PARTIAL    . TOTAL HIP ARTHROPLASTY Right 05/24/2015   Procedure: TOTAL HIP ARTHROPLASTY ANTERIOR APPROACH;  Surgeon: Kennedy BuckerMichael Menz, MD;  Location: [facility] ORS;  Service: Orthopedics;  Laterality: Right;    Prior to Admission medications   Medication Sig Start Date End Date Taking? Authorizing Provider  atenolol (TENORMIN) 50 MG tablet Take 50 mg by mouth daily.   Yes [provider]  carbidopa-levodopa (SINEMET IR) 10-100 MG tablet Take 1 tablet by mouth 2 (two) times daily.   Yes [provider]  cholecalciferol (VITAMIN D3) 25 MCG (1000 UT) tablet Take 1,000 Units by mouth daily.   Yes [provider]  ketotifen (ZADITOR) 0.025 % ophthalmic solution Place 1 drop into both eyes daily.   Yes [provider]  levothyroxine (SYNTHROID, LEVOTHROID) 100 MCG tablet Take 100 mcg by mouth daily.   Yes [provider]  lisinopril (ZESTRIL) 5 MG tablet Take 7.5 mg by mouth daily.    Yes [provider]  liver oil-zinc oxide (DESITIN) 40 % ointment Apply 1 application topically as needed for irritation (after toileting).   Yes [provider]  loperamide (IMODIUM A-D) 2 MG tablet Take 2 mg by mouth 4 (four) times daily as needed for diarrhea or loose stools.   Yes  [provider]  loratadine (CLARITIN) 10 MG tablet Take 10 mg by mouth daily.   Yes [provider]  pravastatin (PRAVACHOL) 20 MG tablet Take 20 mg by mouth at bedtime.   Yes [provider]  primidone (MYSOLINE) 250 MG tablet Take 250 mg by mouth at bedtime.    Yes [provider]  primidone (MYSOLINE) 50 MG tablet Take 100 mg by mouth 2 (two) times daily.   Yes [provider]  trospium (SANCTURA) 20 MG tablet Take  20 mg by mouth daily.   Yes [provider]    Allergies Codeine  Family History  Problem Relation Age of Onset  . Diabetes Mellitus II Mother   . Other Father        Tremors    Social History Social History   Tobacco Use  . Smoking status: Never Smoker  . Smokeless tobacco: Current User    Types: Snuff  Substance Use Topics  . Alcohol use: No  . Drug use: No     Review of Systems  Gastrointestinal: Patient has abdominal pain. No nausea, no vomiting.  Patient has diarrhea.  No constipation. Musculoskeletal: Negative for musculoskeletal pain. Skin: Negative for rash, abrasions, lacerations, ecchymosis.   ____________________________________________   PHYSICAL EXAM:  VITAL SIGNS: ED Triage Vitals  Enc Vitals Group     BP 12/17/18 1439 (!) 141/65     Pulse Rate 12/17/18 1434 72     Resp 12/17/18 1434 16     Temp 12/17/18 1439 98.2 F (36.8 C)     Temp Source 12/17/18 1439 Oral     SpO2 12/17/18 1434 96 %     Weight 12/17/18 1434 127 lb 13.9 oz (58 kg)     Height 12/17/18 1434 5\' 6"  (1.676 m)     Head Circumference --      Peak Flow --      Pain Score --      Pain Loc --      Pain Edu? --      Excl. in GC? --      Constitutional: Alert and oriented. Well appearing and in no acute distress. Eyes: Conjunctivae are normal. PERRL. EOMI. Head: Atraumatic. ENT:      Nose: No congestion/rhinnorhea.      Mouth/Throat: Mucous membranes are moist.  Neck: No stridor.  No cervical spine tenderness to palpation.  Cardiovascular: Normal rate, regular rhythm. Normal S1 and S2.  Good peripheral circulation. Respiratory: Normal respiratory effort without tachypnea or retractions. Lungs CTAB. Good air entry to the bases with no decreased or absent breath sounds. Gastrointestinal: Bowel sounds 4 quadrants. Soft and diffusely tender to palpation. No guarding or rigidity. No palpable masses. Abdomen mildly distended.  Musculoskeletal: Full range of motion to all  extremities. No gross deformities appreciated. Skin:  Skin is warm, dry and intact. No rash noted.    ____________________________________________   LABS (all labs ordered are listed, but only abnormal results are displayed)  Labs Reviewed  COMPREHENSIVE METABOLIC PANEL - Abnormal; Notable for the following components:      Result Value   Glucose, Bld 140 (*)    Calcium 8.7 (*)    Total Protein 6.4 (*)    GFR calc non Af Amer 59 (*)    All other components within normal limits  CBC - Abnormal; Notable for the following components:   WBC 14.1 (*)    Hemoglobin 15.6 (*)    HCT 46.5 (*)    All other  components within normal limits  URINALYSIS, COMPLETE (UACMP) WITH MICROSCOPIC - Abnormal; Notable for the following components:   Color, Urine YELLOW (*)    APPearance CLEAR (*)    Specific Gravity, Urine 1.032 (*)    Hgb urine dipstick MODERATE (*)    All other components within normal limits  SARS CORONAVIRUS 2 (HOSPITAL ORDER, PERFORMED IN [facility])  C DIFFICILE QUICK SCREEN W PCR REFLEX  GASTROINTESTINAL PANEL BY PCR, STOOL (REPLACES STOOL CULTURE)  LIPASE, BLOOD  TROPONIN I   ____________________________________________  EKG   ____________________________________________  RADIOLOGY I personally viewed and evaluated these images as part of my medical decision making, as well as reviewing the written report by the radiologist.    Dg Chest 1 View  Result Date: 12/17/2018 CLINICAL DATA:  Pain.  Dementia. EXAM: CHEST  1 VIEW COMPARISON:  January 04, 2016 FINDINGS: There is slight bibasilar atelectasis. There is no evident edema or consolidation. Heart size and pulmonary vascularity are normal. No adenopathy. No bone lesions. IMPRESSION: Mild bibasilar atelectasis. No edema or consolidation. Stable cardiac silhouette. Electronically Signed   By: Lowella Grip III M.D.   On: 12/17/2018 16:08   Ct Abdomen Pelvis W Contrast  Result Date: 12/17/2018 CLINICAL  DATA:  Abdominal pain, diarrhea EXAM: CT ABDOMEN AND PELVIS WITH CONTRAST TECHNIQUE: Multidetector CT imaging of the abdomen and pelvis was performed using the standard protocol following bolus administration of intravenous contrast. CONTRAST:  3mL OMNIPAQUE IOHEXOL 300 MG/ML  SOLN COMPARISON:  01/04/2016 FINDINGS: Lower chest: No acute abnormality.  Bibasilar scarring. Hepatobiliary: No focal liver abnormality is seen. Status post cholecystectomy. No biliary dilatation. Pancreas: Unremarkable. No pancreatic ductal dilatation or surrounding inflammatory changes. Spleen: Normal in size without significant abnormality. Adrenals/Urinary Tract: Adrenal glands are unremarkable. Kidneys are normal, without renal calculi, solid lesion, or hydronephrosis. Bladder is unremarkable. Stomach/Bowel: Stomach is within normal limits. There is severe sigmoid diverticulosis and inflammatory thickening about the proximal to mid sigmoid. Vascular/Lymphatic: Mixed calcific atherosclerosis. No enlarged abdominal or pelvic lymph nodes. Reproductive: Status post hysterectomy. Other: No abdominal wall hernia or abnormality. Trace free fluid in the low abdomen and pelvis. Musculoskeletal: No acute or significant osseous findings. IMPRESSION: 1. There is severe sigmoid diverticulosis and inflammatory thickening about the proximal to mid sigmoid. Findings are consistent with acute diverticulitis. No evidence of perforation or abscess. 2. Trace nonspecific free fluid in the low abdomen and pelvis, likely reactive. 3. Other chronic, incidental, and postoperative findings as detailed above. Electronically Signed   By: Eddie Candle M.D.   On: 12/17/2018 16:37    ____________________________________________    PROCEDURES  Procedure(s) performed:    Procedures    Medications  morphine 2 MG/ML injection 2 mg (2 mg Intravenous Given 12/17/18 1559)  ondansetron (ZOFRAN) injection 4 mg (4 mg Intravenous Given 12/17/18 1559)  iohexol  (OMNIPAQUE) 300 MG/ML solution 75 mL (75 mLs Intravenous Contrast Given 12/17/18 1542)  piperacillin-tazobactam (ZOSYN) IVPB 3.375 g (0 g Intravenous Stopped 12/17/18 1732)     ____________________________________________   INITIAL IMPRESSION / ASSESSMENT AND PLAN / ED COURSE  Pertinent labs & imaging results that were available during my care of the patient were reviewed by me and considered in my medical decision making (see chart for details).  Review of the [place] CSRS was performed in accordance of the [place] prior to dispensing any controlled drugs.           Assessment and Plan: Abdominal Pain:  85 year old female presents to the emergency department  with diffuse abdominal pain and diarrhea for the past 2 days.  On physical exam, patient was afebrile with no tachycardia or tachypnea.  She was initially hypertensive.  Her abdomen was diffusely tender to palpation.  Differential diagnosis included diverticulitis, gastritis, gastroenteritis, abdominal abscess, small bowel obstruction...  CT abdomen and pelvis results were consistent with diverticulitis.  Patient had mild leukocytosis on CBC 14.1.  Patient received IV Zosyn in the emergency department.  Patient's case was discussed with attending hospitalist Dr.Salary.  Patient was accepted for admission.  Patient's daughter was called and she was briefed regarding her mother's care.  She had no questions and felt comfortable with her mother being admitted to the hospital.  ____________________________________________  FINAL CLINICAL IMPRESSION(S) / ED DIAGNOSES  Final diagnoses:  Diverticulitis      NEW MEDICATIONS STARTED DURING THIS VISIT:  ED Discharge Orders    None          This chart was dictated using voice recognition software/Dragon. Despite best efforts to proofread, errors can occur which can change the meaning. Any change was purely unintentional.    Orvil FeilWoods, Malajah Oceguera M, PA-C 12/17/18 1814    Sharman CheekStafford,  Phillip, MD 12/17/18 [phone]

## 2018-12-17 NOTE — ED Notes (Signed)
Pt had runny, medium sized BM. This RN and Katie, NT cleaned up and changed pt. Pt placed in brief.

## 2018-12-18 LAB — CBC
HCT: 43.8 % (ref 36.0–46.0)
Hemoglobin: 14.4 g/dL (ref 12.0–15.0)
MCH: 30.8 pg (ref 26.0–34.0)
MCHC: 32.9 g/dL (ref 30.0–36.0)
MCV: 93.8 fL (ref 80.0–100.0)
Platelets: 183 10*3/uL (ref 150–400)
RBC: 4.67 MIL/uL (ref 3.87–5.11)
RDW: 14.1 % (ref 11.5–15.5)
WBC: 10.4 10*3/uL (ref 4.0–10.5)
nRBC: 0 % (ref 0.0–0.2)

## 2018-12-18 MED ORDER — SODIUM CHLORIDE 0.9 % IV SOLN
INTRAVENOUS | Status: DC | PRN
  Administered 2018-12-18 – 2018-12-19 (×3): 250 mL via INTRAVENOUS
  Administered 2018-12-19: 10 mL via INTRAVENOUS

## 2018-12-18 NOTE — Progress Notes (Signed)
Advance care planning  Purpose of Encounter Diverticulitis and CODE STATUS discussion  Parties in Attendance Patient, healthcare power of attorney- daughter Brandy Oconnor over the phone as she is unable to come in with COVID restrictions  Patients Decisional capacity Patient with dementia and unable to make medical decisions  Discussed in detail regarding diverticulitis.  Treatment plan , prognosis discussed.  All questions answered  Discussed with daughter regarding CODE STATUS.  This time on admission patient had full code ordered.  Discussed with daughter regarding patient having DO NOT RESUSCITATE status in the past.  She tells me that she does not like not resuscitate but her mother had signed papers in the past saying DO NOT RESUSCITATE.  Daughter presently makes decisions.  She is a Merchandiser, retail and understands implications of full code versus DNR.  But wants to discuss with her mother further regarding CODE STATUS.  Request that we continue full code at this time.  I explained to her that she will have to go through CPR and ventilator if she has a cardiac arrest.  FULL CODE  Time spent - 17 minutes

## 2018-12-18 NOTE — Progress Notes (Signed)
Daughter called for update. Update given. Daughter stated patient use to have home health, but the company was not always reliable. Daughter was not able to name the company the patient had used in the past. The only resource the patient uses is [facility]. Daughter did mention that the patient has a hospital bed, walker and BSC at home.   Fuller Mandril, RN

## 2018-12-18 NOTE — Progress Notes (Signed)
   12/18/18 1330  Clinical Encounter Type  Visited With Patient  Visit Type Initial  Referral From Nurse  Consult/Referral To Chaplain  Spiritual Encounters  Spiritual Needs Emotional  Patient was resting upon arrival. Patient woke up to verbal stimuli. Brandy Oconnor introduced self and inquired about patient's desire for AD info. Patient declined. No further actions needed.

## 2018-12-18 NOTE — Progress Notes (Signed)
[facility] - [place] [facility]   PATIENT NAME: Brandy Oconnor    MR#:  [id number]  DATE OF BIRTH:  08/10/1930  SUBJECTIVE:  CHIEF COMPLAINT:   Chief Complaint  Patient presents with  . Abdominal Pain   Patient with dementia is a poor historian.  Denies any concerns.  Afebrile.  REVIEW OF SYSTEMS:    Review of Systems  Unable to perform ROS: Dementia    DRUG ALLERGIES:   Allergies  Allergen Reactions  . Codeine Rash    VITALS:  Blood pressure (!) 157/83, pulse 71, temperature 97.8 F (36.6 C), temperature source Oral, resp. rate 18, height 5\' 6"  (1.676 m), weight 58 kg, SpO2 100 %.  PHYSICAL EXAMINATION:   Physical Exam  GENERAL:  85 y.o.-year-old patient lying in the bed with no acute distress.  EYES: Pupils equal, round, reactive to light and accommodation. No scleral icterus. Extraocular muscles intact.  HEENT: Head atraumatic, normocephalic. Oropharynx and nasopharynx clear.  NECK:  Supple, no jugular venous distention. No thyroid enlargement, no tenderness.  LUNGS: Normal breath sounds bilaterally, no wheezing, rales, rhonchi. No use of accessory muscles of respiration.  CARDIOVASCULAR: S1, S2 normal. No murmurs, rubs, or gallops.  ABDOMEN: Soft, lower abdominal tenderness with deep palpation, nondistended. Bowel sounds present. No organomegaly or mass.  EXTREMITIES: No cyanosis, clubbing or edema b/l.    NEUROLOGIC: Cranial nerves II through XII are intact. No focal Motor or sensory deficits b/l.   PSYCHIATRIC: The patient is alert and oriented x 3.  SKIN: No obvious rash, lesion, or ulcer.   LABORATORY PANEL:   CBC Recent Labs  Lab 12/18/18 0417  WBC 10.4  HGB 14.4  HCT 43.8  PLT 183   ------------------------------------------------------------------------------------------------------------------ Chemistries  Recent Labs  Lab 12/17/18 1439  NA 141  K 4.1  CL 109  CO2 23  GLUCOSE 140*  BUN 19  CREATININE 0.88  CALCIUM  8.7*  AST 24  ALT 5  ALKPHOS 92  BILITOT 0.6   ------------------------------------------------------------------------------------------------------------------  Cardiac Enzymes Recent Labs  Lab 12/17/18 1439  TROPONINI <0.03   ------------------------------------------------------------------------------------------------------------------  RADIOLOGY:  Dg Chest 1 View  Result Date: 12/17/2018 CLINICAL DATA:  Pain.  Dementia. EXAM: CHEST  1 VIEW COMPARISON:  January 04, 2016 FINDINGS: There is slight bibasilar atelectasis. There is no evident edema or consolidation. Heart size and pulmonary vascularity are normal. No adenopathy. No bone lesions. IMPRESSION: Mild bibasilar atelectasis. No edema or consolidation. Stable cardiac silhouette. Electronically Signed   By: Bretta BangWilliam  Woodruff III M.D.   On: 12/17/2018 16:08   Ct Abdomen Pelvis W Contrast  Result Date: 12/17/2018 CLINICAL DATA:  Abdominal pain, diarrhea EXAM: CT ABDOMEN AND PELVIS WITH CONTRAST TECHNIQUE: Multidetector CT imaging of the abdomen and pelvis was performed using the standard protocol following bolus administration of intravenous contrast. CONTRAST:  75mL OMNIPAQUE IOHEXOL 300 MG/ML  SOLN COMPARISON:  01/04/2016 FINDINGS: Lower chest: No acute abnormality.  Bibasilar scarring. Hepatobiliary: No focal liver abnormality is seen. Status post cholecystectomy. No biliary dilatation. Pancreas: Unremarkable. No pancreatic ductal dilatation or surrounding inflammatory changes. Spleen: Normal in size without significant abnormality. Adrenals/Urinary Tract: Adrenal glands are unremarkable. Kidneys are normal, without renal calculi, solid lesion, or hydronephrosis. Bladder is unremarkable. Stomach/Bowel: Stomach is within normal limits. There is severe sigmoid diverticulosis and inflammatory thickening about the proximal to mid sigmoid. Vascular/Lymphatic: Mixed calcific atherosclerosis. No enlarged abdominal or pelvic lymph nodes.  Reproductive: Status post hysterectomy. Other: No abdominal wall hernia or abnormality. Trace free fluid  in the low abdomen and pelvis. Musculoskeletal: No acute or significant osseous findings. IMPRESSION: 1. There is severe sigmoid diverticulosis and inflammatory thickening about the proximal to mid sigmoid. Findings are consistent with acute diverticulitis. No evidence of perforation or abscess. 2. Trace nonspecific free fluid in the low abdomen and pelvis, likely reactive. 3. Other chronic, incidental, and postoperative findings as detailed above. Electronically Signed   By: Eddie Candle M.D.   On: 12/17/2018 16:37     ASSESSMENT AND PLAN:   *Acute sigmoid diverticulitis IV ciprofloxacin and Flagyl. Pain medications as needed No abscess or perforation on CT scan  *Chronic Parkinson's dementia Stable Continue Sinemet  *Chronic hypothyroidism, unspecified Continue Synthroid  *Chronic GERD PPI daily  *Chronic benign essential hypertension Stable Continue home regiment  *Chronic hyperlipidemia, unspecified Continue statin therapy  *DVT prophylaxis with Lovenox  All the records are reviewed and case discussed with Care Management/Social Worker. Management plans discussed with the patient, family and they are in agreement.  CODE STATUS: Full code  TOTAL TIME TAKING CARE OF THIS PATIENT: 35 minutes.   POSSIBLE D/C IN 1-2 DAYS, DEPENDING ON CLINICAL CONDITION.  Leia Alf Chamya Hunton M.D on 12/18/2018 at 12:42 PM  Between 7am to 6pm - Pager - [phone]  After 6pm go to [email or web address] - password [email or web address] [facility]  Office  [phone]  CC: Primary care physician; Patient, No Pcp Per  Note: This dictation was prepared with Dragon dictation along with smaller phrase technology. Any transcriptional errors that result from this process are unintentional.

## 2018-12-19 MED ORDER — CIPROFLOXACIN HCL 500 MG PO TABS: 500.0000 mg | ORAL_TABLET | Freq: Two times a day (BID) | ORAL | 0 refills | Status: AC

## 2018-12-19 MED ORDER — METRONIDAZOLE 500 MG PO TABS: 500.0000 mg | ORAL_TABLET | Freq: Three times a day (TID) | ORAL | 0 refills | Status: AC

## 2018-12-19 NOTE — Discharge Summary (Signed)
[employer] - [place] at [facility]   PATIENT NAME: Brandy Oconnor    MR#:  [id number]  DATE OF BIRTH:  1930-12-28  DATE OF ADMISSION:  12/17/2018 ADMITTING PHYSICIAN: Bertrum SolMontell D Salary, MD  DATE OF DISCHARGE: 12/16/2018  PRIMARY CARE PHYSICIAN: Patient, No Pcp Per     ADMISSION DIAGNOSIS:  Diverticulitis [K57.92]  DISCHARGE DIAGNOSIS:  Active Problems:   Diverticulitis   SECONDARY DIAGNOSIS:   Past Medical History:  Diagnosis Date  . Dementia (HCC)   . Depression   . Fatigue   . Femur fracture, right (HCC)   . GERD (gastroesophageal reflux disease)   . Hyperlipidemia   . Hypertension   . Hypothyroidism   . Osteoarthritis   . Tremor    benign essential tremor- tremors of voice, head and jaw  . Weakness     HOSPITAL COURSE:   85 year old female with a history of Parkinson's dementia and essential hypertension who presents to the emergency room due to abdominal pain.  1.  Acute sigmoid diverticulitis: CT scan did show diverticulitis.  She was treated with ciprofloxacin and Flagyl.  Her white blood cell count has improved.  She has no abdominal pain at discharge.  She is tolerating her diet well.  She will continue on ciprofloxacin and Flagyl for a total of 12 days of treatment.  If this reoccurs then she will need to see a Careers advisersurgeon. 2.  Parkinson's disease: She will continue cariDopa/levodopa and primidone   2.  Essential hypertension: Continue atenolol and lisinopril  3.  Hypothyroid: Continue Synthroid  4.  Hyperlipidemia: Continue statin    DISCHARGE CONDITIONS AND DIET:  Stable regular diet  CONSULTS OBTAINED:    DRUG ALLERGIES:   Allergies  Allergen Reactions  . Codeine Rash    DISCHARGE MEDICATIONS:   Allergies as of 12/18/2018      Reactions   Codeine Rash      Medication List    TAKE these medications   atenolol 50 MG tablet Commonly known as:  TENORMIN Take 50 mg by mouth daily.   carbidopa-levodopa 10-100 MG  tablet Commonly known as:  SINEMET IR Take 1 tablet by mouth 2 (two) times daily.   cholecalciferol 25 MCG (1000 UT) tablet Commonly known as:  VITAMIN D3 Take 1,000 Units by mouth daily.   ciprofloxacin 500 MG tablet Commonly known as:  Cipro Take 1 tablet (500 mg total) by mouth 2 (two) times daily for 9 days.   ketotifen 0.025 % ophthalmic solution Commonly known as:  ZADITOR Place 1 drop into both eyes daily.   levothyroxine 100 MCG tablet Commonly known as:  SYNTHROID Take 100 mcg by mouth daily.   lisinopril 5 MG tablet Commonly known as:  ZESTRIL Take 7.5 mg by mouth daily.   liver oil-zinc oxide 40 % ointment Commonly known as:  DESITIN Apply 1 application topically as needed for irritation (after toileting).   loperamide 2 MG tablet Commonly known as:  IMODIUM A-D Take 2 mg by mouth 4 (four) times daily as needed for diarrhea or loose stools.   loratadine 10 MG tablet Commonly known as:  CLARITIN Take 10 mg by mouth daily.   metroNIDAZOLE 500 MG tablet Commonly known as:  Flagyl Take 1 tablet (500 mg total) by mouth 3 (three) times daily for 9 days.   pravastatin 20 MG tablet Commonly known as:  PRAVACHOL Take 20 mg by mouth at bedtime.   primidone 250 MG tablet Commonly known as:  MYSOLINE Take 250 mg  by mouth at bedtime.   primidone 50 MG tablet Commonly known as:  MYSOLINE Take 100 mg by mouth 2 (two) times daily.   trospium 20 MG tablet Commonly known as:  SANCTURA Take 20 mg by mouth daily.         Today   CHIEF COMPLAINT:  No issues overnight no abdominal pain tolerating diet   VITAL SIGNS:  Blood pressure 138/76, pulse 74, temperature 98.3 F (36.8 C), temperature source Oral, resp. rate 18, height 5\' 6"  (1.676 m), weight 58 kg, SpO2 98 %.   REVIEW OF SYSTEMS:  Review of Systems  Unable to perform ROS: Dementia  Psychiatric/Behavioral: Positive for memory loss.     PHYSICAL EXAMINATION:  GENERAL:  85 y.o.-year-old  patient lying in the bed with no acute distress.  NECK:  Supple, no jugular venous distention. No thyroid enlargement, no tenderness.  LUNGS: Normal breath sounds bilaterally, no wheezing, rales,rhonchi  No use of accessory muscles of respiration.  CARDIOVASCULAR: S1, S2 normal. No murmurs, rubs, or gallops.  ABDOMEN: Soft, non-tender, non-distended. Bowel sounds present. No organomegaly or mass.  EXTREMITIES: No pedal edema, cyanosis, or clubbing.  PSYCHIATRIC: The patient is alert and oriented x name SKIN: No obvious rash, lesion, or ulcer.   DATA REVIEW:   CBC Recent Labs  Lab 12/18/18 0417  WBC 10.4  HGB 14.4  HCT 43.8  PLT 183    Chemistries  Recent Labs  Lab 12/17/18 1439  NA 141  K 4.1  CL 109  CO2 23  GLUCOSE 140*  BUN 19  CREATININE 0.88  CALCIUM 8.7*  AST 24  ALT 5  ALKPHOS 92  BILITOT 0.6    Cardiac Enzymes Recent Labs  Lab 12/17/18 1439  TROPONINI <0.03    Microbiology Results  @[email or web address] @  RADIOLOGY:  Dg Chest 1 View  Result Date: 12/17/2018 CLINICAL DATA:  Pain.  Dementia. EXAM: CHEST  1 VIEW COMPARISON:  January 04, 2016 FINDINGS: There is slight bibasilar atelectasis. There is no evident edema or consolidation. Heart size and pulmonary vascularity are normal. No adenopathy. No bone lesions. IMPRESSION: Mild bibasilar atelectasis. No edema or consolidation. Stable cardiac silhouette. Electronically Signed   By: Bretta BangWilliam  Woodruff III M.D.   On: 12/17/2018 16:08   Ct Abdomen Pelvis W Contrast  Result Date: 12/17/2018 CLINICAL DATA:  Abdominal pain, diarrhea EXAM: CT ABDOMEN AND PELVIS WITH CONTRAST TECHNIQUE: Multidetector CT imaging of the abdomen and pelvis was performed using the standard protocol following bolus administration of intravenous contrast. CONTRAST:  75mL OMNIPAQUE IOHEXOL 300 MG/ML  SOLN COMPARISON:  01/04/2016 FINDINGS: Lower chest: No acute abnormality.  Bibasilar scarring. Hepatobiliary: No focal liver abnormality is seen. Status  post cholecystectomy. No biliary dilatation. Pancreas: Unremarkable. No pancreatic ductal dilatation or surrounding inflammatory changes. Spleen: Normal in size without significant abnormality. Adrenals/Urinary Tract: Adrenal glands are unremarkable. Kidneys are normal, without renal calculi, solid lesion, or hydronephrosis. Bladder is unremarkable. Stomach/Bowel: Stomach is within normal limits. There is severe sigmoid diverticulosis and inflammatory thickening about the proximal to mid sigmoid. Vascular/Lymphatic: Mixed calcific atherosclerosis. No enlarged abdominal or pelvic lymph nodes. Reproductive: Status post hysterectomy. Other: No abdominal wall hernia or abnormality. Trace free fluid in the low abdomen and pelvis. Musculoskeletal: No acute or significant osseous findings. IMPRESSION: 1. There is severe sigmoid diverticulosis and inflammatory thickening about the proximal to mid sigmoid. Findings are consistent with acute diverticulitis. No evidence of perforation or abscess. 2. Trace nonspecific free fluid in the low abdomen and pelvis, likely reactive.  3. Other chronic, incidental, and postoperative findings as detailed above. Electronically Signed   By: Eddie Candle M.D.   On: 12/17/2018 16:37      Allergies as of 12/26/2018      Reactions   Codeine Rash      Medication List    TAKE these medications   atenolol 50 MG tablet Commonly known as:  TENORMIN Take 50 mg by mouth daily.   carbidopa-levodopa 10-100 MG tablet Commonly known as:  SINEMET IR Take 1 tablet by mouth 2 (two) times daily.   cholecalciferol 25 MCG (1000 UT) tablet Commonly known as:  VITAMIN D3 Take 1,000 Units by mouth daily.   ciprofloxacin 500 MG tablet Commonly known as:  Cipro Take 1 tablet (500 mg total) by mouth 2 (two) times daily for 9 days.   ketotifen 0.025 % ophthalmic solution Commonly known as:  ZADITOR Place 1 drop into both eyes daily.   levothyroxine 100 MCG tablet Commonly known as:   SYNTHROID Take 100 mcg by mouth daily.   lisinopril 5 MG tablet Commonly known as:  ZESTRIL Take 7.5 mg by mouth daily.   liver oil-zinc oxide 40 % ointment Commonly known as:  DESITIN Apply 1 application topically as needed for irritation (after toileting).   loperamide 2 MG tablet Commonly known as:  IMODIUM A-D Take 2 mg by mouth 4 (four) times daily as needed for diarrhea or loose stools.   loratadine 10 MG tablet Commonly known as:  CLARITIN Take 10 mg by mouth daily.   metroNIDAZOLE 500 MG tablet Commonly known as:  Flagyl Take 1 tablet (500 mg total) by mouth 3 (three) times daily for 9 days.   pravastatin 20 MG tablet Commonly known as:  PRAVACHOL Take 20 mg by mouth at bedtime.   primidone 250 MG tablet Commonly known as:  MYSOLINE Take 250 mg by mouth at bedtime.   primidone 50 MG tablet Commonly known as:  MYSOLINE Take 100 mg by mouth 2 (two) times daily.   trospium 20 MG tablet Commonly known as:  SANCTURA Take 20 mg by mouth daily.          Management plans discussed with the patient  Stable for discharge    CODE STATUS:     Code Status Orders  (From admission, onward)         Start     Ordered   12/17/18 1859  Full code  Continuous     12/17/18 1858        Code Status History    Date Active Date Inactive Code Status Order ID Comments User Context   01/04/2016 1341 01/07/2016 2054 DNR [id number]  Bettey Costa, MD ED   05/24/2015 1703 05/27/2015 1733 Full Code [id number]  Hessie Knows, MD Inpatient   05/23/2015 1954 05/24/2015 1703 Full Code [id number]  Gladstone Lighter, MD Inpatient      TOTAL TIME TAKING CARE OF THIS PATIENT: 38 minutes.    Note: This dictation was prepared with Dragon dictation along with smaller phrase technology. Any transcriptional errors that result from this process are unintentional.  Bettey Costa M.D on 12-26-2018 at 12:54 PM  Between 7am to 6pm - Pager - [phone] After 6pm go to [email or web address] -  password [email or web address] [place] [facility]  Office  [phone]  CC: Primary care physician; Patient, No Pcp Per

## 2018-12-19 NOTE — TOC Initial Note (Signed)
Transition of Care [facility]) - Initial/Assessment Note    Patient Details  Name: Brandy Oconnor MRN: [id number] Date of Birth: 1931/06/04  [facility] CM/SW Contact:    Beverly Sessions, RN Phone Number: 2019/01/09, 3:07 PM  Clinical Narrative:                 Patient admitted with diverticulitis.  Patient with hx of dementia.  Lives at home with daughter.  Daughter states she is with her 24/7. [facility].  Due to the corona virus patient has been unable to do to the day program.   PCP Rande Lawman.  [facility] provides all transportation.  Transported today at discharge.    Per daughter patient has a RW, hospital bed, and BSC in the home.   RNCM notified Lauren at [facility] of discharge.  She is to reach out to Dr Rolena Infante RN to notify of discharge, and arrange transportation.    Expected Discharge Plan: Home/Self Care([facility]) Barriers to Discharge: Barriers Resolved   Patient Goals and CMS Choice        Expected Discharge Plan and Services Expected Discharge Plan: Home/Self Care([facility])   Discharge Planning Services: CM Consult     Expected Discharge Date: Jan 09, 2019                                    Prior Living Arrangements/Services   Lives with:: Adult Children Patient language and need for interpreter reviewed:: Yes            Current home services: DME    Activities of Daily Living Home Assistive Devices/Equipment: Environmental consultant (specify type)(four wheels) ADL Screening (condition at time of admission) Patient's cognitive ability adequate to safely complete daily activities?: Yes Is the patient deaf or have difficulty hearing?: No Does the patient have difficulty seeing, even when wearing glasses/contacts?: No Does the patient have difficulty concentrating, remembering, or making decisions?: No Patient able to express need for assistance with ADLs?: Yes Does the patient have difficulty dressing or bathing?: Yes Independently performs ADLs?:  No Communication: Independent Dressing (OT): Needs assistance Is this a change from baseline?: Pre-admission baseline Grooming: Needs assistance Is this a change from baseline?: Pre-admission baseline Feeding: Independent Bathing: Needs assistance Is this a change from baseline?: Pre-admission baseline Toileting: Needs assistance Is this a change from baseline?: Pre-admission baseline In/Out Bed: Needs assistance Is this a change from baseline?: Pre-admission baseline Walks in Home: Needs assistance(walker) Is this a change from baseline?: Pre-admission baseline Does the patient have difficulty walking or climbing stairs?: Yes Weakness of Legs: Both Weakness of Arms/Hands: Both  Permission Sought/Granted                  Emotional Assessment              Admission diagnosis:  Diverticulitis [K57.92] Patient Active Problem List   Diagnosis Date Noted  . Diverticulitis 12/17/2018  . Encephalopathy acute 01/04/2016  . Hip fracture ([place]) 05/23/2015   PCP:  Patient, No Pcp Per Pharmacy:   [place], [place] - [address] [place] [address] Phone: [phone] Fax: [phone]     Social Determinants of Health (SDOH) Interventions    Readmission Risk Interventions No flowsheet data found.

## 2018-12-19 NOTE — Progress Notes (Signed)
   01-18-2019 0900  Clinical Encounter Type  Visited With Patient  Visit Type Follow-up  Referral From Chaplain  Consult/Referral To [place]  Chaplain visit patient and she was lying in chair. Chaplain introduced herself and patient was glad to see her. Chaplain asked patient was she interested in completing an AD. Chaplain educated patient and she declined. Chaplain asked patient was there anything she could do for her and patient said no. Chaplain wish patient well and patient said that prayer makes her happy and Chaplain offered to pray and patient was excited.

## 2019-01-11 ENCOUNTER — Emergency Department: Payer: Medicare (Managed Care)

## 2019-01-11 ENCOUNTER — Inpatient Hospital Stay
Admission: EM | Admit: 2019-01-11 | Discharge: 2019-01-14 | DRG: 392 | Disposition: A | Discharge status: Home / Self Care | Payer: Medicare (Managed Care) | Attending: Internal Medicine | Admitting: Internal Medicine

## 2019-01-11 ENCOUNTER — Other Ambulatory Visit: Payer: Self-pay

## 2019-01-11 DIAGNOSIS — E039 Hypothyroidism, unspecified: Secondary | ICD-10-CM | POA: Diagnosis present

## 2019-01-11 DIAGNOSIS — K219 Gastro-esophageal reflux disease without esophagitis: Secondary | ICD-10-CM | POA: Diagnosis present

## 2019-01-11 DIAGNOSIS — Z96641 Presence of right artificial hip joint: Secondary | ICD-10-CM | POA: Diagnosis present

## 2019-01-11 DIAGNOSIS — F17220 Nicotine dependence, chewing tobacco, uncomplicated: Secondary | ICD-10-CM | POA: Diagnosis present

## 2019-01-11 DIAGNOSIS — M810 Age-related osteoporosis without current pathological fracture: Secondary | ICD-10-CM | POA: Diagnosis present

## 2019-01-11 DIAGNOSIS — Z66 Do not resuscitate: Secondary | ICD-10-CM | POA: Diagnosis present

## 2019-01-11 DIAGNOSIS — I10 Essential (primary) hypertension: Secondary | ICD-10-CM | POA: Diagnosis present

## 2019-01-11 DIAGNOSIS — N390 Urinary tract infection, site not specified: Secondary | ICD-10-CM | POA: Diagnosis present

## 2019-01-11 DIAGNOSIS — K5732 Diverticulitis of large intestine without perforation or abscess without bleeding: Secondary | ICD-10-CM | POA: Diagnosis present

## 2019-01-11 DIAGNOSIS — F0280 Dementia in other diseases classified elsewhere without behavioral disturbance: Secondary | ICD-10-CM | POA: Diagnosis present

## 2019-01-11 DIAGNOSIS — K5792 Diverticulitis of intestine, part unspecified, without perforation or abscess without bleeding: Secondary | ICD-10-CM | POA: Diagnosis present

## 2019-01-11 DIAGNOSIS — R7989 Other specified abnormal findings of blood chemistry: Secondary | ICD-10-CM | POA: Diagnosis not present

## 2019-01-11 DIAGNOSIS — Z90710 Acquired absence of both cervix and uterus: Secondary | ICD-10-CM

## 2019-01-11 DIAGNOSIS — I248 Other forms of acute ischemic heart disease: Secondary | ICD-10-CM | POA: Diagnosis present

## 2019-01-11 DIAGNOSIS — E872 Acidosis, unspecified: Secondary | ICD-10-CM

## 2019-01-11 DIAGNOSIS — Z1159 Encounter for screening for other viral diseases: Secondary | ICD-10-CM

## 2019-01-11 DIAGNOSIS — Z79890 Hormone replacement therapy (postmenopausal): Secondary | ICD-10-CM

## 2019-01-11 DIAGNOSIS — Z79899 Other long term (current) drug therapy: Secondary | ICD-10-CM

## 2019-01-11 DIAGNOSIS — A419 Sepsis, unspecified organism: Secondary | ICD-10-CM | POA: Diagnosis not present

## 2019-01-11 DIAGNOSIS — E785 Hyperlipidemia, unspecified: Secondary | ICD-10-CM | POA: Diagnosis present

## 2019-01-11 DIAGNOSIS — R109 Unspecified abdominal pain: Secondary | ICD-10-CM | POA: Diagnosis present

## 2019-01-11 DIAGNOSIS — G250 Essential tremor: Secondary | ICD-10-CM | POA: Diagnosis present

## 2019-01-11 DIAGNOSIS — G20 Parkinson's disease: Secondary | ICD-10-CM | POA: Diagnosis present

## 2019-01-11 DIAGNOSIS — R1084 Generalized abdominal pain: Secondary | ICD-10-CM

## 2019-01-11 LAB — URINALYSIS, COMPLETE (UACMP) WITH MICROSCOPIC
Bacteria, UA: NONE SEEN
Bilirubin Urine: NEGATIVE
Glucose, UA: NEGATIVE mg/dL
Hgb urine dipstick: NEGATIVE
Ketones, ur: NEGATIVE mg/dL
Leukocytes,Ua: NEGATIVE
Nitrite: NEGATIVE
Protein, ur: 30 mg/dL — AB
Specific Gravity, Urine: 1.008 (ref 1.005–1.030)
pH: 7 (ref 5.0–8.0)

## 2019-01-11 LAB — CBC WITH DIFFERENTIAL/PLATELET
Abs Immature Granulocytes: 0.05 10*3/uL (ref 0.00–0.07)
Basophils Absolute: 0.1 10*3/uL (ref 0.0–0.1)
Basophils Relative: 1 %
Eosinophils Absolute: 0.3 10*3/uL (ref 0.0–0.5)
Eosinophils Relative: 3 %
HCT: 47.5 % — ABNORMAL HIGH (ref 36.0–46.0)
Hemoglobin: 15.5 g/dL — ABNORMAL HIGH (ref 12.0–15.0)
Immature Granulocytes: 1 %
Lymphocytes Relative: 23 %
Lymphs Abs: 2.4 10*3/uL (ref 0.7–4.0)
MCH: 30.9 pg (ref 26.0–34.0)
MCHC: 32.6 g/dL (ref 30.0–36.0)
MCV: 94.6 fL (ref 80.0–100.0)
Monocytes Absolute: 0.5 10*3/uL (ref 0.1–1.0)
Monocytes Relative: 4 %
Neutro Abs: 7.3 10*3/uL (ref 1.7–7.7)
Neutrophils Relative %: 68 %
Platelets: 268 10*3/uL (ref 150–400)
RBC: 5.02 MIL/uL (ref 3.87–5.11)
RDW: 13.8 % (ref 11.5–15.5)
WBC: 10.7 10*3/uL — ABNORMAL HIGH (ref 4.0–10.5)
nRBC: 0 % (ref 0.0–0.2)

## 2019-01-11 LAB — COMPREHENSIVE METABOLIC PANEL
ALT: 10 U/L (ref 0–44)
AST: 26 U/L (ref 15–41)
Albumin: 3.7 g/dL (ref 3.5–5.0)
Alkaline Phosphatase: 130 U/L — ABNORMAL HIGH (ref 38–126)
Anion gap: 11 (ref 5–15)
BUN: 17 mg/dL (ref 8–23)
CO2: 24 mmol/L (ref 22–32)
Calcium: 8.9 mg/dL (ref 8.9–10.3)
Chloride: 105 mmol/L (ref 98–111)
Creatinine, Ser: 1.12 mg/dL — ABNORMAL HIGH (ref 0.44–1.00)
GFR calc Af Amer: 52 mL/min — ABNORMAL LOW (ref >60)
GFR calc non Af Amer: 44 mL/min — ABNORMAL LOW (ref >60)
Glucose, Bld: 164 mg/dL — ABNORMAL HIGH (ref 70–99)
Potassium: 4.3 mmol/L (ref 3.5–5.1)
Sodium: 140 mmol/L (ref 135–145)
Total Bilirubin: 0.5 mg/dL (ref 0.3–1.2)
Total Protein: 6.2 g/dL — ABNORMAL LOW (ref 6.5–8.1)

## 2019-01-11 LAB — LIPASE, BLOOD: Lipase: 29 U/L (ref 11–51)

## 2019-01-11 LAB — SAMPLE TO BLOOD BANK

## 2019-01-11 LAB — PROTIME-INR
INR: 1 (ref 0.8–1.2)
Prothrombin Time: 12.8 seconds (ref 11.4–15.2)

## 2019-01-11 LAB — GLUCOSE, CAPILLARY: Glucose-Capillary: 157 mg/dL — ABNORMAL HIGH (ref 70–99)

## 2019-01-11 LAB — APTT: aPTT: 55 seconds — ABNORMAL HIGH (ref 24–36)

## 2019-01-11 LAB — TROPONIN I (HIGH SENSITIVITY)
Troponin I (High Sensitivity): 3 ng/L (ref <18)
Troponin I (High Sensitivity): 265 ng/L (ref <18)

## 2019-01-11 LAB — SARS CORONAVIRUS 2 BY RT PCR (HOSPITAL ORDER, PERFORMED IN ~~LOC~~ HOSPITAL LAB): SARS Coronavirus 2: NEGATIVE

## 2019-01-11 LAB — LACTIC ACID, PLASMA: Lactic Acid, Venous: 2.8 mmol/L (ref 0.5–1.9)

## 2019-01-11 MED ORDER — VITAMIN D 25 MCG (1000 UNIT) PO TABS
1000.0000 [IU] | ORAL_TABLET | Freq: Every day | ORAL | Status: DC
  Administered 2019-01-12 – 2019-01-14 (×3): 1000 [IU] via ORAL
  Filled 2019-01-11 (×3): qty 1

## 2019-01-11 MED ORDER — SODIUM CHLORIDE 0.9 % IV SOLN
2.0000 g | Freq: Once | INTRAVENOUS | Status: AC
  Administered 2019-01-11: 2 g via INTRAVENOUS
  Filled 2019-01-11: qty 2

## 2019-01-11 MED ORDER — LIDOCAINE 5 % EX PTCH
1.0000 | MEDICATED_PATCH | CUTANEOUS | Status: DC
  Administered 2019-01-12: 1 via TRANSDERMAL
  Filled 2019-01-11 (×3): qty 1

## 2019-01-11 MED ORDER — SODIUM CHLORIDE 0.9 % IV BOLUS (SEPSIS): 1000.0000 mL | Freq: Once | INTRAVENOUS | Status: DC

## 2019-01-11 MED ORDER — DARIFENACIN HYDROBROMIDE ER 7.5 MG PO TB24
7.5000 mg | ORAL_TABLET | Freq: Every day | ORAL | Status: DC
  Administered 2019-01-12 – 2019-01-14 (×3): 7.5 mg via ORAL
  Filled 2019-01-11 (×3): qty 1

## 2019-01-11 MED ORDER — ACETAMINOPHEN 650 MG RE SUPP: 650.0000 mg | Freq: Four times a day (QID) | RECTAL | Status: DC | PRN

## 2019-01-11 MED ORDER — PRAVASTATIN SODIUM 20 MG PO TABS
20.0000 mg | ORAL_TABLET | Freq: Every day | ORAL | Status: DC
  Administered 2019-01-12 – 2019-01-13 (×2): 20 mg via ORAL
  Filled 2019-01-11 (×2): qty 1

## 2019-01-11 MED ORDER — SODIUM CHLORIDE 0.9 % IV BOLUS
2000.0000 mL | Freq: Once | INTRAVENOUS | Status: AC
  Administered 2019-01-11: 2000 mL via INTRAVENOUS

## 2019-01-11 MED ORDER — IOHEXOL 350 MG/ML SOLN
75.0000 mL | Freq: Once | INTRAVENOUS | Status: AC | PRN
  Administered 2019-01-11: 75 mL via INTRAVENOUS

## 2019-01-11 MED ORDER — LORATADINE 10 MG PO TABS
10.0000 mg | ORAL_TABLET | Freq: Every day | ORAL | Status: DC
  Administered 2019-01-12 – 2019-01-14 (×3): 10 mg via ORAL
  Filled 2019-01-11 (×3): qty 1

## 2019-01-11 MED ORDER — PRIMIDONE 50 MG PO TABS
100.0000 mg | ORAL_TABLET | ORAL | Status: DC
  Administered 2019-01-12 – 2019-01-14 (×3): 100 mg via ORAL
  Filled 2019-01-11 (×3): qty 2

## 2019-01-11 MED ORDER — CARBIDOPA-LEVODOPA 10-100 MG PO TABS
1.0000 | ORAL_TABLET | Freq: Two times a day (BID) | ORAL | Status: DC
  Administered 2019-01-12 – 2019-01-14 (×6): 1 via ORAL
  Filled 2019-01-11 (×7): qty 1

## 2019-01-11 MED ORDER — SODIUM CHLORIDE 0.9 % IV SOLN
INTRAVENOUS | Status: DC
  Administered 2019-01-12 – 2019-01-14 (×7): via INTRAVENOUS

## 2019-01-11 MED ORDER — HEPARIN BOLUS VIA INFUSION
3500.0000 [IU] | Freq: Once | INTRAVENOUS | Status: AC
  Administered 2019-01-11: 3500 [IU] via INTRAVENOUS
  Filled 2019-01-11: qty 3500

## 2019-01-11 MED ORDER — ACETAMINOPHEN 325 MG PO TABS: 650.0000 mg | ORAL_TABLET | Freq: Four times a day (QID) | ORAL | Status: DC | PRN

## 2019-01-11 MED ORDER — SODIUM CHLORIDE 0.9 % IV BOLUS
1000.0000 mL | Freq: Once | INTRAVENOUS | Status: AC
  Administered 2019-01-11: 1000 mL via INTRAVENOUS

## 2019-01-11 MED ORDER — ONDANSETRON HCL 4 MG PO TABS: 4.0000 mg | ORAL_TABLET | Freq: Four times a day (QID) | ORAL | Status: DC | PRN

## 2019-01-11 MED ORDER — HEPARIN (PORCINE) 25000 UT/250ML-% IV SOLN
700.0000 [IU]/h | INTRAVENOUS | Status: DC
  Administered 2019-01-11: 700 [IU]/h via INTRAVENOUS
  Filled 2019-01-11: qty 250

## 2019-01-11 MED ORDER — PRIMIDONE 250 MG PO TABS
250.0000 mg | ORAL_TABLET | Freq: Every day | ORAL | Status: DC
  Administered 2019-01-12 – 2019-01-13 (×3): 250 mg via ORAL
  Filled 2019-01-11 (×4): qty 1

## 2019-01-11 MED ORDER — PIPERACILLIN-TAZOBACTAM 3.375 G IVPB
3.3750 g | Freq: Three times a day (TID) | INTRAVENOUS | Status: DC
  Administered 2019-01-12 – 2019-01-14 (×8): 3.375 g via INTRAVENOUS
  Filled 2019-01-11 (×8): qty 50

## 2019-01-11 MED ORDER — PRIMIDONE 50 MG PO TABS: 50.0000 mg | ORAL_TABLET | Freq: Two times a day (BID) | ORAL | Status: DC

## 2019-01-11 MED ORDER — METRONIDAZOLE IN NACL 5-0.79 MG/ML-% IV SOLN
500.0000 mg | Freq: Once | INTRAVENOUS | Status: AC
  Administered 2019-01-11: 14:00:00 500 mg via INTRAVENOUS
  Filled 2019-01-11: qty 100

## 2019-01-11 MED ORDER — LEVOTHYROXINE SODIUM 100 MCG PO TABS
100.0000 ug | ORAL_TABLET | Freq: Every day | ORAL | Status: DC
  Administered 2019-01-12 – 2019-01-14 (×3): 100 ug via ORAL
  Filled 2019-01-11 (×3): qty 1

## 2019-01-11 MED ORDER — KETOTIFEN FUMARATE 0.025 % OP SOLN
1.0000 [drp] | Freq: Every day | OPHTHALMIC | Status: DC
  Administered 2019-01-12 – 2019-01-14 (×3): 1 [drp] via OPHTHALMIC
  Filled 2019-01-11: qty 5

## 2019-01-11 MED ORDER — ONDANSETRON HCL 4 MG/2ML IJ SOLN: 4.0000 mg | Freq: Four times a day (QID) | INTRAMUSCULAR | Status: DC | PRN

## 2019-01-11 MED ORDER — PRIMIDONE 50 MG PO TABS
50.0000 mg | ORAL_TABLET | Freq: Every day | ORAL | Status: DC
  Administered 2019-01-12 – 2019-01-14 (×3): 50 mg via ORAL
  Filled 2019-01-11 (×4): qty 1

## 2019-01-11 NOTE — ED Notes (Addendum)
Stuck X 2 for second blood culture unsuccessful except for one blue tube.

## 2019-01-11 NOTE — ED Notes (Signed)
EDP Quentin Cornwall notified in person of elevated trop 265.

## 2019-01-11 NOTE — ED Notes (Signed)
PCP of patient called this RN, left phone number of [phone] and states that the patient has been followed up with since hospital visit and can continue to follow patient's care outside of hospital.

## 2019-01-11 NOTE — ED Notes (Signed)
Date and time results received: 01/11/19 3:16 PM    Test: lactic acid Critical Value: 2.8  Name of Provider Notified: Dr. Ellender Hose  Orders Received? Or Actions Taken?:

## 2019-01-11 NOTE — Death Summary Note (Signed)
The patient has been discharged. Eduction was provided to the patient and the the daughter Arbie Cookey.

## 2019-01-11 NOTE — H&P (Signed)
[employer] [facility] [place] [facility]    PATIENT NAME: Brandy Oconnor    MR#:  [id number]  DATE OF BIRTH:  Jul 16, 1930  DATE OF ADMISSION:  01/11/2019  PRIMARY CARE PHYSICIAN:  [facility]  REQUESTING/REFERRING PHYSICIAN: Dr. Willy Eddy  CHIEF COMPLAINT:   Chief Complaint  Patient presents with  . Abdominal Pain    HISTORY OF PRESENT ILLNESS:  Brandy Oconnor  is a 85 y.o. female with a known history of Parkinson's disease, hypertension, hyperlipidemia, hypothyroidism, osteoarthritis, essential tremor who presents to the hospital due to abdominal pain.  Patient was recently in the hospital about 3 weeks ago due to acute diverticulitis treated and discharged on oral antibiotics and was doing well until this past few days she has developed worsening abdominal pain and therefore returned to the emergency room.  In the ER initially patient was noted to be hypotensive and underwent a CT scan of the abdomen pelvis which showed colitis/diverticulitis was also some perinephric stranding.  Patient did not have any abscess or any fluid collection or need to be drained.  Hospitalist services were contacted for admission for treatment of recurrent diverticulitis having failed outpatient oral antibiotic therapy.  Incidentally patient also noted to have a elevated high-sensitivity troponin, although patient denies any chest pains shortness of breath or any other associated symptoms.  Her EKG shows no acute changes.  Patient's COVID-19 test is still pending.  PAST MEDICAL HISTORY:   Past Medical History:  Diagnosis Date  . Dementia (HCC)   . Depression   . Fatigue   . Femur fracture, right (HCC)   . GERD (gastroesophageal reflux disease)   . Hyperlipidemia   . Hypertension   . Hypothyroidism   . Osteoarthritis   . Tremor    benign essential tremor- tremors of voice, head and jaw  . Weakness     PAST SURGICAL HISTORY:   Past Surgical History:  Procedure Laterality Date  .  ABDOMINAL HYSTERECTOMY    . APPENDECTOMY    . CATARACT EXTRACTION W/PHACO Right 11/02/2017   Procedure: CATARACT EXTRACTION PHACO AND INTRAOCULAR LENS PLACEMENT (IOC);  Surgeon: Galen Manila, MD;  Location: [facility] ORS;  Service: Ophthalmology;  Laterality: Right;  [place] 00:37 AP% 15.0 CDE 5.58 Fluid pack lot # [id number]  . CATARACT EXTRACTION W/PHACO Left 12/07/2017   Procedure: CATARACT EXTRACTION PHACO AND INTRAOCULAR LENS PLACEMENT (IOC);  Surgeon: Galen Manila, MD;  Location: [facility] ORS;  Service: Ophthalmology;  Laterality: Left;  [place] 00:34 AP% 14.2 CDE 4.88 Fluid pack lot # [id number]  . CHOLECYSTECTOMY    . THYROIDECTOMY, PARTIAL    . TOTAL HIP ARTHROPLASTY Right 05/24/2015   Procedure: TOTAL HIP ARTHROPLASTY ANTERIOR APPROACH;  Surgeon: Kennedy Bucker, MD;  Location: [facility] ORS;  Service: Orthopedics;  Laterality: Right;    SOCIAL HISTORY:   Social History   Tobacco Use  . Smoking status: Never Smoker  . Smokeless tobacco: Current User    Types: Snuff  Substance Use Topics  . Alcohol use: No    FAMILY HISTORY:   Family History  Problem Relation Age of Onset  . Diabetes Mellitus II Mother   . Other Father        Tremors    DRUG ALLERGIES:   Allergies  Allergen Reactions  . Codeine Rash    REVIEW OF SYSTEMS:   Review of Systems  Constitutional: Negative for fever and weight loss.  HENT: Negative for congestion, nosebleeds and tinnitus.   Eyes: Negative for blurred vision, double vision and  redness.  Respiratory: Negative for cough, hemoptysis and shortness of breath.   Cardiovascular: Negative for chest pain, orthopnea, leg swelling and PND.  Gastrointestinal: Positive for abdominal pain. Negative for diarrhea, melena, nausea and vomiting.  Genitourinary: Negative for dysuria, hematuria and urgency.  Musculoskeletal: Negative for falls and joint pain.  Neurological: Negative for dizziness, tingling, sensory change, focal weakness, seizures, weakness and  headaches.  Endo/Heme/Allergies: Negative for polydipsia. Does not bruise/bleed easily.  Psychiatric/Behavioral: Negative for depression and memory loss. The patient is not nervous/anxious.     MEDICATIONS AT HOME:   Prior to Admission medications   Medication Sig Start Date End Date Taking? Authorizing Provider  acetaminophen (TYLENOL) 500 MG tablet Take 500-1,000 mg by mouth every 6 (six) hours as needed.   Yes [provider]  atenolol (TENORMIN) 50 MG tablet Take 50 mg by mouth daily.   Yes [provider]  carbidopa-levodopa (SINEMET IR) 10-100 MG tablet Take 1 tablet by mouth 2 (two) times daily.   Yes [provider]  cholecalciferol (VITAMIN D3) 25 MCG (1000 UT) tablet Take 1,000 Units by mouth daily.   Yes [provider]  ketotifen (ZADITOR) 0.025 % ophthalmic solution Place 1 drop into both eyes daily.   Yes [provider]  levothyroxine (SYNTHROID, LEVOTHROID) 100 MCG tablet Take 100 mcg by mouth daily.   Yes [provider]  lidocaine (LIDODERM) 5 % Place 1 patch onto the skin daily. Remove & Discard patch within 12 hours or as directed by MD   Yes [provider]  lisinopril (ZESTRIL) 5 MG tablet Take 7.5 mg by mouth daily.    Yes [provider]  loratadine (CLARITIN) 10 MG tablet Take 10 mg by mouth daily.   Yes [provider]  mometasone (NASONEX) 50 MCG/ACT nasal spray Place 2 sprays into the nose daily.   Yes [provider]  Nutritional Supplements (ENSURE PLUS PO) Take 237 mLs by mouth 2 (two) times a day.   Yes [provider]  pravastatin (PRAVACHOL) 20 MG tablet Take 20 mg by mouth at bedtime.   Yes [provider]  primidone (MYSOLINE) 250 MG tablet Take 250 mg by mouth at bedtime.    Yes [provider]  primidone (MYSOLINE) 50 MG tablet Take 50-100 mg by mouth 2 (two) times a day. Give 100 mg in the morning, and 50 mg at noon. Give noon dose at pace  on center days   Yes [provider]  trospium (SANCTURA) 20 MG tablet Take 20 mg by mouth daily.   Yes [provider]      VITAL SIGNS:  Blood pressure (!) 106/35, pulse 85, temperature (!) 97.5 F (36.4 C), temperature source Oral, resp. rate 17, height 5\' 5"  (1.651 m), weight 61.2 kg, SpO2 95 %.  PHYSICAL EXAMINATION:  Physical Exam  GENERAL:  84 y.o.-year-old patient lying in the bed in no acute distress.  EYES: Pupils equal, round, reactive to light and accommodation. No scleral icterus. Extraocular muscles intact.  HEENT: Head atraumatic, normocephalic. Oropharynx and nasopharynx clear. No oropharyngeal erythema, moist oral mucosa  NECK:  Supple, no jugular venous distention. No thyroid enlargement, no tenderness.  LUNGS: Normal breath sounds bilaterally, no wheezing, rales, rhonchi. No use of accessory muscles of respiration.  CARDIOVASCULAR: S1, S2 RRR. No murmurs, rubs, gallops, clicks.  ABDOMEN: Soft, Tender in LLQ but no rebound, rigidity,  nondistended. Bowel sounds present. No organomegaly or mass.  EXTREMITIES: No pedal edema, cyanosis, or clubbing. +  2 pedal & radial pulses b/l.   NEUROLOGIC: Cranial nerves II through XII are intact. No focal Motor or sensory deficits appreciated b/l. Globally weak and pt. Is has an essential Tremor.  PSYCHIATRIC: The patient is alert and oriented x 3. SKIN: No obvious rash, lesion, or ulcer.   LABORATORY PANEL:   CBC Recent Labs  Lab 01/11/19 1321  WBC 10.7*  HGB 15.5*  HCT 47.5*  PLT 268   ------------------------------------------------------------------------------------------------------------------  Chemistries  Recent Labs  Lab 01/11/19 1321  NA 140  K 4.3  CL 105  CO2 24  GLUCOSE 164*  BUN 17  CREATININE 1.12*  CALCIUM 8.9  AST 26  ALT 10  ALKPHOS 130*  BILITOT 0.5   ------------------------------------------------------------------------------------------------------------------   Cardiac Enzymes No results for input(s): TROPONINI in the last 168 hours. ------------------------------------------------------------------------------------------------------------------  RADIOLOGY:  Dg Chest Port 1 View  Result Date: 01/11/2019 CLINICAL DATA:  Sepsis. EXAM: PORTABLE CHEST 1 VIEW COMPARISON:  December 17, 2018 FINDINGS: The heart size is stable. There are persistent airspace opacities at the lung bases bilaterally, left worse than right. These are favored to represent chronic atelectasis or scarring. There is no pneumothorax. No large pleural effusion. No acute osseous abnormality. IMPRESSION: 1. No acute cardiopulmonary process. 2. Persistent bibasilar airspace opacities favored to represent atelectasis. A developing infiltrate is difficult to exclude. Electronically Signed   By: Katherine Mantlehristopher  Green M.D.   On: 01/11/2019 14:13   Ct Angio Abd/pel W And/or Wo Contrast  Result Date: 01/11/2019 CLINICAL DATA:  Lower abdominal pain. Evaluate for mesenteric ischemia. EXAM: CTA ABDOMEN AND PELVIS WITH CONTRAST TECHNIQUE: Multidetector CT imaging of the abdomen and pelvis was performed using the standard protocol during bolus administration of intravenous contrast. Multiplanar reconstructed images and MIPs were obtained and reviewed to evaluate the vascular anatomy. CONTRAST:  75mL OMNIPAQUE IOHEXOL 350 MG/ML SOLN COMPARISON:  CT abdomen and pelvis-12/17/2018 FINDINGS: VASCULAR Aorta: There is a moderate to large amount of eccentric irregular mixed calcified and noncalcified atherosclerotic plaque throughout the normal caliber abdominal aorta, not resulting in a hemodynamically significant stenosis. No evidence of abdominal aortic dissection or periaortic stranding on this nongated examination. Celiac: There is a minimal amount of eccentric mixed calcified and noncalcified atherosclerotic plaque involving the origin the celiac artery, not resulting in a hemodynamically significant stenosis.  Conventional branching pattern. SMA: Widely patent without hemodynamically significant stenosis. Conventional branching pattern. The distal tributaries of the SMA appear widely patent without discrete intraluminal filling defect to suggest distal embolism with special attention paid to the vascular distribution supplying the descending colon. Renals: Solitary bilaterally. There is a minimal amount of eccentric mixed calcified and noncalcified atherosclerotic plaque involving the cranial aspect of the origin of the right renal artery, not definitely resulting in hemodynamically significant stenosis. No vessel irregularity to suggest FMD. IMA: Diseased at its origin though remains widely patent with early collateral supply from the SMA. Inflow: There is a moderate amount of eccentric mixed calcified and noncalcified affecting the bilateral common iliac arteries, not definitely resulting in hemodynamically significant stenosis. The bilateral internal iliac arteries are disease though patent of normal caliber. The bilateral external iliac arteries are widely patent without hemodynamically significant stenosis. Proximal Outflow: There is a moderate amount of eccentric mixed calcified and noncalcified atherosclerotic plaque involving the bilateral common femoral arteries, not definitely resulting in hemodynamically significant stenosis. The imaged aspects of the bilateral deep and superficial femoral arteries are widely patent without hemodynamically significant stenosis. Veins: The IVC and pelvic venous system appear widely  patent. Review of the MIP images confirms the above findings. _________________________________________________________ NON-VASCULAR Lower chest: Limited visualization of the lower thorax demonstrates minimal bibasilar ground-glass opacities, favored to represent atelectasis. No discrete focal airspace opacities. No pleural effusion. Borderline cardiomegaly. Trace amount of pericardial fluid,  presumably physiologic. Hepatobiliary: Normal hepatic contour. There is a wedge shaped perfusional abnormality involving the subcapsular aspect of the right lobe of the liver on the arterial phase images (image 47, series 4), without definitive correlate on the acquired portal venous phase images (image 52, series 8), and without correlate on recent CT scan of the abdomen pelvis performed 12/17/2018 favored to be artifactual due to phase of enhancement. No discrete hepatic lesions. The hepatic portal veins appear widely patent. Post cholecystectomy. The common bile duct is mildly dilated with mild centralized intrahepatic biliary duct dilatation, unchanged, and favored to be sequela of post cholecystectomy state and biliary reservoir phenomena. No ascites. Pancreas: Normal appearance of the pancreas. Spleen: Normal appearance of the spleen. Note is made of a small splenule. Adrenals/Urinary Tract: There is symmetric enhancement of the bilateral kidneys. Unchanged bilateral punctate hypoattenuating renal lesions are too small to accurately characterize though favored to represent renal cysts. No definite renal stones this postcontrast examination. No evidence of urinary obstruction, however there is hyperemia involving the bilateral renal pelves and bilateral ureters. No definitive cortical mottled enhancement to suggest pyelonephritis. While there is no evidence of urinary obstruction, the amount symmetric bilateral perinephric stranding has progressed compared to the 12/17/2018 examination. There is mild thickening of the bilateral adrenal glands without discrete nodule. Note is again made of a tiny (approximately 1.9 x 1.8 cm) diverticulum arising from the left side of the mid body of the urinary bladder (image 189, series 8). Stomach/Bowel: Redemonstrated extensive diverticulosis, primarily involving the sigmoid colon within the left lower abdomen/pelvis. There is nonspecific ill-defined stranding surrounding  about the descending colon within the left pericolic gutter (representative images 84, 99, 125 and 137, series 8), progressed compared to the 12/17/2018 examination. No evidence of perforation or definable/drainable fluid collection. Moderate-sized hiatal hernia. Large colonic stool burden without definitive evidence of enteric obstruction. Normal appearance of the terminal ileum. The appendix is not visualized compatible provided operative history. No pneumoperitoneum, pneumatosis or portal venous gas. Lymphatic: No bulky retroperitoneal mesenteric, pelvic or inguinal lymphadenopathy. Reproductive: Post hysterectomy. No discrete adnexal lesion. No free fluid the pelvic cul-de-sac. Other: Minimal amount of subcutaneous edema about the midline of the low back. Musculoskeletal: No acute or aggressive osseous abnormalities. Moderate to severe multilevel lumbar spine DDD, worse at L3-L4 with disc space height loss, endplate irregularity and sclerosis. Post right total hip replacement, incompletely evaluated but without evidence of hardware failure or loosening. IMPRESSION: VASCULAR 1. Moderate to large amount of atherosclerotic plaque within normal caliber abdominal aorta, not resulting in a hemodynamically significant stenosis. Aortic Atherosclerosis (ICD10-I70.0). 2. No CT evidence of mesenteric ischemia. NON-VASCULAR 1. Extensive diverticulosis involving the sigmoid colon within the left lower abdomen/pelvis. 2. Nonspecific rather diffuse stranding about the descending colon without evidence of enteric obstruction, perforation or definable/drainable fluid collection - findings are nonspecific though could be seen in the setting of a colitis, though conceivably uncomplicated diverticulitis could have a similar appearance. Clinical correlation is advised. 3. Hyperemia about the bilateral renal pelves and ureters with associated worsening bilateral perinephric stranding but without evidence of urinary obstruction -  findings are nonspecific though could be seen in the setting of a urinary tract infection. Currently, there is no CT evidence of pyelonephritis.  Correlation with urinalysis is advised. 4. Moderate-sized hiatal hernia. Critical Value/emergent results were called by telephone at the time of interpretation on 01/11/2019 at 5:20 pm to Dr. Roxan Hockeyobinson who verbally acknowledged these results. Electronically Signed   By: Simonne ComeJohn  Watts M.D.   On: 01/11/2019 17:32     IMPRESSION AND PLAN:    85 y.o. female with a known history of Parkinson's disease, hypertension, hyperlipidemia, hypothyroidism, osteoarthritis, essential tremor who presents to the hospital due to abdominal pain.   1.  Acute diverticulitis-this is the cause of patient's worsening abdominal pain.  Patient underwent a CT scan of the abdomen pelvis which was suggestive of diverticulitis but she also had perinephric stranding although her urinalysis is negative.  There is no acute fluid collection or phlegmon. - This is recurrent diverticulitis this patient was just in the hospital 3 weeks ago. - We will treat the patient empirically with IV Zosyn, follow clinically. -I will get a surgical consult.  Keep her n.p.o., give her IV fluids, antiemetics, pain control.  2.  Elevated troponin-patient instantly noted to have elevated high sensitive troponin.  Patient has no acute chest pain.  EKG shows no acute ST or T wave changes. - There was some concern for mesenteric ischemia but patient's CT angiogram of the abdomen shows no evidence of mesenteric ischemia.  This is likely not the cause of patient's troponin leak. - We will cycle her troponins, keep on telemetry, an echocardiogram.  I will also consult cardiology. -Patient has been empirically started on heparin in the ER.  3.  Essential hypertension-patient's blood pressures on the lower side therefore will hold antihypertensives for now.  4.  Hypothyroidism-continue Synthroid.  5.  Parkinson's  disease/essential tremor-continue Sinemet, primidone.  6.  Osteoporosis-continue vitamin D supplements.  Discussed plan of care with patient's daughter over the phone.  All the records are reviewed and case discussed with ED provider. Management plans discussed with the patient, family and they are in agreement.  CODE STATUS: DNR  TOTAL TIME TAKING CARE OF THIS PATIENT: 45 minutes.    Houston SirenVivek J Dae Antonucci M.D on 01/11/2019 at 6:29 PM  Between 7am to 6pm - Pager - [phone]  After 6pm go to [email or web address] - password [email or web address]  Fabio Neighbors[facility] [place] [facility]  Office  [phone]  CC: Primary care physician; Patient, No Pcp Per

## 2019-01-11 NOTE — ED Notes (Signed)
Pt sleeping. 

## 2019-01-11 NOTE — ED Provider Notes (Signed)
[facility] Emergency Department Provider Note  ____________________________________________   First MD Initiated Contact with Patient 01/11/19 1541     (approximate)  I have reviewed the triage vital signs and the nursing notes.   HISTORY  Chief Complaint Abdominal Pain    HPI Brandy Oconnor is a 85 y.o. female  With h/o dementia, GERD, HTN here with abd pain. History somewhat limited on arrival 2/2 dementia, distress. Pt states she had acute onset of severe, generalized abd pain this morning, along with associated nausea. No vomiting or diarrhea. She's had persistent and worsening pain all day along with generalized weakness. Unable to recall if she ate anything new last night. Denies any known fever or chills.  Of note, however, pt also does not recall recent admission for diverticulitis. Attempted to contact family on arrival.  Level 5 caveat invoked as remainder of history, ROS, and physical exam limited due to patient's dementia.         Past Medical History:  Diagnosis Date   Dementia (HCC)    Depression    Fatigue    Femur fracture, right (HCC)    GERD (gastroesophageal reflux disease)    Hyperlipidemia    Hypertension    Hypothyroidism    Osteoarthritis    Tremor    benign essential tremor- tremors of voice, head and jaw   Weakness     Patient Active Problem List   Diagnosis Date Noted   Diverticulitis 12/17/2018   Encephalopathy acute 01/04/2016   Hip fracture (HCC) 05/23/2015    Past Surgical History:  Procedure Laterality Date   ABDOMINAL HYSTERECTOMY     APPENDECTOMY     CATARACT EXTRACTION W/PHACO Right 11/02/2017   Procedure: CATARACT EXTRACTION PHACO AND INTRAOCULAR LENS PLACEMENT (IOC);  Surgeon: Galen ManilaPorfilio, William, MD;  Location: [facility] ORS;  Service: Ophthalmology;  Laterality: Right;  US 00:37 AP% 15.0 CDE 5.58 Fluid pack lot # [id number]   CATARACT EXTRACTION W/PHACO Left 12/07/2017   Procedure:  CATARACT EXTRACTION PHACO AND INTRAOCULAR LENS PLACEMENT (IOC);  Surgeon: Galen ManilaPorfilio, William, MD;  Location: [facility] ORS;  Service: Ophthalmology;  Laterality: Left;  US 00:34 AP% 14.2 CDE 4.88 Fluid pack lot # [id number]   CHOLECYSTECTOMY     THYROIDECTOMY, PARTIAL     TOTAL HIP ARTHROPLASTY Right 05/24/2015   Procedure: TOTAL HIP ARTHROPLASTY ANTERIOR APPROACH;  Surgeon: Kennedy BuckerMichael Menz, MD;  Location: [facility] ORS;  Service: Orthopedics;  Laterality: Right;    Prior to Admission medications   Medication Sig Start Date End Date Taking? Authorizing Provider  acetaminophen (TYLENOL) 500 MG tablet Take 500-1,000 mg by mouth every 6 (six) hours as needed.   Yes [provider]  atenolol (TENORMIN) 50 MG tablet Take 50 mg by mouth daily.   Yes [provider]  carbidopa-levodopa (SINEMET IR) 10-100 MG tablet Take 1 tablet by mouth 2 (two) times daily.   Yes [provider]  cholecalciferol (VITAMIN D3) 25 MCG (1000 UT) tablet Take 1,000 Units by mouth daily.   Yes [provider]  ketotifen (ZADITOR) 0.025 % ophthalmic solution Place 1 drop into both eyes daily.   Yes [provider]  levothyroxine (SYNTHROID, LEVOTHROID) 100 MCG tablet Take 100 mcg by mouth daily.   Yes [provider]  lidocaine (LIDODERM) 5 % Place 1 patch onto the skin daily. Remove & Discard patch within 12 hours or as directed by MD   Yes [provider]  lisinopril (ZESTRIL) 5 MG tablet Take 7.5 mg by mouth daily.  Yes [provider]  loratadine (CLARITIN) 10 MG tablet Take 10 mg by mouth daily.   Yes [provider]  mometasone (NASONEX) 50 MCG/ACT nasal spray Place 2 sprays into the nose daily.   Yes [provider]  Nutritional Supplements (ENSURE PLUS PO) Take 237 mLs by mouth 2 (two) times a day.   Yes [provider]  pravastatin (PRAVACHOL) 20 MG tablet Take 20 mg by mouth at bedtime.   Yes [provider]    primidone (MYSOLINE) 250 MG tablet Take 250 mg by mouth at bedtime.    Yes [provider]  primidone (MYSOLINE) 50 MG tablet Take 50-100 mg by mouth 2 (two) times a day. Give 100 mg in the morning, and 50 mg at noon. Give noon dose at pace on center days   Yes [provider]  trospium (SANCTURA) 20 MG tablet Take 20 mg by mouth daily.   Yes [provider]    Allergies Codeine  Family History  Problem Relation Age of Onset   Diabetes Mellitus II Mother    Other Father        Tremors    Social History Social History   Tobacco Use   Smoking status: Never Smoker   Smokeless tobacco: Current User    Types: Snuff  Substance Use Topics   Alcohol use: No   Drug use: No    Review of Systems  Review of Systems  Constitutional: Positive for fatigue. Negative for fever.  HENT: Negative for congestion and sore throat.   Eyes: Negative for visual disturbance.  Respiratory: Negative for cough and shortness of breath.   Cardiovascular: Negative for chest pain.  Gastrointestinal: Positive for abdominal pain, nausea and vomiting. Negative for diarrhea.  Genitourinary: Negative for flank pain.  Musculoskeletal: Negative for back pain and neck pain.  Skin: Negative for rash and wound.  Neurological: Positive for weakness.  All other systems reviewed and are negative.    ____________________________________________  PHYSICAL EXAM:      VITAL SIGNS: ED Triage Vitals  Enc Vitals Group     BP 01/11/19 1308 (!) 56/35     Pulse Rate 01/11/19 1308 (!) 59     Resp 01/11/19 1308 13     Temp 01/11/19 1308 98.6 F (37 C)     Temp Source 01/11/19 1308 Axillary     SpO2 01/11/19 1308 92 %     Weight 01/11/19 1305 135 lb (61.2 kg)     Height 01/11/19 1305 5\' 5"  (1.651 m)     Head Circumference --      Peak Flow --      Pain Score 01/11/19 1304 10     Pain Loc --      Pain Edu? --      Excl. in GC? --      Physical Exam Vitals signs and nursing  note reviewed.  Constitutional:      General: She is not in acute distress.    Appearance: She is well-developed. She is ill-appearing.  HENT:     Head: Normocephalic and atraumatic.     Comments: Dry MM Eyes:     Conjunctiva/sclera: Conjunctivae normal.  Neck:     Musculoskeletal: Neck supple.  Cardiovascular:     Rate and Rhythm: Normal rate and regular rhythm.     Heart sounds: Normal heart sounds. No murmur. No friction rub.  Pulmonary:     Effort: Pulmonary effort is normal. No respiratory distress.  Breath sounds: Normal breath sounds. No wheezing or rales.  Abdominal:     General: There is no distension.     Palpations: Abdomen is soft.     Tenderness: There is generalized abdominal tenderness. There is guarding. There is no rebound.  Skin:    General: Skin is warm.     Capillary Refill: Capillary refill takes less than 2 seconds.  Neurological:     Mental Status: She is alert and oriented to person, place, and time.     Motor: No abnormal muscle tone.       ____________________________________________   LABS (all labs ordered are listed, but only abnormal results are displayed)  Labs Reviewed  GLUCOSE, CAPILLARY - Abnormal; Notable for the following components:      Result Value   Glucose-Capillary 157 (*)    All other components within normal limits  CBC WITH DIFFERENTIAL/PLATELET - Abnormal; Notable for the following components:   WBC 10.7 (*)    Hemoglobin 15.5 (*)    HCT 47.5 (*)    All other components within normal limits  COMPREHENSIVE METABOLIC PANEL - Abnormal; Notable for the following components:   Glucose, Bld 164 (*)    Creatinine, Ser 1.12 (*)    Total Protein 6.2 (*)    Alkaline Phosphatase 130 (*)    GFR calc non Af Amer 44 (*)    GFR calc Af Amer 52 (*)    All other components within normal limits  LACTIC ACID, PLASMA - Abnormal; Notable for the following components:   Lactic Acid, Venous 2.8 (*)    All other components within normal  limits  TROPONIN I (HIGH SENSITIVITY) - Abnormal; Notable for the following components:   Troponin I (High Sensitivity) 265 (*)    All other components within normal limits  URINALYSIS, COMPLETE (UACMP) WITH MICROSCOPIC - Abnormal; Notable for the following components:   Color, Urine YELLOW (*)    APPearance CLEAR (*)    Protein, ur 30 (*)    All other components within normal limits  CULTURE, BLOOD (ROUTINE X 2)  CULTURE, BLOOD (ROUTINE X 2)  SARS CORONAVIRUS 2 (HOSPITAL ORDER, PERFORMED IN [facility])  TROPONIN I (HIGH SENSITIVITY)  LIPASE, BLOOD  PROTIME-INR  SAMPLE TO BLOOD BANK  SAMPLE TO BLOOD BANK    ____________________________________________  EKG: Sinus bradycardia, VR 54. Normal intervals. No ST elevations or depressions. Normal EKG. ________________________________________  RADIOLOGY All imaging, including plain films, CT scans, and ultrasounds, independently reviewed by me, and interpretations confirmed via formal radiology reads.  ED MD interpretation:   CXR: Persistent atelectasis. No new focal findings. CT: Pending  Official radiology report(s): Dg Chest Port 1 View  Result Date: 01/11/2019 CLINICAL DATA:  Sepsis. EXAM: PORTABLE CHEST 1 VIEW COMPARISON:  December 17, 2018 FINDINGS: The heart size is stable. There are persistent airspace opacities at the lung bases bilaterally, left worse than right. These are favored to represent chronic atelectasis or scarring. There is no pneumothorax. No large pleural effusion. No acute osseous abnormality. IMPRESSION: 1. No acute cardiopulmonary process. 2. Persistent bibasilar airspace opacities favored to represent atelectasis. A developing infiltrate is difficult to exclude. Electronically Signed   By: Katherine Mantlehristopher  Green M.D.   On: 01/11/2019 14:13    ____________________________________________  PROCEDURES   Procedure(s) performed (including Critical Care):  .Critical Care Performed by: Shaune PollackIsaacs, Charlene Detter,  MD Authorized by: Shaune PollackIsaacs, Machi Whittaker, MD   Critical care provider statement:    Critical care time (minutes):  35   Critical  care time was exclusive of:  Separately billable procedures and treating other patients and teaching time   Critical care was necessary to treat or prevent imminent or life-threatening deterioration of the following conditions:  Circulatory failure, cardiac failure and sepsis   Critical care was time spent personally by me on the following activities:  Development of treatment plan with patient or surrogate, discussions with consultants, evaluation of patient's response to treatment, examination of patient, obtaining history from patient or surrogate, ordering and performing treatments and interventions, ordering and review of laboratory studies, ordering and review of radiographic studies, pulse oximetry, re-evaluation of patient's condition and review of old charts   I assumed direction of critical care for this patient from another provider in my specialty: no      ____________________________________________  INITIAL IMPRESSION / MDM / [place] [facility] / ED COURSE  As part of my medical decision making, I reviewed the following data within the electronic MEDICAL RECORD NUMBER Notes from prior ED visits and [place] Controlled Substance Database      *Fidela Cieslak was evaluated in Emergency Department on 01/11/2019 for the symptoms described in the history of present illness. She was evaluated in the context of the global COVID-19 pandemic, which necessitated consideration that the patient might be at risk for infection with the SARS-CoV-2 virus that causes COVID-19. Institutional protocols and algorithms that pertain to the evaluation of patients at risk for COVID-19 are in a state of rapid change based on information released by regulatory bodies including the CDC and federal and state organizations. These policies and algorithms were followed during the patient's care in the ED.   Some ED evaluations and interventions may be delayed as a result of limited staffing during the pandemic.*      Medical Decision Making: 85 yo F here with severe abd pain. On arrival, pt hypotensive, ill-appearing. Code sepsis initiated with broad-spectrum intra-abd coverage, 30 cc/kg fluids. Will send for stat CT Angio to eval fo ischemic colitis given degree of pain w/ hypotension, will also show AAA, complications of recent diverticulitis/possible perforation. Pt mentating at baseline per review of records.   Initial labs show moderate leukocytosis, lactic acidosis consistent with sepsis. CT pending. BP improving with IVF without pressors. Plan to f/u CT A/P, admit.  ____________________________________________  FINAL CLINICAL IMPRESSION(S) / ED DIAGNOSES  Final diagnoses:  Sepsis, due to unspecified organism, unspecified whether acute organ dysfunction present [facility])  Generalized abdominal pain  Lactic acidosis     MEDICATIONS GIVEN DURING THIS VISIT:  Medications  sodium chloride 0.9 % bolus 2,000 mL (0 mLs Intravenous Stopped 01/11/19 1357)  ceFEPIme (MAXIPIME) 2 g in sodium chloride 0.9 % 100 mL IVPB (0 g Intravenous Stopped 01/11/19 1506)  metroNIDAZOLE (FLAGYL) IVPB 500 mg (0 mg Intravenous Stopped 01/11/19 1507)  sodium chloride 0.9 % bolus 1,000 mL (1,000 mLs Intravenous New Bag/Given 01/11/19 1506)  iohexol (OMNIPAQUE) 350 MG/ML injection 75 mL (75 mLs Intravenous Contrast Given 01/11/19 1444)     ED Discharge Orders    None       Note:  This document was prepared using Dragon voice recognition software and may include unintentional dictation errors.   Duffy Bruce, MD 01/11/19 [phone]

## 2019-01-11 NOTE — Progress Notes (Signed)
CODE SEPSIS - PHARMACY COMMUNICATION  **Broad Spectrum Antibiotics should be administered within 1 hour of Sepsis diagnosis**  Time Code Sepsis Called/Page Received: 1335   Antibiotics Ordered: Cefepime,Flagyl  Time of 1st antibiotic administration: 1358  Additional action taken by pharmacy: none  If necessary, Name of Provider/Nurse Contacted: N/A    Pearla Dubonnet ,PharmD Clinical Pharmacist  01/11/2019  2:41 PM

## 2019-01-11 NOTE — Progress Notes (Signed)
PHARMACY -  BRIEF ANTIBIOTIC NOTE   Pharmacy has received consult(s) for Cefepime from an ED provider.  The patient's profile has been reviewed for ht/wt/allergies/indication/available labs.    One time order(s) placed for Cefepime 2g IV  Further antibiotics/pharmacy consults should be ordered by admitting physician if indicated.                       Thank you, Pearla Dubonnet 01/11/2019  1:48 PM

## 2019-01-11 NOTE — ED Notes (Signed)
NS bolus not finished yet as pt keeps bending her L arm. Educated many times to keep arm straight so IV fluids can finish running.

## 2019-01-11 NOTE — ED Triage Notes (Signed)
First RN Note: Pt presents to ED via [facility] with c/o abdominal pain. Per EMS pt having lower abdominal pain at this time, dx with diverticulitis 2 week ago. EMS reports pt has not followed up with PCP since being dx, reports finished abx last week.   132/98 HR 59 97% on RA 97.3 Oral 132/98

## 2019-01-11 NOTE — Progress Notes (Signed)
Notified bedside nurse of need to draw lactic acid.  

## 2019-01-11 NOTE — ED Notes (Addendum)
Pt assisted onto bedpan to urinate.  

## 2019-01-11 NOTE — ED Provider Notes (Signed)
Patient received in signout from [place].  In short patient presenting with hypotension and abdominal pain.  Work-up pending CT angiogram COVID testing.  Patient received IV fluids for resuscitation and broad-spectrum antibiotics.  CT angiogram does not show any evidence of mesenteric ischemia or hemodynamic abnormality however does show some perinephric stranding possibly suggesting early pyelonephritis as well as some left descending colon inflammation without perforation or abscess.  Could be colitis or transient ischemic colitis in the setting of hypotension.    She denies any chest pain or pressure.  There is no evidence of ischemia on EKG.  Hemodynamics improving.  Awaiting covid result for appropriate disposition.   Merlyn Lot, MD 01/11/19 1726

## 2019-01-11 NOTE — ED Notes (Addendum)
ED TO INPATIENT HANDOFF REPORT  ED Nurse Name and Phone #: Greta DoomGeorgie [phone]  S Name/Age/Gender Nicoletta DressSarah Dedominicis 85 y.o. female Room/Bed: ED02A/ED02A  Code Status   Code Status: Prior  Home/SNF/Other From home with daughter; per pt, okay to give updates to daughter as needed Patient oriented to: self, place, time and situation Is this baseline? Yes   Triage Complete: Triage complete  Chief Complaint ems diverticulitis  Triage Note First RN Note: Pt presents to ED via [facility] with c/o abdominal pain. Per EMS pt having lower abdominal pain at this time, dx with diverticulitis 2 week ago. EMS reports pt has not followed up with PCP since being dx, reports finished abx last week.   132/98 HR 59 97% on RA 97.3 Oral 132/98  Pt c/o abd pain that started this am - she arrived via ems - she denies any other complaint including N/V/D   Allergies Allergies  Allergen Reactions  . Codeine Rash    Level of Care/Admitting Diagnosis ED Disposition    None      B Medical/Surgery History Past Medical History:  Diagnosis Date  . Dementia (HCC)   . Depression   . Fatigue   . Femur fracture, right (HCC)   . GERD (gastroesophageal reflux disease)   . Hyperlipidemia   . Hypertension   . Hypothyroidism   . Osteoarthritis   . Tremor    benign essential tremor- tremors of voice, head and jaw  . Weakness    Past Surgical History:  Procedure Laterality Date  . ABDOMINAL HYSTERECTOMY    . APPENDECTOMY    . CATARACT EXTRACTION W/PHACO Right 11/02/2017   Procedure: CATARACT EXTRACTION PHACO AND INTRAOCULAR LENS PLACEMENT (IOC);  Surgeon: Galen ManilaPorfilio, William, MD;  Location: [facility] ORS;  Service: Ophthalmology;  Laterality: Right;  US 00:37 AP% 15.0 CDE 5.58 Fluid pack lot # [id number]  . CATARACT EXTRACTION W/PHACO Left 12/07/2017   Procedure: CATARACT EXTRACTION PHACO AND INTRAOCULAR LENS PLACEMENT (IOC);  Surgeon: Galen ManilaPorfilio, William, MD;  Location: [facility] ORS;  Service: Ophthalmology;   Laterality: Left;  US 00:34 AP% 14.2 CDE 4.88 Fluid pack lot # [id number]  . CHOLECYSTECTOMY    . THYROIDECTOMY, PARTIAL    . TOTAL HIP ARTHROPLASTY Right 05/24/2015   Procedure: TOTAL HIP ARTHROPLASTY ANTERIOR APPROACH;  Surgeon: Kennedy BuckerMichael Menz, MD;  Location: [facility] ORS;  Service: Orthopedics;  Laterality: Right;     A IV Location/Drains/Wounds Patient Lines/Drains/Airways Status   Active Line/Drains/Airways    Name:   Placement date:   Placement time:   Site:   Days:   Peripheral IV 01/11/19 Left Antecubital   01/11/19    1323    Antecubital   less than 1   External Urinary Catheter   12/18/18    0933    -   24   Airway   12/07/17    1142     400   Incision (Closed) 05/24/15 Hip Right   05/24/15    1540     1328   Incision (Closed) 11/02/17 Eye Right   11/02/17    0953     435   Incision (Closed) 12/07/17 Eye Left   12/07/17    1144     400          Intake/Output Last 24 hours No intake or output data in the 24 hours ending 01/11/19 1756  Labs/Imaging Results for orders placed or performed during the hospital encounter of 01/11/19 (from the past 48 hour(s))  Glucose, capillary  Status: Abnormal   Collection Time: 01/11/19  1:18 PM  Result Value Ref Range   Glucose-Capillary 157 (H) 70 - 99 mg/dL  CBC with Differential     Status: Abnormal   Collection Time: 01/11/19  1:21 PM  Result Value Ref Range   WBC 10.7 (H) 4.0 - 10.5 K/uL   RBC 5.02 3.87 - 5.11 MIL/uL   Hemoglobin 15.5 (H) 12.0 - 15.0 g/dL   HCT 16.147.5 (H) 09.636.0 - 04.546.0 %   MCV 94.6 80.0 - 100.0 fL   MCH 30.9 26.0 - 34.0 pg   MCHC 32.6 30.0 - 36.0 g/dL   RDW 40.913.8 81.111.5 - 91.415.5 %   Platelets 268 150 - 400 K/uL   nRBC 0.0 0.0 - 0.2 %   Neutrophils Relative % 68 %   Neutro Abs 7.3 1.7 - 7.7 K/uL   Lymphocytes Relative 23 %   Lymphs Abs 2.4 0.7 - 4.0 K/uL   Monocytes Relative 4 %   Monocytes Absolute 0.5 0.1 - 1.0 K/uL   Eosinophils Relative 3 %   Eosinophils Absolute 0.3 0.0 - 0.5 K/uL   Basophils Relative 1 %    Basophils Absolute 0.1 0.0 - 0.1 K/uL   Immature Granulocytes 1 %   Abs Immature Granulocytes 0.05 0.00 - 0.07 K/uL    Comment: Performed at [facility], [address] [place], [place] [address]  Comprehensive metabolic panel     Status: Abnormal   Collection Time: 01/11/19  1:21 PM  Result Value Ref Range   Sodium 140 135 - 145 mmol/L   Potassium 4.3 3.5 - 5.1 mmol/L   Chloride 105 98 - 111 mmol/L   CO2 24 22 - 32 mmol/L   Glucose, Bld 164 (H) 70 - 99 mg/dL   BUN 17 8 - 23 mg/dL   Creatinine, Ser 6.211.12 (H) 0.44 - 1.00 mg/dL   Calcium 8.9 8.9 - 30.810.3 mg/dL   Total Protein 6.2 (L) 6.5 - 8.1 g/dL   Albumin 3.7 3.5 - 5.0 g/dL   AST 26 15 - 41 U/L   ALT 10 0 - 44 U/L   Alkaline Phosphatase 130 (H) 38 - 126 U/L   Total Bilirubin 0.5 0.3 - 1.2 mg/dL   GFR calc non Af Amer 44 (L) >60 mL/min   GFR calc Af Amer 52 (L) >60 mL/min   Anion gap 11 5 - 15    Comment: Performed at [facility], [address] [place], [place] [address]  Troponin I (High Sensitivity)     Status: None   Collection Time: 01/11/19  1:21 PM  Result Value Ref Range   Troponin I (High Sensitivity) 3 <18 ng/L    Comment: (NOTE) Elevated high sensitivity troponin I (hsTnI) values and significant  changes across serial measurements may suggest ACS but many other  chronic and acute conditions are known to elevate hsTnI results.  Refer to the "Links" section for chest pain algorithms and additional  guidance. Performed at [facility], [address] [place], [place] [address]   Lipase, blood     Status: None   Collection Time: 01/11/19  1:21 PM  Result Value Ref Range   Lipase 29 11 - 51 U/L    Comment: Performed at [facility], [address] [place], [place] [address]  Protime-INR     Status: None   Collection Time: 01/11/19  1:21 PM  Result Value Ref Range   Prothrombin Time 12.8 11.4 - 15.2 seconds  INR 1.0 0.8 - 1.2    Comment: (NOTE) INR goal varies based on  device and disease states. Performed at [facility], [address] [place], [place] [address]   Lactic acid, plasma     Status: Abnormal   Collection Time: 01/11/19  1:44 PM  Result Value Ref Range   Lactic Acid, Venous 2.8 (HH) 0.5 - 1.9 mmol/L    Comment: CRITICAL RESULT CALLED TO, READ BACK BY AND VERIFIED WITH ALLY RILEY RN AT 1433 ON 01/11/2019 SNG Performed at [facility], [address] [place], [place] [address]   Urinalysis, Complete w Microscopic     Status: Abnormal   Collection Time: 01/11/19  1:44 PM  Result Value Ref Range   Color, Urine YELLOW (A) YELLOW   APPearance CLEAR (A) CLEAR   Specific Gravity, Urine 1.008 1.005 - 1.030   pH 7.0 5.0 - 8.0   Glucose, UA NEGATIVE NEGATIVE mg/dL   Hgb urine dipstick NEGATIVE NEGATIVE   Bilirubin Urine NEGATIVE NEGATIVE   Ketones, ur NEGATIVE NEGATIVE mg/dL   Protein, ur 30 (A) NEGATIVE mg/dL   Nitrite NEGATIVE NEGATIVE   Leukocytes,Ua NEGATIVE NEGATIVE   RBC / HPF 0-5 0 - 5 RBC/hpf   WBC, UA 0-5 0 - 5 WBC/hpf   Bacteria, UA NONE SEEN NONE SEEN   Squamous Epithelial / LPF 0-5 0 - 5   Mucus PRESENT     Comment: Performed at [facility], [address] [place], [place] [address]  Sample to Blood Bank     Status: None   Collection Time: 01/11/19  1:55 PM  Result Value Ref Range   Blood Bank Specimen      SPECIMEN HEMOLYSED RECOLLECT REQUEST TO ALLISON RILEY AT 1348 ON 01/11/2019 KLM    Sample Expiration      01/14/2019,2359 Performed at [facility], [address] [place], [place] [address]   Sample to Blood Bank     Status: None   Collection Time: 01/11/19  3:08 PM  Result Value Ref Range   Blood Bank Specimen SAMPLE AVAILABLE FOR TESTING    Sample Expiration      01/14/2019,2359 Performed at [facility], [address] [place], [place] [address]   Troponin I (High Sensitivity)     Status: Abnormal   Collection Time: 01/11/19  4:28 PM  Result Value Ref Range    Troponin I (High Sensitivity) 265 (HH) <18 ng/L    Comment: CRITICAL RESULT CALLED TO, READ BACK BY AND VERIFIED WITH [place] Machell Wirthlin 01/11/19 1718 KLW (NOTE) Elevated high sensitivity troponin I (hsTnI) values and significant  changes across serial measurements may suggest ACS but many other  chronic and acute conditions are known to elevate hsTnI results.  Refer to the "Links" section for chest pain algorithms and additional  guidance. Performed at [facility], [address] [place], [place] [address]    Dg Chest Port 1 View  Result Date: 01/11/2019 CLINICAL DATA:  Sepsis. EXAM: PORTABLE CHEST 1 VIEW COMPARISON:  December 17, 2018 FINDINGS: The heart size is stable. There are persistent airspace opacities at the lung bases bilaterally, left worse than right. These are favored to represent chronic atelectasis or scarring. There is no pneumothorax. No large pleural effusion. No acute osseous abnormality. IMPRESSION: 1. No acute cardiopulmonary process. 2. Persistent bibasilar airspace opacities favored to represent atelectasis. A developing infiltrate is difficult to exclude. Electronically Signed   By: Katherine Mantlehristopher  Green M.D.   On: 01/11/2019 14:13  Ct Angio Abd/pel W And/or Wo Contrast  Result Date: 01/11/2019 CLINICAL DATA:  Lower abdominal pain. Evaluate for mesenteric ischemia. EXAM: CTA ABDOMEN AND PELVIS WITH CONTRAST TECHNIQUE: Multidetector CT imaging of the abdomen and pelvis was performed using the standard protocol during bolus administration of intravenous contrast. Multiplanar reconstructed images and MIPs were obtained and reviewed to evaluate the vascular anatomy. CONTRAST:  75mL OMNIPAQUE IOHEXOL 350 MG/ML SOLN COMPARISON:  CT abdomen and pelvis-12/17/2018 FINDINGS: VASCULAR Aorta: There is a moderate to large amount of eccentric irregular mixed calcified and noncalcified atherosclerotic plaque throughout the normal caliber abdominal aorta, not resulting in a hemodynamically  significant stenosis. No evidence of abdominal aortic dissection or periaortic stranding on this nongated examination. Celiac: There is a minimal amount of eccentric mixed calcified and noncalcified atherosclerotic plaque involving the origin the celiac artery, not resulting in a hemodynamically significant stenosis. Conventional branching pattern. SMA: Widely patent without hemodynamically significant stenosis. Conventional branching pattern. The distal tributaries of the SMA appear widely patent without discrete intraluminal filling defect to suggest distal embolism with special attention paid to the vascular distribution supplying the descending colon. Renals: Solitary bilaterally. There is a minimal amount of eccentric mixed calcified and noncalcified atherosclerotic plaque involving the cranial aspect of the origin of the right renal artery, not definitely resulting in hemodynamically significant stenosis. No vessel irregularity to suggest FMD. IMA: Diseased at its origin though remains widely patent with early collateral supply from the SMA. Inflow: There is a moderate amount of eccentric mixed calcified and noncalcified affecting the bilateral common iliac arteries, not definitely resulting in hemodynamically significant stenosis. The bilateral internal iliac arteries are disease though patent of normal caliber. The bilateral external iliac arteries are widely patent without hemodynamically significant stenosis. Proximal Outflow: There is a moderate amount of eccentric mixed calcified and noncalcified atherosclerotic plaque involving the bilateral common femoral arteries, not definitely resulting in hemodynamically significant stenosis. The imaged aspects of the bilateral deep and superficial femoral arteries are widely patent without hemodynamically significant stenosis. Veins: The IVC and pelvic venous system appear widely patent. Review of the MIP images confirms the above findings.  _________________________________________________________ NON-VASCULAR Lower chest: Limited visualization of the lower thorax demonstrates minimal bibasilar ground-glass opacities, favored to represent atelectasis. No discrete focal airspace opacities. No pleural effusion. Borderline cardiomegaly. Trace amount of pericardial fluid, presumably physiologic. Hepatobiliary: Normal hepatic contour. There is a wedge shaped perfusional abnormality involving the subcapsular aspect of the right lobe of the liver on the arterial phase images (image 47, series 4), without definitive correlate on the acquired portal venous phase images (image 52, series 8), and without correlate on recent CT scan of the abdomen pelvis performed 12/17/2018 favored to be artifactual due to phase of enhancement. No discrete hepatic lesions. The hepatic portal veins appear widely patent. Post cholecystectomy. The common bile duct is mildly dilated with mild centralized intrahepatic biliary duct dilatation, unchanged, and favored to be sequela of post cholecystectomy state and biliary reservoir phenomena. No ascites. Pancreas: Normal appearance of the pancreas. Spleen: Normal appearance of the spleen. Note is made of a small splenule. Adrenals/Urinary Tract: There is symmetric enhancement of the bilateral kidneys. Unchanged bilateral punctate hypoattenuating renal lesions are too small to accurately characterize though favored to represent renal cysts. No definite renal stones this postcontrast examination. No evidence of urinary obstruction, however there is hyperemia involving the bilateral renal pelves and bilateral ureters. No definitive cortical mottled enhancement to suggest pyelonephritis. While there is no evidence of urinary obstruction, the amount  symmetric bilateral perinephric stranding has progressed compared to the 12/17/2018 examination. There is mild thickening of the bilateral adrenal glands without discrete nodule. Note is again  made of a tiny (approximately 1.9 x 1.8 cm) diverticulum arising from the left side of the mid body of the urinary bladder (image 189, series 8). Stomach/Bowel: Redemonstrated extensive diverticulosis, primarily involving the sigmoid colon within the left lower abdomen/pelvis. There is nonspecific ill-defined stranding surrounding about the descending colon within the left pericolic gutter (representative images 84, 99, 125 and 137, series 8), progressed compared to the 12/17/2018 examination. No evidence of perforation or definable/drainable fluid collection. Moderate-sized hiatal hernia. Large colonic stool burden without definitive evidence of enteric obstruction. Normal appearance of the terminal ileum. The appendix is not visualized compatible provided operative history. No pneumoperitoneum, pneumatosis or portal venous gas. Lymphatic: No bulky retroperitoneal mesenteric, pelvic or inguinal lymphadenopathy. Reproductive: Post hysterectomy. No discrete adnexal lesion. No free fluid the pelvic cul-de-sac. Other: Minimal amount of subcutaneous edema about the midline of the low back. Musculoskeletal: No acute or aggressive osseous abnormalities. Moderate to severe multilevel lumbar spine DDD, worse at L3-L4 with disc space height loss, endplate irregularity and sclerosis. Post right total hip replacement, incompletely evaluated but without evidence of hardware failure or loosening. IMPRESSION: VASCULAR 1. Moderate to large amount of atherosclerotic plaque within normal caliber abdominal aorta, not resulting in a hemodynamically significant stenosis. Aortic Atherosclerosis (ICD10-I70.0). 2. No CT evidence of mesenteric ischemia. NON-VASCULAR 1. Extensive diverticulosis involving the sigmoid colon within the left lower abdomen/pelvis. 2. Nonspecific rather diffuse stranding about the descending colon without evidence of enteric obstruction, perforation or definable/drainable fluid collection - findings are  nonspecific though could be seen in the setting of a colitis, though conceivably uncomplicated diverticulitis could have a similar appearance. Clinical correlation is advised. 3. Hyperemia about the bilateral renal pelves and ureters with associated worsening bilateral perinephric stranding but without evidence of urinary obstruction - findings are nonspecific though could be seen in the setting of a urinary tract infection. Currently, there is no CT evidence of pyelonephritis. Correlation with urinalysis is advised. 4. Moderate-sized hiatal hernia. Critical Value/emergent results were called by telephone at the time of interpretation on 01/11/2019 at 5:20 pm to Dr. Quentin Cornwall who verbally acknowledged these results. Electronically Signed   By: Sandi Mariscal M.D.   On: 01/11/2019 17:32    Pending Labs Unresulted Labs (From admission, onward)    Start     Ordered   01/11/19 1654  SARS Coronavirus 2 (CEPHEID- Performed in [place] hospital lab), Hosp Order  (Symptomatic Patients Labs with Precautions )  ONCE - STAT,   STAT    Question:  Patient immune status  Answer:  Normal   01/11/19 1653   01/11/19 1334  Blood culture (routine x 2)  BLOOD CULTURE X 2,   STAT     01/11/19 1333          Vitals/Pain Today's Vitals   01/11/19 1620 01/11/19 1630 01/11/19 1640 01/11/19 1650  BP: 113/78 117/70 128/77   Pulse: 84 82 81 84  Resp: 15 18 14 18   Temp:      TempSrc:      SpO2: 96% 97% 97% 100%  Weight:      Height:      PainSc:        Isolation Precautions Airborne and Contact precautions  Medications Medications  sodium chloride 0.9 % bolus 2,000 mL (0 mLs Intravenous Stopped 01/11/19 1357)  ceFEPIme (MAXIPIME) 2 g in  sodium chloride 0.9 % 100 mL IVPB (0 g Intravenous Stopped 01/11/19 1506)  metroNIDAZOLE (FLAGYL) IVPB 500 mg (0 mg Intravenous Stopped 01/11/19 1507)  sodium chloride 0.9 % bolus 1,000 mL (1,000 mLs Intravenous New Bag/Given 01/11/19 1506)  iohexol (OMNIPAQUE) 350 MG/ML injection 75  mL (75 mLs Intravenous Contrast Given 01/11/19 1444)    Mobility walks with device Low fall risk   Focused Assessments Diverticulosis per imaging; initial drop in BP to 56/32; BP sustained WDL last few hours; NSR 80s; 95% RA; diarrhea   R Recommendations: See Admitting Provider Note  Report given to:   Additional Notes:  Pt has history/baseline tremor.

## 2019-01-11 NOTE — Consult Note (Signed)
Pharmacy Antibiotic Note  Brandy Oconnor is a 85 y.o. female admitted on 01/11/2019 with intra-abdominal infection.  Pharmacy has been consulted for Zosyn dosing.  Plan: Zosyn 3.375g IV q8h (4 hour infusion).  Height: 5\' 5"  (165.1 cm) Weight: 135 lb (61.2 kg) IBW/kg (Calculated) : 57  Temp (24hrs), Avg:98.1 F (36.7 C), Min:97.5 F (36.4 C), Max:98.6 F (37 C)  Recent Labs  Lab 01/11/19 1321 01/11/19 1344  WBC 10.7*  --   CREATININE 1.12*  --   LATICACIDVEN  --  2.8*    Estimated Creatinine Clearance: 32.4 mL/min (A) (by C-G formula based on SCr of 1.12 mg/dL (H)).    Allergies  Allergen Reactions  . Codeine Rash    Antimicrobials this admission: Zosyn 7/1 >>  Cefepime 7/1 x 1 dose Flagyl 7/1 x 1 dose   Microbiology results: 7/1 BCx: pending   Thank you for allowing pharmacy to be a part of this patient's care.  Brandy Oconnor A Lasha Echeverria 01/11/2019 7:08 PM

## 2019-01-11 NOTE — ED Notes (Signed)
Pt assisted onto bedpan to urinate. Will provide peri care once pt finished.

## 2019-01-11 NOTE — ED Notes (Signed)
Arbie Cookey called this RN requesting update. Brief update given. Will call pt's daughter back with any other updates/as needed.

## 2019-01-11 NOTE — Progress Notes (Signed)
Notified provider and bedside nurse of need to draw lactic acid.

## 2019-01-11 NOTE — ED Notes (Signed)
Pt had another episode of diarrhea; pt urinated in bed; pt also urinated into bedpan. Peri care provided. Linens changed. Gown changed again.

## 2019-01-11 NOTE — ED Notes (Signed)
Pt had large BM; diarrhea. Peri care provided. All linens changed. Pillow given. Pt repositioned in bed.

## 2019-01-11 NOTE — ED Notes (Signed)
Patient transported to CT 

## 2019-01-11 NOTE — ED Triage Notes (Signed)
Pt c/o abd pain that started this am - she arrived via ems - she denies any other complaint including N/V/D

## 2019-01-11 NOTE — Consult Note (Signed)
ANTICOAGULATION CONSULT NOTE - Initial Consult  Pharmacy Consult for Heparin Indication: chest pain/ACS  Allergies  Allergen Reactions  . Codeine Rash    Patient Measurements: Height: 5\' 5"  (165.1 cm) Weight: 135 lb (61.2 kg) IBW/kg (Calculated) : 57 Heparin Dosing Weight: 61.2 kg  Vital Signs: Temp: 97.5 F (36.4 C) (07/01 1513) Temp Source: Oral (07/01 1513) BP: 106/35 (07/01 1800) Pulse Rate: 85 (07/01 1800)  Labs: Recent Labs    01/11/19 1321 01/11/19 1628  HGB 15.5*  --   HCT 47.5*  --   PLT 268  --   LABPROT 12.8  --   INR 1.0  --   CREATININE 1.12*  --   TROPONINIHS 3 265*    Estimated Creatinine Clearance: 32.4 mL/min (A) (by C-G formula based on SCr of 1.12 mg/dL (H)).   Medical History: Past Medical History:  Diagnosis Date  . Dementia ([place])   . Depression   . Fatigue   . Femur fracture, right ([place])   . GERD (gastroesophageal reflux disease)   . Hyperlipidemia   . Hypertension   . Hypothyroidism   . Osteoarthritis   . Tremor    benign essential tremor- tremors of voice, head and jaw  . Weakness     Medications:  (Not in a hospital admission)  Scheduled:   Infusions:  . heparin 700 Units/hr (01/11/19 1818)   PRN:   Assessment: Pharmacy has been consulted to start Heparin Drip on 85yo patient. Patient has no prior history of DOAC use, so will initiate therapy immediately. Baseline INR and APTT have been ordered and collected.   Goal of Therapy:  Heparin level 0.3-0.7 units/ml Monitor platelets by anticoagulation protocol: Yes   Plan:  Give 3500 units bolus x 1 Start heparin infusion at 700 units/hr Check anti-Xa level in 8 hours and daily while on heparin Continue to monitor H&H and platelets  Pearla Dubonnet, PharmD Clinical Pharmacist 01/11/2019 7:02 PM

## 2019-01-11 NOTE — ED Notes (Signed)
Pt wheeled back to room with triage RN, pt unresponsive except to voice, limbs flaccid. Reported that BP was down in triage as well as HR. Pt moved to stretcher by RN's. EKG, PIV insertion and blood drawn. EDP informed. CBG checked. Pt answers to

## 2019-01-11 NOTE — ED Notes (Signed)
Admitting provider to bedside

## 2019-01-11 NOTE — ED Notes (Signed)
Pt given 2 more warm blankets.

## 2019-01-11 DEATH — deceased

## 2019-01-12 ENCOUNTER — Inpatient Hospital Stay (HOSPITAL_COMMUNITY)
Admit: 2019-01-12 | Discharge: 2019-01-12 | Disposition: A | Discharge status: Home / Self Care | Payer: Medicare (Managed Care) | Attending: Specialist | Admitting: Specialist

## 2019-01-12 DIAGNOSIS — I10 Essential (primary) hypertension: Secondary | ICD-10-CM

## 2019-01-12 DIAGNOSIS — R7989 Other specified abnormal findings of blood chemistry: Secondary | ICD-10-CM

## 2019-01-12 DIAGNOSIS — I248 Other forms of acute ischemic heart disease: Secondary | ICD-10-CM

## 2019-01-12 DIAGNOSIS — A419 Sepsis, unspecified organism: Secondary | ICD-10-CM

## 2019-01-12 DIAGNOSIS — E872 Acidosis: Secondary | ICD-10-CM

## 2019-01-12 DIAGNOSIS — K5732 Diverticulitis of large intestine without perforation or abscess without bleeding: Principal | ICD-10-CM

## 2019-01-12 DIAGNOSIS — G20 Parkinson's disease: Secondary | ICD-10-CM

## 2019-01-12 DIAGNOSIS — R1084 Generalized abdominal pain: Secondary | ICD-10-CM

## 2019-01-12 DIAGNOSIS — K5792 Diverticulitis of intestine, part unspecified, without perforation or abscess without bleeding: Secondary | ICD-10-CM

## 2019-01-12 LAB — CBC
HCT: 42.6 % (ref 36.0–46.0)
Hemoglobin: 14.1 g/dL (ref 12.0–15.0)
MCH: 31 pg (ref 26.0–34.0)
MCHC: 33.1 g/dL (ref 30.0–36.0)
MCV: 93.6 fL (ref 80.0–100.0)
Platelets: 176 10*3/uL (ref 150–400)
RBC: 4.55 MIL/uL (ref 3.87–5.11)
RDW: 14.1 % (ref 11.5–15.5)
WBC: 11.6 10*3/uL — ABNORMAL HIGH (ref 4.0–10.5)
nRBC: 0 % (ref 0.0–0.2)

## 2019-01-12 LAB — BASIC METABOLIC PANEL
Anion gap: 6 (ref 5–15)
BUN: 18 mg/dL (ref 8–23)
CO2: 25 mmol/L (ref 22–32)
Calcium: 7.8 mg/dL — ABNORMAL LOW (ref 8.9–10.3)
Chloride: 113 mmol/L — ABNORMAL HIGH (ref 98–111)
Creatinine, Ser: 0.88 mg/dL (ref 0.44–1.00)
GFR calc Af Amer: >60 mL/min (ref >60)
GFR calc non Af Amer: 59 mL/min — ABNORMAL LOW (ref >60)
Glucose, Bld: 121 mg/dL — ABNORMAL HIGH (ref 70–99)
Potassium: 4 mmol/L (ref 3.5–5.1)
Sodium: 144 mmol/L (ref 135–145)

## 2019-01-12 LAB — ECHOCARDIOGRAM COMPLETE
Height: 60 in
Weight: 2094.4 oz

## 2019-01-12 LAB — C DIFFICILE QUICK SCREEN W PCR REFLEX
C Diff antigen: NEGATIVE
C Diff interpretation: NOT DETECTED
C Diff toxin: NEGATIVE

## 2019-01-12 LAB — TSH: TSH: 0.395 u[IU]/mL (ref 0.350–4.500)

## 2019-01-12 LAB — LIPID PANEL
Cholesterol: 154 mg/dL (ref 0–200)
HDL: 39 mg/dL — ABNORMAL LOW (ref >40)
LDL Cholesterol: 88 mg/dL (ref 0–99)
Total CHOL/HDL Ratio: 3.9 RATIO
Triglycerides: 137 mg/dL (ref <150)
VLDL: 27 mg/dL (ref 0–40)

## 2019-01-12 LAB — HEPARIN LEVEL (UNFRACTIONATED)
Heparin Unfractionated: 0.34 IU/mL (ref 0.30–0.70)
Heparin Unfractionated: 0.52 IU/mL (ref 0.30–0.70)

## 2019-01-12 LAB — TROPONIN I (HIGH SENSITIVITY)
Troponin I (High Sensitivity): 234 ng/L (ref <18)
Troponin I (High Sensitivity): 287 ng/L (ref <18)

## 2019-01-12 LAB — HEMOGLOBIN A1C
Hgb A1c MFr Bld: 5.1 % (ref 4.8–5.6)
Mean Plasma Glucose: 99.67 mg/dL

## 2019-01-12 MED ORDER — LISINOPRIL 5 MG PO TABS
7.5000 mg | ORAL_TABLET | Freq: Every day | ORAL | Status: DC
  Administered 2019-01-13 – 2019-01-14 (×2): 7.5 mg via ORAL
  Filled 2019-01-12 (×2): qty 1

## 2019-01-12 MED ORDER — ENOXAPARIN SODIUM 40 MG/0.4ML ~~LOC~~ SOLN
40.0000 mg | SUBCUTANEOUS | Status: DC
  Administered 2019-01-12 – 2019-01-13 (×2): 40 mg via SUBCUTANEOUS
  Filled 2019-01-12 (×2): qty 0.4

## 2019-01-12 NOTE — Consult Note (Signed)
Cardiology Consultation:   Patient ID: Nicoletta DressSarah Kuri MRN: [id number]; DOB: 1930-08-25  Admit date: 01/11/2019 Date of Consult: 01/12/2019  Primary Care Provider: Patient, No Pcp Per Primary Cardiologist: [facility], Dr. Mariah MillingGollan rounding Primary Electrophysiologist:  None   Patient Profile:   Nicoletta DressSarah Shur is a 85 y.o. female with a hx of HTN, HLD, GERD, dementia / Parkinson's disease, essential tremor, hypothyroidism, and OA who is being seen today for the evaluation of elevated troponin  In the setting of diverticulitis at the request of Dr. Cherlynn KaiserSainani.  History of Present Illness:   Ms. Stevie KernDiggs is an 85 yo female with PMH as above and no known significant cardiac history on review of EMR. Unfortunately, the patient has baseline dementia; therefore, most of the below information was obtained on review of EMR and through discussion with her current medical team.   Patient reported that she lives with family.  She was recently in the hospital 3 weeks prior for acute diverticulitis and treated with abx. She returned to the ED 7/1 and was noted to have recurrent diverticulitis and admitted for further management. Surgery and cardiology consulted. Troponin in the ED 3  265  287  234 and in the setting of sepsis with hypotension and bradycardia. In the setting of underlying dementia, she denied CP, SOB, or other associated symptoms. EKG showed SB at 54 bpm and nonspecific changes with repeat EKG showing anterolateral T wave inversion that was new from that of previous EKG in the ED (7/1) but not a new finding from EKGs performed earlier this year / last year thus without acute changes from previous. Repeat EKGs also without acute ST/T changes. Echo ordered and pending official read. Remainder of recommendations as below.   Past Medical History:  Diagnosis Date   Dementia (HCC)    Depression    Fatigue    Femur fracture, right (HCC)    GERD (gastroesophageal reflux disease)    Hyperlipidemia     Hypertension    Hypothyroidism    Osteoarthritis    Tremor    benign essential tremor- tremors of voice, head and jaw   Weakness     Past Surgical History:  Procedure Laterality Date   ABDOMINAL HYSTERECTOMY     APPENDECTOMY     CATARACT EXTRACTION W/PHACO Right 11/02/2017   Procedure: CATARACT EXTRACTION PHACO AND INTRAOCULAR LENS PLACEMENT (IOC);  Surgeon: Galen ManilaPorfilio, William, MD;  Location: [facility];  Service: Ophthalmology;  Laterality: Right;  US 00:37 AP% 15.0 CDE 5.58 Fluid pack lot # [id number]   CATARACT EXTRACTION W/PHACO Left 12/07/2017   Procedure: CATARACT EXTRACTION PHACO AND INTRAOCULAR LENS PLACEMENT (IOC);  Surgeon: Galen ManilaPorfilio, William, MD;  Location: [facility];  Service: Ophthalmology;  Laterality: Left;  US 00:34 AP% 14.2 CDE 4.88 Fluid pack lot # [id number]   CHOLECYSTECTOMY     THYROIDECTOMY, PARTIAL     TOTAL HIP ARTHROPLASTY Right 05/24/2015   Procedure: TOTAL HIP ARTHROPLASTY ANTERIOR APPROACH;  Surgeon: Kennedy BuckerMichael Menz, MD;  Location: [facility];  Service: Orthopedics;  Laterality: Right;     Home Medications:  Prior to Admission medications   Medication Sig Start Date End Date Taking? Authorizing Provider  acetaminophen (TYLENOL) 500 MG tablet Take 500-1,000 mg by mouth every 6 (six) hours as needed.   Yes [provider]  atenolol (TENORMIN) 50 MG tablet Take 50 mg by mouth daily.   Yes [provider]  carbidopa-levodopa (SINEMET IR) 10-100 MG tablet Take 1 tablet by mouth 2 (two) times daily.   Yes [provider]  cholecalciferol (VITAMIN D3) 25 MCG (1000 UT) tablet Take 1,000 Units by mouth daily.   Yes [provider]  ketotifen (ZADITOR) 0.025 % ophthalmic solution Place 1 drop into both eyes daily.   Yes [provider]  levothyroxine (SYNTHROID, LEVOTHROID) 100 MCG tablet Take 100 mcg by mouth daily.   Yes [provider]  lidocaine (LIDODERM) 5 % Place 1 patch onto the skin daily. Remove &  Discard patch within 12 hours or as directed by MD   Yes [provider]  lisinopril (ZESTRIL) 5 MG tablet Take 7.5 mg by mouth daily.    Yes [provider]  loratadine (CLARITIN) 10 MG tablet Take 10 mg by mouth daily.   Yes [provider]  mometasone (NASONEX) 50 MCG/ACT nasal spray Place 2 sprays into the nose daily.   Yes [provider]  Nutritional Supplements (ENSURE PLUS PO) Take 237 mLs by mouth 2 (two) times a day.   Yes [provider]  pravastatin (PRAVACHOL) 20 MG tablet Take 20 mg by mouth at bedtime.   Yes [provider]  primidone (MYSOLINE) 250 MG tablet Take 250 mg by mouth at bedtime.    Yes [provider]  primidone (MYSOLINE) 50 MG tablet Take 50-100 mg by mouth 2 (two) times a day. Give 100 mg in the morning, and 50 mg at noon. Give noon dose at pace on center days   Yes [provider]  trospium (SANCTURA) 20 MG tablet Take 20 mg by mouth daily.   Yes [provider]    Inpatient Medications: Scheduled Meds:  carbidopa-levodopa  1 tablet Oral BID   cholecalciferol  1,000 Units Oral Daily   darifenacin  7.5 mg Oral Daily   ketotifen  1 drop Both Eyes Daily   levothyroxine  100 mcg Oral Daily   lidocaine  1 patch Transdermal Q24H   loratadine  10 mg Oral Daily   pravastatin  20 mg Oral QHS   primidone  100 mg Oral BH-q7a   And   primidone  50 mg Oral Q1200   primidone  250 mg Oral QHS   Continuous Infusions:  sodium chloride 100 mL/hr at 01/12/19 0029   heparin 700 Units/hr (01/11/19 1818)   piperacillin-tazobactam (ZOSYN)  IV 3.375 g (01/12/19 0537)   PRN Meds: acetaminophen **OR** acetaminophen, ondansetron **OR** ondansetron (ZOFRAN) IV  Allergies:    Allergies  Allergen Reactions   Codeine Rash    Social History:   Social History   Socioeconomic History   Marital status: Widowed    Spouse name: Not on file   Number of children: Not on file    Years of education: Not on file   Highest education level: Not on file  Occupational History   Not on file  Social Needs   Financial resource strain: Not on file   Food insecurity    Worry: Not on file    Inability: Not on file   Transportation needs    Medical: Not on file    Non-medical: Not on file  Tobacco Use   Smoking status: Never Smoker   Smokeless tobacco: Current User    Types: Snuff  Substance and Sexual Activity   Alcohol use: No   Drug use: No   Sexual activity: Not on file  Lifestyle   Physical activity    Days per week: Not on file    Minutes per session: Not on file   Stress: Not on file  Relationships   Social Musician on phone: Not on file    Gets together: Not on file    Attends religious service: Not on file    Active member of club or organization: Not on file    Attends meetings of clubs or organizations: Not on file    Relationship status: Not on file   Intimate partner violence    Fear of current or ex partner: Not on file    Emotionally abused: Not on file    Physically abused: Not on file    Forced sexual activity: Not on file  Other Topics Concern   Not on file  Social History Narrative   Lives at home by herself. Followed by [facility]. Ambulates with a walker.    Family History:    Family History  Problem Relation Age of Onset   Diabetes Mellitus II Mother    Other Father        Tremors     ROS:  Please see the history of present illness.  Review of Systems  Unable to perform ROS: Dementia  Constitutional: Positive for malaise/fatigue.  Cardiovascular: Negative for chest pain, palpitations, orthopnea and leg swelling.  Gastrointestinal: Positive for abdominal pain.  All other systems reviewed and are negative.   All other ROS reviewed and negative.     Physical Exam/Data:   Vitals:   01/11/19 1916 01/11/19 2122 01/12/19 0355 01/12/19 0538  BP:  (!) 157/87 134/69 137/63  Pulse:  88 87 81  Resp:  20   20  Temp:  99.8 F (37.7 C) 99.7 F (37.6 C) 98.7 F (37.1 C)  TempSrc:  Oral Oral Oral  SpO2: 100% 98% 100% 98%  Weight:  59.4 kg    Height:  5' (1.524 m)     No intake or output data in the 24 hours ending 01/12/19 0804 Filed Weights   01/11/19 1305 01/11/19 2122  Weight: 61.2 kg 59.4 kg   Body mass index is 25.56 kg/m.  General:  Frail and elderly woman in NAD HEENT: normal Neck: no JVD Vascular: No carotid bruits; FA pulses 2+ bilaterally without bruits  Cardiac:  normal S1, S2; RRR; no murmur  Lungs:  Poor inspiratory effort, clear to auscultation bilaterally, no wheezing, rhonchi or rales  Abd: soft, nontender, no hepatomegaly  Ext: no edema Musculoskeletal:  No deformities, BUE and BLE strength normal and equal Skin: warm and dry  Neuro: Baseline tremor, CNs 2-12 intact, no focal abnormalities noted Psych:  Normal affect   EKG:  The EKG was personally reviewed and demonstrates:  Initial EKG 54bpm with nonspecific changes. Repeat EKGs SR, anterolateral T wave inversion noted and new compared with initial ED EKG but also present in previous EKGs Telemetry:  Telemetry was personally reviewed and demonstrates:  SR  Relevant CV Studies: Pending echo  Laboratory Data:  Chemistry Recent Labs  Lab 01/11/19 1321 01/12/19 0211  NA 140 144  K 4.3 4.0  CL 105 113*  CO2 24 25  GLUCOSE 164* 121*  BUN 17 18  CREATININE 1.12* 0.88  CALCIUM 8.9 7.8*  GFRNONAA 44* 59*  GFRAA 52* >60  ANIONGAP 11 6    Recent Labs  Lab 01/11/19 1321  PROT 6.2*  ALBUMIN 3.7  AST 26  ALT 10  ALKPHOS 130*  BILITOT 0.5   Hematology Recent Labs  Lab 01/11/19 1321 01/12/19 0211  WBC 10.7* 11.6*  RBC 5.02 4.55  HGB 15.5* 14.1  HCT 47.5* 42.6  MCV 94.6 93.6  MCH 30.9 31.0  MCHC 32.6 33.1  RDW 13.8 14.1  PLT 268 176   Cardiac EnzymesNo results for input(s): TROPONINI in the last 168 hours. No results for input(s): TROPIPOC in the last 168 hours.  BNPNo results for input(s):  BNP, PROBNP in the last 168 hours.  DDimer No results for input(s): DDIMER in the last 168 hours.  Radiology/Studies:  Dg Chest Port 1 View  Result Date: 01/11/2019 CLINICAL DATA:  Sepsis. EXAM: PORTABLE CHEST 1 VIEW COMPARISON:  December 17, 2018 FINDINGS: The heart size is stable. There are persistent airspace opacities at the lung bases bilaterally, left worse than right. These are favored to represent chronic atelectasis or scarring. There is no pneumothorax. No large pleural effusion. No acute osseous abnormality. IMPRESSION: 1. No acute cardiopulmonary process. 2. Persistent bibasilar airspace opacities favored to represent atelectasis. A developing infiltrate is difficult to exclude. Electronically Signed   By: Katherine Mantlehristopher  Green M.D.   On: 01/11/2019 14:13   Ct Angio Abd/pel W And/or Wo Contrast  Result Date: 01/11/2019 CLINICAL DATA:  Lower abdominal pain. Evaluate for mesenteric ischemia. EXAM: CTA ABDOMEN AND PELVIS WITH CONTRAST TECHNIQUE: Multidetector CT imaging of the abdomen and pelvis was performed using the standard protocol during bolus administration of intravenous contrast. Multiplanar reconstructed images and MIPs were obtained and reviewed to evaluate the vascular anatomy. CONTRAST:  75mL OMNIPAQUE IOHEXOL 350 MG/ML SOLN COMPARISON:  CT abdomen and pelvis-12/17/2018 FINDINGS: VASCULAR Aorta: There is a moderate to large amount of eccentric irregular mixed calcified and noncalcified atherosclerotic plaque throughout the normal caliber abdominal aorta, not resulting in a hemodynamically significant stenosis. No evidence of abdominal aortic dissection or periaortic stranding on this nongated examination. Celiac: There is a minimal amount of eccentric mixed calcified and noncalcified atherosclerotic plaque involving the origin the celiac artery, not resulting in a hemodynamically significant stenosis. Conventional branching pattern. SMA: Widely patent without hemodynamically significant  stenosis. Conventional branching pattern. The distal tributaries of the SMA appear widely patent without discrete intraluminal filling defect to suggest distal embolism with special attention paid to the vascular distribution supplying the descending colon. Renals: Solitary bilaterally. There is a minimal amount of eccentric mixed calcified and noncalcified atherosclerotic plaque involving the cranial aspect of the origin of the right renal artery, not definitely resulting in hemodynamically significant stenosis. No vessel irregularity to suggest FMD. IMA: Diseased at its origin though remains widely patent with early collateral supply from the SMA. Inflow: There is a moderate amount of eccentric mixed calcified and noncalcified affecting the bilateral common iliac arteries, not definitely resulting in hemodynamically significant stenosis. The bilateral internal iliac arteries are disease though patent of normal caliber. The bilateral external iliac arteries are widely patent without hemodynamically significant stenosis. Proximal Outflow: There is a moderate amount of eccentric mixed calcified and noncalcified atherosclerotic plaque involving the bilateral common femoral arteries, not definitely resulting in hemodynamically significant stenosis. The imaged aspects of the bilateral deep and superficial femoral arteries are widely patent without hemodynamically significant stenosis. Veins: The IVC and pelvic venous system appear widely patent. Review of the MIP images confirms the above findings. _________________________________________________________ NON-VASCULAR Lower chest: Limited visualization of the lower thorax demonstrates minimal bibasilar ground-glass opacities, favored to represent atelectasis. No discrete focal airspace opacities. No pleural effusion. Borderline cardiomegaly. Trace amount of pericardial fluid, presumably physiologic. Hepatobiliary: Normal hepatic contour. There is a wedge shaped  perfusional abnormality involving the subcapsular aspect of the right lobe of the liver on the arterial phase images (  image 47, series 4), without definitive correlate on the acquired portal venous phase images (image 52, series 8), and without correlate on recent CT scan of the abdomen pelvis performed 12/17/2018 favored to be artifactual due to phase of enhancement. No discrete hepatic lesions. The hepatic portal veins appear widely patent. Post cholecystectomy. The common bile duct is mildly dilated with mild centralized intrahepatic biliary duct dilatation, unchanged, and favored to be sequela of post cholecystectomy state and biliary reservoir phenomena. No ascites. Pancreas: Normal appearance of the pancreas. Spleen: Normal appearance of the spleen. Note is made of a small splenule. Adrenals/Urinary Tract: There is symmetric enhancement of the bilateral kidneys. Unchanged bilateral punctate hypoattenuating renal lesions are too small to accurately characterize though favored to represent renal cysts. No definite renal stones this postcontrast examination. No evidence of urinary obstruction, however there is hyperemia involving the bilateral renal pelves and bilateral ureters. No definitive cortical mottled enhancement to suggest pyelonephritis. While there is no evidence of urinary obstruction, the amount symmetric bilateral perinephric stranding has progressed compared to the 12/17/2018 examination. There is mild thickening of the bilateral adrenal glands without discrete nodule. Note is again made of a tiny (approximately 1.9 x 1.8 cm) diverticulum arising from the left side of the mid body of the urinary bladder (image 189, series 8). Stomach/Bowel: Redemonstrated extensive diverticulosis, primarily involving the sigmoid colon within the left lower abdomen/pelvis. There is nonspecific ill-defined stranding surrounding about the descending colon within the left pericolic gutter (representative images 84, 99,  125 and 137, series 8), progressed compared to the 12/17/2018 examination. No evidence of perforation or definable/drainable fluid collection. Moderate-sized hiatal hernia. Large colonic stool burden without definitive evidence of enteric obstruction. Normal appearance of the terminal ileum. The appendix is not visualized compatible provided operative history. No pneumoperitoneum, pneumatosis or portal venous gas. Lymphatic: No bulky retroperitoneal mesenteric, pelvic or inguinal lymphadenopathy. Reproductive: Post hysterectomy. No discrete adnexal lesion. No free fluid the pelvic cul-de-sac. Other: Minimal amount of subcutaneous edema about the midline of the low back. Musculoskeletal: No acute or aggressive osseous abnormalities. Moderate to severe multilevel lumbar spine DDD, worse at L3-L4 with disc space height loss, endplate irregularity and sclerosis. Post right total hip replacement, incompletely evaluated but without evidence of hardware failure or loosening. IMPRESSION: VASCULAR 1. Moderate to large amount of atherosclerotic plaque within normal caliber abdominal aorta, not resulting in a hemodynamically significant stenosis. Aortic Atherosclerosis (ICD10-I70.0). 2. No CT evidence of mesenteric ischemia. NON-VASCULAR 1. Extensive diverticulosis involving the sigmoid colon within the left lower abdomen/pelvis. 2. Nonspecific rather diffuse stranding about the descending colon without evidence of enteric obstruction, perforation or definable/drainable fluid collection - findings are nonspecific though could be seen in the setting of a colitis, though conceivably uncomplicated diverticulitis could have a similar appearance. Clinical correlation is advised. 3. Hyperemia about the bilateral renal pelves and ureters with associated worsening bilateral perinephric stranding but without evidence of urinary obstruction - findings are nonspecific though could be seen in the setting of a urinary tract infection.  Currently, there is no CT evidence of pyelonephritis. Correlation with urinalysis is advised. 4. Moderate-sized hiatal hernia. Critical Value/emergent results were called by telephone at the time of interpretation on 01/11/2019 at 5:20 pm to Dr. Roxan Hockeyobinson who verbally acknowledged these results. Electronically Signed   By: Simonne ComeJohn  Watts M.D.   On: 01/11/2019 17:32    Assessment and Plan:   Elevated troponin without Chest Pain --No chest pain. No history of cardiac disease on review of EMR. Risk  factors for cardiac etiology of Tn elevation include HTN, HLD, and age.  --HS troponin peaked at 287 (troponin ~0.287)  and down-trending in the setting of diverticulitis / colitis / UTI / sepsis. Likely supply demand ischemia in the setting of sepsis / infection but cannot completely rule out cardiac etiology at this time given risk factors and unclear HPI/PMH 2/2 patient dementia.  --Several EKGs performed in ED and showing signs of ischemia / t wave inversion in anterolateral leads but without acute changes from past EKGs. --Will check A1C, lipid panel to further risk stratify given limited PMH --BB and ACE on hold as below and in the setting of sepsis. Continue to hold for now until vitals stable to avoid pre-renal injury. On heparin per IM. If further workup unrevealing, can likely discontinue heparin drip at that time and place on DVT prophylaxis. --Echo pending to assess for significant reduction in EF or structural abnormalities. In room at time of echo with initial reading not concerning for acute findings. Further recommendations pending these results. Patient is not an ideal candidate for invasive ischemic workup including cardiac catheterization at this time and with preference to treat infection / resolve sepsis. Once patient is stable and infection resolved, and if c/o chest pain or concerning findings in the future, could consider non-invasive Lexiscan stress test. Will continue to monitor for  now.  Sepsis / Diverticulitis / UTI --CT without evidence of mesenteric ischemia and showing extensive diverticulosis and diffuse stranding of the descending colon without evidence of enteric obstruction --Per IM, surgery team. On abx. No current procedures planned at this time  HTN --BP has been labile thus far this admission and in the setting of sepsis. If consistently elevated, will restart home medications. However, given earlier hypotension and bradycardia, will monitor for now. Of note, PTA atenolol and lisinopril currently on hold in the setting of her earlier hypotension.  HLD --Continue statin therapy. Pending lipid panel.  Hypothyroidism --Last TSH 01/04/2016 3.111.  --Will recheck TSH for further risk stratification. --Continue synthroid   For questions or updates, please contact [facility] Please consult [email or web address] for contact info under     Signed, Lennon Alstrom, PA-C  01/12/2019 8:04 AM

## 2019-01-12 NOTE — Progress Notes (Signed)
Call the patient daughter 2 times on her cellphone and 2 times on her house phone without answers. Nurse will call later again.

## 2019-01-12 NOTE — Progress Notes (Signed)
Updated daughter Ms. Carloyn Jaeger over the phone and explained the possibility of discharge in 1 to 2 days. Discussed elevated troponin and diverticulitis and the care plan

## 2019-01-12 NOTE — Consult Note (Signed)
[facility] SURGICAL CONSULTATION NOTE (initial) - cpt: [id number]: [id number]   HISTORY OF PRESENT ILLNESS (HPI):  Patient has a history of dementia which makes obtaining history difficult. A majority of the history was obtained through chart review and discussion with medical team.  85 y.o. female [facility] ED via EMS yesterday (07/01) for evaluation of abdominal pain. Reports of acute onset severe abdominal pain the morning of presentation to the hospital. The pain was localized as diffuse. She was reporting associated nausea. No fever, chills, emesis or diarrhea. Further history difficult to assess. She was hospitalized from 06/06-06/08 with acute sigmoid diverticulitis which responded to ABx management. However, she is unable to recall this admission. Previous abdominal surgeries positive for appendectomy, cholecystectomy, and abdominal hysterectomy. Work up in the ED was concerning for mild leukocytosis, lactic acidosis, elevated high-sensitivity troponin without ischemia on EKG, and diverticulitis vs colitis on CT. She was admitted to the medicine service.    Surgery is consulted by hospitalist physician Dr. Cherlynn KaiserSainani, MD in this context for evaluation and management of recurrent diverticulitis.    PAST MEDICAL HISTORY (PMH):  Past Medical History:  Diagnosis Date  . Dementia (HCC)   . Depression   . Fatigue   . Femur fracture, right (HCC)   . GERD (gastroesophageal reflux disease)   . Hyperlipidemia   . Hypertension   . Hypothyroidism   . Osteoarthritis   . Tremor    benign essential tremor- tremors of voice, head and jaw  . Weakness      PAST SURGICAL HISTORY (PSH):  Past Surgical History:  Procedure Laterality Date  . ABDOMINAL HYSTERECTOMY    . APPENDECTOMY    . CATARACT EXTRACTION W/PHACO Right 11/02/2017   Procedure: CATARACT EXTRACTION PHACO AND INTRAOCULAR LENS PLACEMENT (IOC);  Surgeon: Galen ManilaPorfilio, William, MD;  Location: [facility] ORS;  Service: Ophthalmology;   Laterality: Right;  US 00:37 AP% 15.0 CDE 5.58 Fluid pack lot # [id number]  . CATARACT EXTRACTION W/PHACO Left 12/07/2017   Procedure: CATARACT EXTRACTION PHACO AND INTRAOCULAR LENS PLACEMENT (IOC);  Surgeon: Galen ManilaPorfilio, William, MD;  Location: [facility] ORS;  Service: Ophthalmology;  Laterality: Left;  US 00:34 AP% 14.2 CDE 4.88 Fluid pack lot # [id number]  . CHOLECYSTECTOMY    . THYROIDECTOMY, PARTIAL    . TOTAL HIP ARTHROPLASTY Right 05/24/2015   Procedure: TOTAL HIP ARTHROPLASTY ANTERIOR APPROACH;  Surgeon: Kennedy BuckerMichael Menz, MD;  Location: [facility] ORS;  Service: Orthopedics;  Laterality: Right;     MEDICATIONS:  Prior to Admission medications   Medication Sig Start Date End Date Taking? Authorizing Provider  acetaminophen (TYLENOL) 500 MG tablet Take 500-1,000 mg by mouth every 6 (six) hours as needed.   Yes [provider]  atenolol (TENORMIN) 50 MG tablet Take 50 mg by mouth daily.   Yes [provider]  carbidopa-levodopa (SINEMET IR) 10-100 MG tablet Take 1 tablet by mouth 2 (two) times daily.   Yes [provider]  cholecalciferol (VITAMIN D3) 25 MCG (1000 UT) tablet Take 1,000 Units by mouth daily.   Yes [provider]  ketotifen (ZADITOR) 0.025 % ophthalmic solution Place 1 drop into both eyes daily.   Yes [provider]  levothyroxine (SYNTHROID, LEVOTHROID) 100 MCG tablet Take 100 mcg by mouth daily.   Yes [provider]  lidocaine (LIDODERM) 5 % Place 1 patch onto the skin daily. Remove & Discard patch within 12 hours or as directed by MD   Yes [provider]  lisinopril (ZESTRIL) 5 MG tablet Take 7.5  mg by mouth daily.    Yes [provider]  loratadine (CLARITIN) 10 MG tablet Take 10 mg by mouth daily.   Yes [provider]  mometasone (NASONEX) 50 MCG/ACT nasal spray Place 2 sprays into the nose daily.   Yes [provider]  Nutritional Supplements (ENSURE PLUS PO) Take 237 mLs by mouth 2 (two)  times a day.   Yes [provider]  pravastatin (PRAVACHOL) 20 MG tablet Take 20 mg by mouth at bedtime.   Yes [provider]  primidone (MYSOLINE) 250 MG tablet Take 250 mg by mouth at bedtime.    Yes [provider]  primidone (MYSOLINE) 50 MG tablet Take 50-100 mg by mouth 2 (two) times a day. Give 100 mg in the morning, and 50 mg at noon. Give noon dose at pace on center days   Yes [provider]  trospium (SANCTURA) 20 MG tablet Take 20 mg by mouth daily.   Yes [provider]     ALLERGIES:  Allergies  Allergen Reactions  . Codeine Rash     SOCIAL HISTORY:  Social History   Socioeconomic History  . Marital status: Widowed    Spouse name: Not on file  . Number of children: Not on file  . Years of education: Not on file  . Highest education level: Not on file  Occupational History  . Not on file  Social Needs  . Financial resource strain: Not on file  . Food insecurity    Worry: Not on file    Inability: Not on file  . Transportation needs    Medical: Not on file    Non-medical: Not on file  Tobacco Use  . Smoking status: Never Smoker  . Smokeless tobacco: Current User    Types: Snuff  Substance and Sexual Activity  . Alcohol use: No  . Drug use: No  . Sexual activity: Not on file  Lifestyle  . Physical activity    Days per week: Not on file    Minutes per session: Not on file  . Stress: Not on file  Relationships  . Social Musicianconnections    Talks on phone: Not on file    Gets together: Not on file    Attends religious service: Not on file    Active member of club or organization: Not on file    Attends meetings of clubs or organizations: Not on file    Relationship status: Not on file  . Intimate partner violence    Fear of current or ex partner: Not on file    Emotionally abused: Not on file    Physically abused: Not on file    Forced sexual activity: Not on file  Other Topics Concern  . Not on file  Social  History Narrative   Lives at home by herself. [facility]ed by [facility]. Ambulates with a walker.     FAMILY HISTORY:  Family History  Problem Relation Age of Onset  . Diabetes Mellitus II Mother   . Other Father        Tremors      REVIEW OF SYSTEMS:  Review of Systems  Unable to perform ROS: Dementia  Gastrointestinal: Positive for abdominal pain and nausea.    VITAL SIGNS:  Temp:  [97.5 F (36.4 C)-99.8 F (37.7 C)] 98.7 F (37.1 C) (07/02 0538) Pulse Rate:  [52-89] 81 (07/02 0538) Resp:  [13-20] 20 (07/02 0538) BP: (56-169)/(35-102) 137/63 (07/02 0538) SpO2:  [92 %-100 %] 98 % (  07/02 0538) Weight:  [59.4 kg-61.2 kg] 59.4 kg (07/01 2122)     Height: 5' (152.4 cm) Weight: 59.4 kg BMI (Calculated): 25.56   INTAKE/OUTPUT:  No intake/output data recorded.  PHYSICAL EXAM:  Physical Exam Vitals signs and nursing note reviewed.  Constitutional:      General: She is not in acute distress.    Appearance: She is well-developed. She is not ill-appearing.  HENT:     Head: Normocephalic and atraumatic.  Eyes:     General: No scleral icterus.    Extraocular Movements: Extraocular movements intact.  Cardiovascular:     Rate and Rhythm: Normal rate and regular rhythm.     Heart sounds: Normal heart sounds. No murmur. No friction rub. No gallop.   Pulmonary:     Effort: Pulmonary effort is normal. No respiratory distress.  Abdominal:     General: Abdomen is protuberant. A surgical scar is present. There is no distension.     Palpations: Abdomen is soft.     Tenderness: There is abdominal tenderness in the periumbilical area. There is no guarding or rebound.     Comments: Somewhat diffuse abdominal pain appears worse in the umbilical region  Genitourinary:    Comments: Deferred Skin:    General: Skin is warm and dry.  Neurological:     General: No focal deficit present.     Mental Status: She is alert.  Psychiatric:        Mood and Affect: Mood normal.        Behavior: Behavior  normal.      Labs:  CBC Latest Ref Rng & Units 01/12/2019 01/11/2019 12/18/2018  WBC 4.0 - 10.5 K/uL 11.6(H) 10.7(H) 10.4  Hemoglobin 12.0 - 15.0 g/dL 14.1 15.5(H) 14.4  Hematocrit 36.0 - 46.0 % 42.6 47.5(H) 43.8  Platelets 150 - 400 K/uL 176 268 183   CMP Latest Ref Rng & Units 01/12/2019 01/11/2019 12/17/2018  Glucose 70 - 99 mg/dL 121(H) 164(H) 140(H)  BUN 8 - 23 mg/dL 18 17 19   Creatinine 0.44 - 1.00 mg/dL 0.88 1.12(H) 0.88  Sodium 135 - 145 mmol/L 144 140 141  Potassium 3.5 - 5.1 mmol/L 4.0 4.3 4.1  Chloride 98 - 111 mmol/L 113(H) 105 109  CO2 22 - 32 mmol/L 25 24 23   Calcium 8.9 - 10.3 mg/dL 7.8(L) 8.9 8.7(L)  Total Protein 6.5 - 8.1 g/dL - 6.2(L) 6.4(L)  Total Bilirubin 0.3 - 1.2 mg/dL - 0.5 0.6  Alkaline Phos 38 - 126 U/L - 130(H) 92  AST 15 - 41 U/L - 26 24  ALT 0 - 44 U/L - 10 5     Imaging studies:   CT Abdomen/Pelvis (12/17/2018) personally reviewed and radiologist report reviewed and compared to most recent study:  IMPRESSION: 1. There is severe sigmoid diverticulosis and inflammatory thickening about the proximal to mid sigmoid. Findings are consistent with acute diverticulitis. No evidence of perforation or abscess. 2. Trace nonspecific free fluid in the low abdomen and pelvis, likely reactive. 3. Other chronic, incidental, and postoperative findings as detailed Above.   CTA Abdomen/Pelvis (01/11/2019) personally reviewed and radiologist report reviewed and compared to previous study IMPRESSION:   VASCULAR 1. Moderate to large amount of atherosclerotic plaque within normal caliber abdominal aorta, not resulting in a hemodynamically significant stenosis. Aortic Atherosclerosis (ICD10-I70.0). 2. No CT evidence of mesenteric ischemia.  NON-VASCULAR 1. Extensive diverticulosis involving the sigmoid colon within the left lower abdomen/pelvis. 2. Nonspecific rather diffuse stranding about the descending colon without evidence of  enteric obstruction, perforation  or definable/drainable fluid collection - findings are nonspecific though could be seen in the setting of a colitis, though conceivably uncomplicated diverticulitis could have a similar appearance. Clinical correlation is advised. 3. Hyperemia about the bilateral renal pelves and ureters with associated worsening bilateral perinephric stranding but without evidence of urinary obstruction - findings are nonspecific though could be seen in the setting of a urinary tract infection. Currently, there is no CT evidence of pyelonephritis. Correlation with urinalysis is advised. 4. Moderate-sized hiatal hernia.    Assessment/Plan: (ICD-10's: 23K57.92) 85 y.o. female with abdominal pain and leukocytosis most likely attributable to recurrent diverticulitis vs descending colitis, although this is complicated by elevated high-sensitivity troponin without ischemia on EKG currently on empiric heparin, which is further complicated by pertinent comorbidities including Parkinson's Dementia, HLD, HTN, Hypothyroidism, and advanced age.     - NPO for now for bowel rest + IVF; if pain improves can consider diet initiation   - Continue IV ABx (Zosyn); recommend Augmentin PO when able to discharge as she has failed Cipro/Flagyl outpatient  - Will check C diff given diarrhea and question of colitis on imaging  - pain control prn; antiemetics prn   - Monitor abdominal examination; on-going bowel function   - No indication for emergent surgical intervention; given comorbidities she is poor surgical candidate   - monitor leukocytosis  - Agree with cardiology consultation  - Further management per primary team; will follow  All of the above findings and recommendations were discussed with the patient, and the medical team  Thank you for the opportunity to participate in this patient's care.   -- Lynden OxfordZachary Medea Deines, PA-C [facility] 01/12/2019, 7:31 AM [phone] M-F: 7am - 4pm

## 2019-01-12 NOTE — Consult Note (Signed)
ANTICOAGULATION CONSULT NOTE - Initial Consult  Pharmacy Consult for Heparin Indication: chest pain/ACS  Allergies  Allergen Reactions  . Codeine Rash    Patient Measurements: Height: 5' (152.4 cm) Weight: 130 lb 14.4 oz (59.4 kg) IBW/kg (Calculated) : 45.5 Heparin Dosing Weight: 61.2 kg  Vital Signs: Temp: 99.7 F (37.6 C) (07/02 0355) Temp Source: Oral (07/02 0355) BP: 134/69 (07/02 0355) Pulse Rate: 87 (07/02 0355)  Labs: Recent Labs    01/11/19 1321 01/11/19 1628 01/11/19 2133 01/11/19 2330 01/12/19 0211  HGB 15.5*  --   --   --  14.1  HCT 47.5*  --   --   --  42.6  PLT 268  --   --   --  176  APTT  --   --  55*  --   --   LABPROT 12.8  --   --   --   --   INR 1.0  --   --   --   --   HEPARINUNFRC  --   --   --   --  0.34  CREATININE 1.12*  --   --   --  0.88  TROPONINIHS 3 265*  --  287* 234*    Estimated Creatinine Clearance: 37 mL/min (by C-G formula based on SCr of 0.88 mg/dL).   Medical History: Past Medical History:  Diagnosis Date  . Dementia (HCC)   . Depression   . Fatigue   . Femur fracture, right (HCC)   . GERD (gastroesophageal reflux disease)   . Hyperlipidemia   . Hypertension   . Hypothyroidism   . Osteoarthritis   . Tremor    benign essential tremor- tremors of voice, head and jaw  . Weakness     Medications:  Medications Prior to Admission  Medication Sig Dispense Refill Last Dose  . acetaminophen (TYLENOL) 500 MG tablet Take 500-1,000 mg by mouth every 6 (six) hours as needed.   prn at prn  . atenolol (TENORMIN) 50 MG tablet Take 50 mg by mouth daily.   01/11/2019 at 0930  . carbidopa-levodopa (SINEMET IR) 10-100 MG tablet Take 1 tablet by mouth 2 (two) times daily.   01/11/2019 at 0930  . cholecalciferol (VITAMIN D3) 25 MCG (1000 UT) tablet Take 1,000 Units by mouth daily.   01/11/2019 at 0930  . ketotifen (ZADITOR) 0.025 % ophthalmic solution Place 1 drop into both eyes daily.   01/11/2019 at 0930  . levothyroxine (SYNTHROID,  LEVOTHROID) 100 MCG tablet Take 100 mcg by mouth daily.   01/11/2019 at 0930  . lidocaine (LIDODERM) 5 % Place 1 patch onto the skin daily. Remove & Discard patch within 12 hours or as directed by MD   unknown at unknown  . lisinopril (ZESTRIL) 5 MG tablet Take 7.5 mg by mouth daily.    01/11/2019 at 0930  . loratadine (CLARITIN) 10 MG tablet Take 10 mg by mouth daily.   01/11/2019 at 0930  . mometasone (NASONEX) 50 MCG/ACT nasal spray Place 2 sprays into the nose daily.   01/11/2019 at 0930  . Nutritional Supplements (ENSURE PLUS PO) Take 237 mLs by mouth 2 (two) times a day.   01/10/2019 at Unknown time  . pravastatin (PRAVACHOL) 20 MG tablet Take 20 mg by mouth at bedtime.   01/10/2019 at Unknown time  . primidone (MYSOLINE) 250 MG tablet Take 250 mg by mouth at bedtime.    01/10/2019 at Unknown time  . primidone (MYSOLINE) 50 MG tablet Take 50-100 mg  by mouth 2 (two) times a day. Give 100 mg in the morning, and 50 mg at noon. Give noon dose at pace on center days   01/11/2019 at 0930  . trospium (SANCTURA) 20 MG tablet Take 20 mg by mouth daily.   01/11/2019 at 0930   Scheduled:  . carbidopa-levodopa  1 tablet Oral BID  . cholecalciferol  1,000 Units Oral Daily  . darifenacin  7.5 mg Oral Daily  . ketotifen  1 drop Both Eyes Daily  . levothyroxine  100 mcg Oral Daily  . lidocaine  1 patch Transdermal Q24H  . loratadine  10 mg Oral Daily  . pravastatin  20 mg Oral QHS  . primidone  100 mg Oral BH-q7a   And  . primidone  50 mg Oral Q1200  . primidone  250 mg Oral QHS   Infusions:  . sodium chloride 100 mL/hr at 01/12/19 0029  . heparin 700 Units/hr (01/11/19 1818)  . piperacillin-tazobactam (ZOSYN)  IV 3.375 g (01/12/19 0033)   PRN:   Assessment: Pharmacy has been consulted to start Heparin Drip on 85yo patient. Patient has no prior history of DOAC use, so will initiate therapy immediately. Baseline INR and APTT have been ordered and collected.   Goal of Therapy:  Heparin level 0.3-0.7  units/ml Monitor platelets by anticoagulation protocol: Yes   Plan:  07/02 @ 0200 HL 0.34 therapeutic. Will continue current rate and will recheck HL @ 1000 CBC stable will continue to monitor.  Tobie Lords, PharmD Clinical Pharmacist 01/12/2019 3:56 AM

## 2019-01-12 NOTE — Consult Note (Signed)
ANTICOAGULATION CONSULT NOTE - Initial Consult  Pharmacy Consult for Heparin Indication: chest pain/ACS  Allergies  Allergen Reactions  . Codeine Rash    Patient Measurements: Height: 5' (152.4 cm) Weight: 130 lb 14.4 oz (59.4 kg) IBW/kg (Calculated) : 45.5 Heparin Dosing Weight: 61.2 kg  Vital Signs: Temp: 98.7 F (37.1 C) (07/02 0538) Temp Source: Oral (07/02 0538) BP: 137/63 (07/02 0538) Pulse Rate: 81 (07/02 0538)  Labs: Recent Labs    01/11/19 1321 01/11/19 1628 01/11/19 2133 01/11/19 2330 01/12/19 0211  HGB 15.5*  --   --   --  14.1  HCT 47.5*  --   --   --  42.6  PLT 268  --   --   --  176  APTT  --   --  55*  --   --   LABPROT 12.8  --   --   --   --   INR 1.0  --   --   --   --   HEPARINUNFRC  --   --   --   --  0.34  CREATININE 1.12*  --   --   --  0.88  TROPONINIHS 3 265*  --  287* 234*    Estimated Creatinine Clearance: 37 mL/min (by C-G formula based on SCr of 0.88 mg/dL).   Medical History: Past Medical History:  Diagnosis Date  . Dementia (HCC)   . Depression   . Fatigue   . Femur fracture, right (HCC)   . GERD (gastroesophageal reflux disease)   . Hyperlipidemia   . Hypertension   . Hypothyroidism   . Osteoarthritis   . Tremor    benign essential tremor- tremors of voice, head and jaw  . Weakness     Medications:  Medications Prior to Admission  Medication Sig Dispense Refill Last Dose  . acetaminophen (TYLENOL) 500 MG tablet Take 500-1,000 mg by mouth every 6 (six) hours as needed.   prn at prn  . atenolol (TENORMIN) 50 MG tablet Take 50 mg by mouth daily.   01/11/2019 at 0930  . carbidopa-levodopa (SINEMET IR) 10-100 MG tablet Take 1 tablet by mouth 2 (two) times daily.   01/11/2019 at 0930  . cholecalciferol (VITAMIN D3) 25 MCG (1000 UT) tablet Take 1,000 Units by mouth daily.   01/11/2019 at 0930  . ketotifen (ZADITOR) 0.025 % ophthalmic solution Place 1 drop into both eyes daily.   01/11/2019 at 0930  . levothyroxine (SYNTHROID,  LEVOTHROID) 100 MCG tablet Take 100 mcg by mouth daily.   01/11/2019 at 0930  . lidocaine (LIDODERM) 5 % Place 1 patch onto the skin daily. Remove & Discard patch within 12 hours or as directed by MD   unknown at unknown  . lisinopril (ZESTRIL) 5 MG tablet Take 7.5 mg by mouth daily.    01/11/2019 at 0930  . loratadine (CLARITIN) 10 MG tablet Take 10 mg by mouth daily.   01/11/2019 at 0930  . mometasone (NASONEX) 50 MCG/ACT nasal spray Place 2 sprays into the nose daily.   01/11/2019 at 0930  . Nutritional Supplements (ENSURE PLUS PO) Take 237 mLs by mouth 2 (two) times a day.   01/10/2019 at Unknown time  . pravastatin (PRAVACHOL) 20 MG tablet Take 20 mg by mouth at bedtime.   01/10/2019 at Unknown time  . primidone (MYSOLINE) 250 MG tablet Take 250 mg by mouth at bedtime.    01/10/2019 at Unknown time  . primidone (MYSOLINE) 50 MG tablet Take 50-100 mg  by mouth 2 (two) times a day. Give 100 mg in the morning, and 50 mg at noon. Give noon dose at pace on center days   01/11/2019 at 0930  . trospium (SANCTURA) 20 MG tablet Take 20 mg by mouth daily.   01/11/2019 at 0930   Scheduled:  . carbidopa-levodopa  1 tablet Oral BID  . cholecalciferol  1,000 Units Oral Daily  . darifenacin  7.5 mg Oral Daily  . ketotifen  1 drop Both Eyes Daily  . levothyroxine  100 mcg Oral Daily  . lidocaine  1 patch Transdermal Q24H  . loratadine  10 mg Oral Daily  . pravastatin  20 mg Oral QHS  . primidone  100 mg Oral BH-q7a   And  . primidone  50 mg Oral Q1200  . primidone  250 mg Oral QHS   Infusions:  . sodium chloride 100 mL/hr at 01/12/19 0029  . heparin 700 Units/hr (01/11/19 1818)  . piperacillin-tazobactam (ZOSYN)  IV 3.375 g (01/12/19 0537)   PRN:   Assessment: Pharmacy has been consulted to start Heparin Drip on 85yo patient. Patient has no prior history of DOAC use, so will initiate therapy immediately. Baseline INR and APTT have been ordered and collected.  07/02 @ 0200 HL 0.34 therapeutic and rate was not  changed.  07/02 @ 1023 HL 0.52 therapeutic x2.   Goal of Therapy:  Heparin level 0.3-0.7 units/ml Monitor platelets by anticoagulation protocol: Yes   Plan:  Will continue current rate and will recheck HL with AM labs. CBC stable will continue to monitor.  Rowland Lathe, PharmD Clinical Pharmacist 01/12/2019 9:18 AM

## 2019-01-12 NOTE — TOC Initial Note (Signed)
Transition of Care [facility]) - Initial/Assessment Note    Patient Details  Name: Brandy Oconnor MRN: [id number] Date of Birth: Jul 19, 1930  [facility] CM/SW Contact:    Beverly Sessions, RN Phone Number: 01/12/2019, 1:58 PM  Clinical Narrative:                 Patient admitted from home with diverticulitis.  Patient with history of dementia.  Assessment completed with daughter Ms [name] on the phone    Lives at home with daughter.  Daughter states she is with her 24/7, and still followed [facility].  Due to the corona virus patient has still been unable to do to the day program.   PCP Rande Lawman.  [facility] provides typically transportation.    Patient has a RW, hospital bed, and BSC in the home.   RNCM notified Dr Rolena Infante nurse Bartolo Darter of admission and provided clinical update.  Per Bartolo Darter [facility] will be closed July 3rd, 4th, and 5th so family would need to provide transportation at discharge if discharged on one of these dates.    Expected Discharge Plan: Home/Self Care([facility]) Barriers to Discharge: Continued Medical Work up   Patient Goals and CMS Choice        Expected Discharge Plan and Services Expected Discharge Plan: Home/Self Care([facility])       Living arrangements for the past 2 months: [place]                                      Prior Living Arrangements/Services Living arrangements for the past 2 months: [place] Lives with:: Adult Children Patient language and need for interpreter reviewed:: Yes        Need for Family Participation in Patient Care: Yes (Comment) Care giver support system in place?: Yes (comment) Current home services: Other (comment)([facility]) Criminal Activity/Legal Involvement Pertinent to Current Situation/Hospitalization: No - Comment as needed  Activities of Daily Living Home Assistive Devices/Equipment: Walker (specify type) ADL Screening (condition at time of admission) Patient's cognitive  ability adequate to safely complete daily activities?: No Is the patient deaf or have difficulty hearing?: No Does the patient have difficulty seeing, even when wearing glasses/contacts?: No Does the patient have difficulty concentrating, remembering, or making decisions?: Yes Patient able to express need for assistance with ADLs?: Yes Does the patient have difficulty dressing or bathing?: Yes Independently performs ADLs?: No Communication: Needs assistance Is this a change from baseline?: Pre-admission baseline Dressing (OT): Needs assistance Is this a change from baseline?: Pre-admission baseline Grooming: Needs assistance Is this a change from baseline?: Pre-admission baseline Feeding: Independent Bathing: Needs assistance Is this a change from baseline?: Pre-admission baseline Toileting: Needs assistance Is this a change from baseline?: Pre-admission baseline In/Out Bed: Needs assistance Is this a change from baseline?: Pre-admission baseline Walks in Home: Needs assistance Is this a change from baseline?: Pre-admission baseline Does the patient have difficulty walking or climbing stairs?: Yes Weakness of Legs: Both Weakness of Arms/Hands: Both  Permission Sought/Granted                  Emotional Assessment           Psych Involvement: No (comment)  Admission diagnosis:  Lactic acidosis [E87.2] Generalized abdominal pain [R10.84] Diverticulitis of large intestine without perforation or abscess without bleeding [K57.32] Sepsis, due to unspecified organism, unspecified whether acute organ dysfunction present [facility]) [A41.9] Patient Active  Problem List   Diagnosis Date Noted  . Diverticulitis 12/17/2018  . Encephalopathy acute 01/04/2016  . Hip fracture (HCC) 05/23/2015   PCP:  Patient, No Pcp Per Pharmacy:   [facility] [place] [address] [place] [address] Phone: [phone] Fax:  [phone]     Social Determinants of Health (SDOH) Interventions    Readmission Risk Interventions No flowsheet data found.

## 2019-01-12 NOTE — Plan of Care (Addendum)
Echocardiogram performed. Cardiology has seen the patient. EKG performed. Heparin drip was stopped. Diet changed to clears and being tolerated. PT order for the patient tomorrow. IV fluids running. IV antibiotics provided. Patient is incontinent of bowels and urine. C.Diff result is negative. The patient's daughter was updated by Dr. Tressia Miners and the nurse.  Problem: Education: Goal: Knowledge of General Education information will improve Description: Including pain rating scale, medication(s)/side effects and non-pharmacologic comfort measures Outcome: Progressing   Problem: Health Behavior/Discharge Planning: Goal: Ability to manage health-related needs will improve Outcome: Progressing   Problem: Clinical Measurements: Goal: Ability to maintain clinical measurements within normal limits will improve Outcome: Progressing Goal: Will remain free from infection Outcome: Progressing Goal: Diagnostic test results will improve Outcome: Progressing Goal: Respiratory complications will improve Outcome: Progressing Goal: Cardiovascular complication will be avoided Outcome: Progressing   Problem: Activity: Goal: Risk for activity intolerance will decrease Outcome: Progressing   Problem: Nutrition: Goal: Adequate nutrition will be maintained Outcome: Progressing   Problem: Coping: Goal: Level of anxiety will decrease Outcome: Progressing   Problem: Elimination: Goal: Will not experience complications related to bowel motility Outcome: Progressing Goal: Will not experience complications related to urinary retention Outcome: Progressing   Problem: Pain Managment: Goal: General experience of comfort will improve Outcome: Progressing   Problem: Safety: Goal: Ability to remain free from injury will improve Outcome: Progressing   Problem: Skin Integrity: Goal: Risk for impaired skin integrity will decrease Outcome: Progressing

## 2019-01-12 NOTE — Progress Notes (Signed)
MD notified: FYI, troponin level is slowly decreasing last night troponin level was 287, and today it is 234 with the heparin drip that was started. Calcium level is also 7.8 this morning.

## 2019-01-12 NOTE — Care Management Important Message (Signed)
Important Message  Patient Details  Name: Brandy Oconnor MRN: [id number] Date of Birth: [birth year]   Medicare Important Message Given:  Yes  Initial Medicare IM given by Patient Access Associate on 01/11/2019 at 6:50pm. Still valid.   Dannette Barbara 01/12/2019, 2:59 PM

## 2019-01-12 NOTE — Progress Notes (Signed)
[employer] [facility] [place] [facility]   PATIENT NAME: Nicoletta DressSarah Motta    MR#:  [id number]  DATE OF BIRTH:  1930/08/11  SUBJECTIVE:  CHIEF COMPLAINT:   Chief Complaint  Patient presents with  . Abdominal Pain   -Patient is pleasantly confused.  Denies any symptoms at this time  REVIEW OF SYSTEMS:  Review of Systems  Constitutional: Negative for chills, fever and malaise/fatigue.  Eyes: Negative for blurred vision and double vision.  Respiratory: Negative for cough, shortness of breath and wheezing.   Cardiovascular: Negative for chest pain and palpitations.  Gastrointestinal: Positive for abdominal pain. Negative for constipation, diarrhea, nausea and vomiting.  Genitourinary: Negative for dysuria.  Neurological: Negative for dizziness, seizures and headaches.    DRUG ALLERGIES:   Allergies  Allergen Reactions  . Codeine Rash    VITALS:  Blood pressure 137/63, pulse 81, temperature 98.7 F (37.1 C), temperature source Oral, resp. rate 20, height 5' (1.524 m), weight 59.4 kg, SpO2 98 %.  PHYSICAL EXAMINATION:  Physical Exam   GENERAL:  85 y.o.-year-old elderly patient lying in the bed with no acute distress.  EYES: Pupils equal, round, reactive to light and accommodation. No scleral icterus. Extraocular muscles intact.  HEENT: Head atraumatic, normocephalic. Oropharynx and nasopharynx clear.  NECK:  Supple, no jugular venous distention. No thyroid enlargement, no tenderness.  LUNGS: Normal breath sounds bilaterally, no wheezing, rales,rhonchi or crepitation. No use of accessory muscles of respiration. Decreased bibasilar breath sounds CARDIOVASCULAR: S1, S2 normal. No  rubs, or gallops. 2/6 systolic murmur present ABDOMEN: Soft, nontender, nondistended. Bowel sounds present. No organomegaly or mass.  EXTREMITIES: No pedal edema, cyanosis, or clubbing.  NEUROLOGIC: Cranial nerves II through XII are intact. Muscle strength equal in all extremities. Sensation  intact. Gait not checked. Global weakness noted. PSYCHIATRIC: The patient is alert and oriented to self SKIN: No obvious rash, lesion, or ulcer. Several skin tags noted around her neck   LABORATORY PANEL:   CBC Recent Labs  Lab 01/12/19 0211  WBC 11.6*  HGB 14.1  HCT 42.6  PLT 176   ------------------------------------------------------------------------------------------------------------------  Chemistries  Recent Labs  Lab 01/11/19 1321 01/12/19 0211  NA 140 144  K 4.3 4.0  CL 105 113*  CO2 24 25  GLUCOSE 164* 121*  BUN 17 18  CREATININE 1.12* 0.88  CALCIUM 8.9 7.8*  AST 26  --   ALT 10  --   ALKPHOS 130*  --   BILITOT 0.5  --    ------------------------------------------------------------------------------------------------------------------  Cardiac Enzymes No results for input(s): TROPONINI in the last 168 hours. ------------------------------------------------------------------------------------------------------------------  RADIOLOGY:  Dg Chest Port 1 View  Result Date: 01/11/2019 CLINICAL DATA:  Sepsis. EXAM: PORTABLE CHEST 1 VIEW COMPARISON:  December 17, 2018 FINDINGS: The heart size is stable. There are persistent airspace opacities at the lung bases bilaterally, left worse than right. These are favored to represent chronic atelectasis or scarring. There is no pneumothorax. No large pleural effusion. No acute osseous abnormality. IMPRESSION: 1. No acute cardiopulmonary process. 2. Persistent bibasilar airspace opacities favored to represent atelectasis. A developing infiltrate is difficult to exclude. Electronically Signed   By: Katherine Mantlehristopher  Green M.D.   On: 01/11/2019 14:13   Ct Angio Abd/pel W And/or Wo Contrast  Result Date: 01/11/2019 CLINICAL DATA:  Lower abdominal pain. Evaluate for mesenteric ischemia. EXAM: CTA ABDOMEN AND PELVIS WITH CONTRAST TECHNIQUE: Multidetector CT imaging of the abdomen and pelvis was performed using the standard protocol during  bolus administration of intravenous  contrast. Multiplanar reconstructed images and MIPs were obtained and reviewed to evaluate the vascular anatomy. CONTRAST:  33mL OMNIPAQUE IOHEXOL 350 MG/ML SOLN COMPARISON:  CT abdomen and pelvis-12/17/2018 FINDINGS: VASCULAR Aorta: There is a moderate to large amount of eccentric irregular mixed calcified and noncalcified atherosclerotic plaque throughout the normal caliber abdominal aorta, not resulting in a hemodynamically significant stenosis. No evidence of abdominal aortic dissection or periaortic stranding on this nongated examination. Celiac: There is a minimal amount of eccentric mixed calcified and noncalcified atherosclerotic plaque involving the origin the celiac artery, not resulting in a hemodynamically significant stenosis. Conventional branching pattern. SMA: Widely patent without hemodynamically significant stenosis. Conventional branching pattern. The distal tributaries of the SMA appear widely patent without discrete intraluminal filling defect to suggest distal embolism with special attention paid to the vascular distribution supplying the descending colon. Renals: Solitary bilaterally. There is a minimal amount of eccentric mixed calcified and noncalcified atherosclerotic plaque involving the cranial aspect of the origin of the right renal artery, not definitely resulting in hemodynamically significant stenosis. No vessel irregularity to suggest FMD. IMA: Diseased at its origin though remains widely patent with early collateral supply from the SMA. Inflow: There is a moderate amount of eccentric mixed calcified and noncalcified affecting the bilateral common iliac arteries, not definitely resulting in hemodynamically significant stenosis. The bilateral internal iliac arteries are disease though patent of normal caliber. The bilateral external iliac arteries are widely patent without hemodynamically significant stenosis. Proximal Outflow: There is a moderate  amount of eccentric mixed calcified and noncalcified atherosclerotic plaque involving the bilateral common femoral arteries, not definitely resulting in hemodynamically significant stenosis. The imaged aspects of the bilateral deep and superficial femoral arteries are widely patent without hemodynamically significant stenosis. Veins: The IVC and pelvic venous system appear widely patent. Review of the MIP images confirms the above findings. _________________________________________________________ NON-VASCULAR Lower chest: Limited visualization of the lower thorax demonstrates minimal bibasilar ground-glass opacities, favored to represent atelectasis. No discrete focal airspace opacities. No pleural effusion. Borderline cardiomegaly. Trace amount of pericardial fluid, presumably physiologic. Hepatobiliary: Normal hepatic contour. There is a wedge shaped perfusional abnormality involving the subcapsular aspect of the right lobe of the liver on the arterial phase images (image 47, series 4), without definitive correlate on the acquired portal venous phase images (image 52, series 8), and without correlate on recent CT scan of the abdomen pelvis performed 12/17/2018 favored to be artifactual due to phase of enhancement. No discrete hepatic lesions. The hepatic portal veins appear widely patent. Post cholecystectomy. The common bile duct is mildly dilated with mild centralized intrahepatic biliary duct dilatation, unchanged, and favored to be sequela of post cholecystectomy state and biliary reservoir phenomena. No ascites. Pancreas: Normal appearance of the pancreas. Spleen: Normal appearance of the spleen. Note is made of a small splenule. Adrenals/Urinary Tract: There is symmetric enhancement of the bilateral kidneys. Unchanged bilateral punctate hypoattenuating renal lesions are too small to accurately characterize though favored to represent renal cysts. No definite renal stones this postcontrast examination. No  evidence of urinary obstruction, however there is hyperemia involving the bilateral renal pelves and bilateral ureters. No definitive cortical mottled enhancement to suggest pyelonephritis. While there is no evidence of urinary obstruction, the amount symmetric bilateral perinephric stranding has progressed compared to the 12/17/2018 examination. There is mild thickening of the bilateral adrenal glands without discrete nodule. Note is again made of a tiny (approximately 1.9 x 1.8 cm) diverticulum arising from the left side of the mid body of the urinary  bladder (image 189, series 8). Stomach/Bowel: Redemonstrated extensive diverticulosis, primarily involving the sigmoid colon within the left lower abdomen/pelvis. There is nonspecific ill-defined stranding surrounding about the descending colon within the left pericolic gutter (representative images 84, 99, 125 and 137, series 8), progressed compared to the 12/17/2018 examination. No evidence of perforation or definable/drainable fluid collection. Moderate-sized hiatal hernia. Large colonic stool burden without definitive evidence of enteric obstruction. Normal appearance of the terminal ileum. The appendix is not visualized compatible provided operative history. No pneumoperitoneum, pneumatosis or portal venous gas. Lymphatic: No bulky retroperitoneal mesenteric, pelvic or inguinal lymphadenopathy. Reproductive: Post hysterectomy. No discrete adnexal lesion. No free fluid the pelvic cul-de-sac. Other: Minimal amount of subcutaneous edema about the midline of the low back. Musculoskeletal: No acute or aggressive osseous abnormalities. Moderate to severe multilevel lumbar spine DDD, worse at L3-L4 with disc space height loss, endplate irregularity and sclerosis. Post right total hip replacement, incompletely evaluated but without evidence of hardware failure or loosening. IMPRESSION: VASCULAR 1. Moderate to large amount of atherosclerotic plaque within normal caliber  abdominal aorta, not resulting in a hemodynamically significant stenosis. Aortic Atherosclerosis (ICD10-I70.0). 2. No CT evidence of mesenteric ischemia. NON-VASCULAR 1. Extensive diverticulosis involving the sigmoid colon within the left lower abdomen/pelvis. 2. Nonspecific rather diffuse stranding about the descending colon without evidence of enteric obstruction, perforation or definable/drainable fluid collection - findings are nonspecific though could be seen in the setting of a colitis, though conceivably uncomplicated diverticulitis could have a similar appearance. Clinical correlation is advised. 3. Hyperemia about the bilateral renal pelves and ureters with associated worsening bilateral perinephric stranding but without evidence of urinary obstruction - findings are nonspecific though could be seen in the setting of a urinary tract infection. Currently, there is no CT evidence of pyelonephritis. Correlation with urinalysis is advised. 4. Moderate-sized hiatal hernia. Critical Value/emergent results were called by telephone at the time of interpretation on 01/11/2019 at 5:20 pm to Dr. Roxan Hockeyobinson who verbally acknowledged these results. Electronically Signed   By: Simonne ComeJohn  Watts M.D.   On: 01/11/2019 17:32    EKG:   Orders placed or performed during the hospital encounter of 01/11/19  . ED EKG  . ED EKG  . ED EKG 12-Lead  . ED EKG 12-Lead  . EKG 12-Lead  . EKG 12-Lead  . EKG 12-Lead  . EKG 12-Lead  . EKG 12-Lead  . EKG 12-Lead    ASSESSMENT AND PLAN:   85 year old female with past medical history significant for hypertension, dementia, Parkinson's disease, essential tremor, hypothyroidism, arthritis presents to hospital from home secondary to abdominal pain.  1.  Acute diverticulitis-recent admission for diverticulitis as well.  Patient has known history of diverticulosis with recurrent diverticulitis -CT of the abdomen reviewed.  No abscess noted.  Appreciate general surgery consult  -Continue IV antibiotics with Zosyn for now -Slowly advance diet.  Will start on clears today.  Symptoms have very much resolved at this time. -WBC slightly elevated.  2.  Elevated troponin-most likely demand ischemia given her acute diverticulitis. -Appreciate cardiology consult.  Patient is currently on heparin drip.  Awaiting echocardiogram results.   -She denies any chest pain.  If echo with no wall motion abnormalities, will DC heparin drip.  3.  Parkinsons disease- continue Sinemet.  Also on primidone for essential tremors  4.  Hypothyroidism-continue Synthroid  5.  DVT prophylaxis-currently on heparin drip  Ambulates with a walker at baseline   All the records are reviewed and case discussed with Care Management/Social Workerr.  Management plans discussed with the patient, family and they are in agreement.  CODE STATUS: DNR  TOTAL TIME TAKING CARE OF THIS PATIENT: 38 minutes.   POSSIBLE D/C IN 2 DAYS, DEPENDING ON CLINICAL CONDITION.   Enid Baasadhika Ector Laurel M.D on 01/12/2019 at 1:35 PM  Between 7am to 6pm - Pager - [phone]  After 6pm go to [email or web address] - password [email or web address]  [facility]  Office  [phone]  CC: Primary care physician; Patient, No Pcp Per

## 2019-01-12 NOTE — Progress Notes (Signed)
*  PRELIMINARY RESULTS* Echocardiogram 2D Echocardiogram has been performed.  Brandy Oconnor 01/12/2019, 9:17 AM

## 2019-01-13 LAB — CBC
HCT: 37.5 % (ref 36.0–46.0)
Hemoglobin: 12.3 g/dL (ref 12.0–15.0)
MCH: 31.4 pg (ref 26.0–34.0)
MCHC: 32.8 g/dL (ref 30.0–36.0)
MCV: 95.7 fL (ref 80.0–100.0)
Platelets: 141 10*3/uL — ABNORMAL LOW (ref 150–400)
RBC: 3.92 MIL/uL (ref 3.87–5.11)
RDW: 14.2 % (ref 11.5–15.5)
WBC: 8.3 10*3/uL (ref 4.0–10.5)
nRBC: 0 % (ref 0.0–0.2)

## 2019-01-13 LAB — BASIC METABOLIC PANEL
Anion gap: 7 (ref 5–15)
BUN: 10 mg/dL (ref 8–23)
CO2: 24 mmol/L (ref 22–32)
Calcium: 7.5 mg/dL — ABNORMAL LOW (ref 8.9–10.3)
Chloride: 113 mmol/L — ABNORMAL HIGH (ref 98–111)
Creatinine, Ser: 0.75 mg/dL (ref 0.44–1.00)
GFR calc Af Amer: >60 mL/min (ref >60)
GFR calc non Af Amer: >60 mL/min (ref >60)
Glucose, Bld: 89 mg/dL (ref 70–99)
Potassium: 3.4 mmol/L — ABNORMAL LOW (ref 3.5–5.1)
Sodium: 144 mmol/L (ref 135–145)

## 2019-01-13 MED ORDER — POTASSIUM CHLORIDE CRYS ER 20 MEQ PO TBCR
40.0000 meq | EXTENDED_RELEASE_TABLET | Freq: Once | ORAL | Status: AC
  Administered 2019-01-13: 40 meq via ORAL
  Filled 2019-01-13: qty 2

## 2019-01-13 MED ORDER — ENSURE ENLIVE PO LIQD
237.0000 mL | Freq: Two times a day (BID) | ORAL | Status: DC
  Administered 2019-01-13 – 2019-01-14 (×2): 237 mL via ORAL

## 2019-01-13 NOTE — TOC Progression Note (Signed)
Transition of Care [facility]) - Progression Note    Patient Details  Name: Alvis Pulcini MRN: [id number] Date of Birth: 06/13/1931  [facility]) CM/SW Contact  Katrina Stack, RN Phone Number: 01/13/2019, 5:00 PM  Clinical Narrative:   Spoke with patient's [facility] physician and informed that if home health is recommended, [facility] can provide therapy in the home and or therapy in center.    Expected Discharge Plan: Home/Self Care([facility]) Barriers to Discharge: Continued Medical Work up  Expected Discharge Plan and Services Expected Discharge Plan: Home/Self Care([facility])       Living arrangements for the past 2 months: [place]                                       Social Determinants of Health (SDOH) Interventions    Readmission Risk Interventions No flowsheet data found.

## 2019-01-13 NOTE — Evaluation (Signed)
Physical Therapy Evaluation Patient Details Name: Brandy DressSarah Oconnor MRN: [id number] DOB: [birth year] Today's Date: 01/13/2019   History of Present Illness  Pt presented to ED for abdominal pain, GI stated most likely attributable to recurrent diverticulitis vs descending colitis. Work up showedelevated high-sensitivity troponin, attributed to demand ischemia, pt initially placed on heparin drip. PMH of  Parkinson's Dementia, HLD, HTN, Hypothyroidism, and advanced age.    Clinical Impression  Patient alert, oriented x4, behavior WFLs throughout session, denied abdominal pain. Per chart pt has history of dementia but was oriented and able to provide PLOF, stated she lives with her daughter, ambulates with walker at baseline in home, needs assistance for ADLs.   Upon assessment pt demonstrated supine to sit with supervision and extended time. Sitting balance fair, and sit <> stand with RW and CGA, performed several times for instruction/education. Pt ambulated ~875ft to the chair with RW and CGA, and then 980ft. Pt exhibited decreased stride length, gait velocity, and mild difficulty with RW navigation (was not wearing her glasses). Of note, when turning to sit in chair in room, pt experienced knee buckling/sitting to early, modA from PT to correct. Pt and PT practiced safe stand to sit transfers for education/demonstrated purposes to increase safety. Overall the patient demonstrated deficits (see "PT Problem List") that impede the patient's functional abilities, safety, and mobility and would benefit from skilled PT intervention. Recommendation is HHPT with supervision/assistsance 24hrs.     Follow Up Recommendations Home health PT;Supervision/Assistance - 24 hour    Equipment Recommendations  None recommended by PT(Pt stated she has a walker at home)    Recommendations for Other Services       Precautions / Restrictions Precautions Precautions: Fall Restrictions Weight Bearing Restrictions: No       Mobility  Bed Mobility Overal bed mobility: Needs Assistance Bed Mobility: Supine to Sit     Supine to sit: Supervision        Transfers Overall transfer level: Needs assistance Equipment used: Rolling walker (2 wheeled) Transfers: Sit to/from Stand Sit to Stand: Min guard            Ambulation/Gait Ambulation/Gait assistance: Min guard Gait Distance (Feet): 80 Feet Assistive device: Rolling walker (2 wheeled)   Gait velocity: decreased Gait velocity interpretation: <1.8 ft/sec, indicate of risk for recurrent falls General Gait Details: Pt exhibited decreased stride length bilaterally, slow and cautious steps but overall steady while ambulating  Stairs            Wheelchair Mobility    Modified Rankin (Stroke Patients Only)       Balance Overall balance assessment: Needs assistance Sitting-balance support: Feet supported Sitting balance-Leahy Scale: Fair     Standing balance support: Bilateral upper extremity supported Standing balance-Leahy Scale: Poor                               Pertinent Vitals/Pain Pain Assessment: No/denies pain    Home Living Family/patient expects to be discharged to:: Private residence Living Arrangements: Children(daughter, "[name]") Available Help at Discharge: Family Type of Home: House Home Access: Stairs to enter   Secretary/administratorntrance Stairs-Number of Steps: 1 Home Layout: One level Home Equipment: Bedside commode;Walker - 2 wheels;Shower seat Additional Comments: Per chart pt poor historian, pt orientedx4, able to provide PLOF information    Prior Function Level of Independence: Needs assistance   Gait / Transfers Assistance Needed: ambulates with RW at home  ADL's / Homemaking Assistance Needed:  daughter assists with ADLs        Hand Dominance   Dominant Hand: Left    Extremity/Trunk Assessment   Upper Extremity Assessment Upper Extremity Assessment: Generalized weakness    Lower Extremity  Assessment Lower Extremity Assessment: Generalized weakness    Cervical / Trunk Assessment Cervical / Trunk Assessment: Normal  Communication   Communication: No difficulties  Cognition Arousal/Alertness: Awake/alert Behavior During Therapy: WFL for tasks assessed/performed Overall Cognitive Status: Within Functional Limits for tasks assessed                                        General Comments      Exercises Other Exercises Other Exercises: Pt with some buckling noted when attempting to sit in chair, sat to early, modA from PT to correct. Pt instructed in, and demonstrated proper technique to increase safety.   Assessment/Plan    PT Assessment Patient needs continued PT services  PT Problem List Decreased strength;Decreased mobility;Decreased range of motion;Decreased activity tolerance;Decreased balance       PT Treatment Interventions DME instruction;Therapeutic exercise;Gait training;Balance training;Stair training;Neuromuscular re-education;Functional mobility training;Therapeutic activities;Patient/family education    PT Goals (Current goals can be found in the Care Plan section)  Acute Rehab PT Goals Patient Stated Goal: to go home PT Goal Formulation: With patient Time For Goal Achievement: 01/27/19 Potential to Achieve Goals: Fair    Frequency Min 2X/week   Barriers to discharge        Co-evaluation               AM-PAC PT "6 Clicks" Mobility  Outcome Measure Help needed turning from your back to your side while in a flat bed without using bedrails?: A Little Help needed moving from lying on your back to sitting on the side of a flat bed without using bedrails?: A Little Help needed moving to and from a bed to a chair (including a wheelchair)?: A Little Help needed standing up from a chair using your arms (e.g., wheelchair or bedside chair)?: A Little Help needed to walk in hospital room?: A Little Help needed climbing 3-5 steps  with a railing? : A Lot 6 Click Score: 17    End of Session Equipment Utilized During Treatment: Gait belt Activity Tolerance: Patient tolerated treatment well Patient left: in chair;with chair alarm set;with call bell/phone within reach Nurse Communication: Mobility status PT Visit Diagnosis: Other abnormalities of gait and mobility (R26.89);Muscle weakness (generalized) (M62.81);Difficulty in walking, not elsewhere classified (R26.2)    Time: 9767-3419 PT Time Calculation (min) (ACUTE ONLY): 32 min   Charges:   PT Evaluation $PT Eval Low Complexity: 1 Low PT Treatments $Therapeutic Exercise: 8-22 mins        Lieutenant Diego PT, DPT 11:21 AM,01/13/19 [phone]

## 2019-01-13 NOTE — Consult Note (Signed)
Pharmacy Antibiotic Note  Brandy Oconnor is a 85 y.o. female admitted on 01/11/2019 with diverticulitis.  Pharmacy has been consulted for Zosyn dosing. She has a  history of diverticulosis with recurrent diverticulitis and no abscess noted. Leukocytosis has resolved and she has been afebrile since the previous note. This is day #3 of Zosyn therapy and surgery is recommending medical management  Plan: Continue Zosyn 3.375g IV q8h (4 hour infusion).  Height: 5' (152.4 cm) Weight: 130 lb 14.4 oz (59.4 kg) IBW/kg (Calculated) : 45.5  Temp (24hrs), Avg:98.5 F (36.9 C), Min:98.3 F (36.8 C), Max:98.7 F (37.1 C)  Recent Labs  Lab 01/11/19 1321 01/11/19 1344 01/12/19 0211 01/13/19 0449  WBC 10.7*  --  11.6* 8.3  CREATININE 1.12*  --  0.88 0.75  LATICACIDVEN  --  2.8*  --   --     Estimated Creatinine Clearance: 40.7 mL/min (by C-G formula based on SCr of 0.75 mg/dL).    Antimicrobials this admission: Zosyn 7/1 >>  Cefepime 7/1 x 1 dose Flagyl 7/1 x 1 dose  Microbiology results: 7/1 BCx: NGTD   Thank you for allowing pharmacy to be a part of this patient's care.  Dallie Piles, PharmD 01/13/2019 12:22 PM

## 2019-01-13 NOTE — Care Management Important Message (Signed)
Important Message  Patient Details  Name: Brandy Oconnor MRN: [id number] Date of Birth: 1931-03-05   Medicare Important Message Given:  Yes     [name] 01/13/2019, 12:13 PM

## 2019-01-13 NOTE — Progress Notes (Signed)
CC: DIverticulitis Subjective: Feeling well, no abd pain, taking PO, no N/V. AMbulating in halls w PT and walker  Objective: Vital signs in last 24 hours: Temp:  [98.3 F (36.8 C)-98.6 F (37 C)] 98.6 F (37 C) (07/03 0359) Pulse Rate:  [78-82] 82 (07/03 0359) Resp:  [16-17] 16 (07/03 0359) BP: (132-139)/(60-63) 135/61 (07/03 0359) SpO2:  [96 %-97 %] 96 % (07/03 0359) Last BM Date: 01/12/19  Intake/Output from previous day: 07/02 0701 - 07/03 0700 In: 2056.2 [I.V.:1925; IV Piggyback:131.2] Out: 750 [Urine:750] Intake/Output this shift: No intake/output data recorded.  Physical exam:  Debilitated fragile elderly female in NAD Abd: soft, nt no peritonititis Ext: warm and well perfused  Lab Results: CBC  Recent Labs    01/12/19 0211 01/13/19 0449  WBC 11.6* 8.3  HGB 14.1 12.3  HCT 42.6 37.5  PLT 176 141*   BMET Recent Labs    01/12/19 0211 01/13/19 0449  NA 144 144  K 4.0 3.4*  CL 113* 113*  CO2 25 24  GLUCOSE 121* 89  BUN 18 10  CREATININE 0.88 0.75  CALCIUM 7.8* 7.5*   PT/INR Recent Labs    01/11/19 1321  LABPROT 12.8  INR 1.0   ABG No results for input(s): PHART, HCO3 in the last 72 hours.  Invalid input(s): PCO2, PO2  Studies/Results: Dg Chest Port 1 View  Result Date: 01/11/2019 CLINICAL DATA:  Sepsis. EXAM: PORTABLE CHEST 1 VIEW COMPARISON:  December 17, 2018 FINDINGS: The heart size is stable. There are persistent airspace opacities at the lung bases bilaterally, left worse than right. These are favored to represent chronic atelectasis or scarring. There is no pneumothorax. No large pleural effusion. No acute osseous abnormality. IMPRESSION: 1. No acute cardiopulmonary process. 2. Persistent bibasilar airspace opacities favored to represent atelectasis. A developing infiltrate is difficult to exclude. Electronically Signed   By: Katherine Mantlehristopher  Green M.D.   On: 01/11/2019 14:13   Ct Angio Abd/pel W And/or Wo Contrast  Result Date:  01/11/2019 CLINICAL DATA:  Lower abdominal pain. Evaluate for mesenteric ischemia. EXAM: CTA ABDOMEN AND PELVIS WITH CONTRAST TECHNIQUE: Multidetector CT imaging of the abdomen and pelvis was performed using the standard protocol during bolus administration of intravenous contrast. Multiplanar reconstructed images and MIPs were obtained and reviewed to evaluate the vascular anatomy. CONTRAST:  75mL OMNIPAQUE IOHEXOL 350 MG/ML SOLN COMPARISON:  CT abdomen and pelvis-12/17/2018 FINDINGS: VASCULAR Aorta: There is a moderate to large amount of eccentric irregular mixed calcified and noncalcified atherosclerotic plaque throughout the normal caliber abdominal aorta, not resulting in a hemodynamically significant stenosis. No evidence of abdominal aortic dissection or periaortic stranding on this nongated examination. Celiac: There is a minimal amount of eccentric mixed calcified and noncalcified atherosclerotic plaque involving the origin the celiac artery, not resulting in a hemodynamically significant stenosis. Conventional branching pattern. SMA: Widely patent without hemodynamically significant stenosis. Conventional branching pattern. The distal tributaries of the SMA appear widely patent without discrete intraluminal filling defect to suggest distal embolism with special attention paid to the vascular distribution supplying the descending colon. Renals: Solitary bilaterally. There is a minimal amount of eccentric mixed calcified and noncalcified atherosclerotic plaque involving the cranial aspect of the origin of the right renal artery, not definitely resulting in hemodynamically significant stenosis. No vessel irregularity to suggest FMD. IMA: Diseased at its origin though remains widely patent with early collateral supply from the SMA. Inflow: There is a moderate amount of eccentric mixed calcified and noncalcified affecting the bilateral common iliac arteries, not  definitely resulting in hemodynamically  significant stenosis. The bilateral internal iliac arteries are disease though patent of normal caliber. The bilateral external iliac arteries are widely patent without hemodynamically significant stenosis. Proximal Outflow: There is a moderate amount of eccentric mixed calcified and noncalcified atherosclerotic plaque involving the bilateral common femoral arteries, not definitely resulting in hemodynamically significant stenosis. The imaged aspects of the bilateral deep and superficial femoral arteries are widely patent without hemodynamically significant stenosis. Veins: The IVC and pelvic venous system appear widely patent. Review of the MIP images confirms the above findings. _________________________________________________________ NON-VASCULAR Lower chest: Limited visualization of the lower thorax demonstrates minimal bibasilar ground-glass opacities, favored to represent atelectasis. No discrete focal airspace opacities. No pleural effusion. Borderline cardiomegaly. Trace amount of pericardial fluid, presumably physiologic. Hepatobiliary: Normal hepatic contour. There is a wedge shaped perfusional abnormality involving the subcapsular aspect of the right lobe of the liver on the arterial phase images (image 47, series 4), without definitive correlate on the acquired portal venous phase images (image 52, series 8), and without correlate on recent CT scan of the abdomen pelvis performed 12/17/2018 favored to be artifactual due to phase of enhancement. No discrete hepatic lesions. The hepatic portal veins appear widely patent. Post cholecystectomy. The common bile duct is mildly dilated with mild centralized intrahepatic biliary duct dilatation, unchanged, and favored to be sequela of post cholecystectomy state and biliary reservoir phenomena. No ascites. Pancreas: Normal appearance of the pancreas. Spleen: Normal appearance of the spleen. Note is made of a small splenule. Adrenals/Urinary Tract: There is  symmetric enhancement of the bilateral kidneys. Unchanged bilateral punctate hypoattenuating renal lesions are too small to accurately characterize though favored to represent renal cysts. No definite renal stones this postcontrast examination. No evidence of urinary obstruction, however there is hyperemia involving the bilateral renal pelves and bilateral ureters. No definitive cortical mottled enhancement to suggest pyelonephritis. While there is no evidence of urinary obstruction, the amount symmetric bilateral perinephric stranding has progressed compared to the 12/17/2018 examination. There is mild thickening of the bilateral adrenal glands without discrete nodule. Note is again made of a tiny (approximately 1.9 x 1.8 cm) diverticulum arising from the left side of the mid body of the urinary bladder (image 189, series 8). Stomach/Bowel: Redemonstrated extensive diverticulosis, primarily involving the sigmoid colon within the left lower abdomen/pelvis. There is nonspecific ill-defined stranding surrounding about the descending colon within the left pericolic gutter (representative images 84, 99, 125 and 137, series 8), progressed compared to the 12/17/2018 examination. No evidence of perforation or definable/drainable fluid collection. Moderate-sized hiatal hernia. Large colonic stool burden without definitive evidence of enteric obstruction. Normal appearance of the terminal ileum. The appendix is not visualized compatible provided operative history. No pneumoperitoneum, pneumatosis or portal venous gas. Lymphatic: No bulky retroperitoneal mesenteric, pelvic or inguinal lymphadenopathy. Reproductive: Post hysterectomy. No discrete adnexal lesion. No free fluid the pelvic cul-de-sac. Other: Minimal amount of subcutaneous edema about the midline of the low back. Musculoskeletal: No acute or aggressive osseous abnormalities. Moderate to severe multilevel lumbar spine DDD, worse at L3-L4 with disc space height  loss, endplate irregularity and sclerosis. Post right total hip replacement, incompletely evaluated but without evidence of hardware failure or loosening. IMPRESSION: VASCULAR 1. Moderate to large amount of atherosclerotic plaque within normal caliber abdominal aorta, not resulting in a hemodynamically significant stenosis. Aortic Atherosclerosis (ICD10-I70.0). 2. No CT evidence of mesenteric ischemia. NON-VASCULAR 1. Extensive diverticulosis involving the sigmoid colon within the left lower abdomen/pelvis. 2. Nonspecific rather diffuse stranding about  the descending colon without evidence of enteric obstruction, perforation or definable/drainable fluid collection - findings are nonspecific though could be seen in the setting of a colitis, though conceivably uncomplicated diverticulitis could have a similar appearance. Clinical correlation is advised. 3. Hyperemia about the bilateral renal pelves and ureters with associated worsening bilateral perinephric stranding but without evidence of urinary obstruction - findings are nonspecific though could be seen in the setting of a urinary tract infection. Currently, there is no CT evidence of pyelonephritis. Correlation with urinalysis is advised. 4. Moderate-sized hiatal hernia. Critical Value/emergent results were called by telephone at the time of interpretation on 01/11/2019 at 5:20 pm to Dr. Quentin Cornwall who verbally acknowledged these results. Electronically Signed   By: Sandi Mariscal M.D.   On: 01/11/2019 17:32    Anti-infectives: Anti-infectives (From admission, onward)   Start     Dose/Rate Route Frequency Ordered Stop   01/11/19 2200  piperacillin-tazobactam (ZOSYN) IVPB 3.375 g     3.375 g 12.5 mL/hr over 240 Minutes Intravenous Every 8 hours 01/11/19 1906     01/11/19 1345  ceFEPIme (MAXIPIME) 2 g in sodium chloride 0.9 % 100 mL IVPB     2 g 200 mL/hr over 30 Minutes Intravenous  Once 01/11/19 1334 01/11/19 1506   01/11/19 1345  metroNIDAZOLE (FLAGYL) IVPB  500 mg     500 mg 100 mL/hr over 60 Minutes Intravenous  Once 01/11/19 1334 01/11/19 1507      Assessment/Plan: Diverticulitis responding to medical rx Continue a/bs No need for surgical intervention at this time  We will follow up outpt in 2 weeks We will be available    Caroleen Hamman, MD, FACS  01/13/2019

## 2019-01-13 NOTE — Progress Notes (Addendum)
[facility]   PATIENT NAME: Brandy DressSarah Oconnor    MR#:  [id number]  DATE OF BIRTH:  [birth year]  SUBJECTIVE:  CHIEF COMPLAINT:   Chief Complaint  Patient presents with  . Abdominal Pain   -Patient is pleasantly confused.  Denies any symptoms at this time - tolerating diet well  REVIEW OF SYSTEMS:  Review of Systems  Constitutional: Negative for chills, fever and malaise/fatigue.  Eyes: Negative for blurred vision and double vision.  Respiratory: Negative for cough, shortness of breath and wheezing.   Cardiovascular: Negative for chest pain and palpitations.  Gastrointestinal: Negative for abdominal pain, constipation, diarrhea, nausea and vomiting.  Genitourinary: Negative for dysuria.  Neurological: Negative for dizziness, seizures and headaches.    DRUG ALLERGIES:   Allergies  Allergen Reactions  . Codeine Rash    VITALS:  Blood pressure (!) 143/64, pulse 86, temperature 98.7 F (37.1 C), temperature source Oral, resp. rate 19, height 5' (1.524 m), weight 59.4 kg, SpO2 97 %.  PHYSICAL EXAMINATION:  Physical Exam   GENERAL:  85 y.o.-year-old elderly patient lying in the bed with no acute distress.  EYES: Pupils equal, round, reactive to light and accommodation. No scleral icterus. Extraocular muscles intact.  HEENT: Head atraumatic, normocephalic. Oropharynx and nasopharynx clear.  NECK:  Supple, no jugular venous distention. No thyroid enlargement, no tenderness.  LUNGS: Normal breath sounds bilaterally, no wheezing, rales,rhonchi or crepitation. No use of accessory muscles of respiration. Decreased bibasilar breath sounds CARDIOVASCULAR: S1, S2 normal. No  rubs, or gallops. 2/6 systolic murmur present ABDOMEN: Soft, nontender, nondistended. Bowel sounds present. No organomegaly or mass.  EXTREMITIES: No pedal edema, cyanosis, or clubbing.  NEUROLOGIC: Cranial nerves II through XII are intact. Muscle strength equal in all  extremities. Sensation intact. Gait not checked. Global weakness noted. PSYCHIATRIC: The patient is alert and oriented to self SKIN: No obvious rash, lesion, or ulcer. Several skin tags noted around her neck   LABORATORY PANEL:   CBC Recent Labs  Lab 01/13/19 0449  WBC 8.3  HGB 12.3  HCT 37.5  PLT 141*   ------------------------------------------------------------------------------------------------------------------  Chemistries  Recent Labs  Lab 01/11/19 1321  01/13/19 0449  NA 140   < > 144  K 4.3   < > 3.4*  CL 105   < > 113*  CO2 24   < > 24  GLUCOSE 164*   < > 89  BUN 17   < > 10  CREATININE 1.12*   < > 0.75  CALCIUM 8.9   < > 7.5*  AST 26  --   --   ALT 10  --   --   ALKPHOS 130*  --   --   BILITOT 0.5  --   --    < > = values in this interval not displayed.   ------------------------------------------------------------------------------------------------------------------  Cardiac Enzymes No results for input(s): TROPONINI in the last 168 hours. ------------------------------------------------------------------------------------------------------------------  RADIOLOGY:  Ct Angio Abd/pel W And/or Wo Contrast  Result Date: 01/11/2019 CLINICAL DATA:  Lower abdominal pain. Evaluate for mesenteric ischemia. EXAM: CTA ABDOMEN AND PELVIS WITH CONTRAST TECHNIQUE: Multidetector CT imaging of the abdomen and pelvis was performed using the standard protocol during bolus administration of intravenous contrast. Multiplanar reconstructed images and MIPs were obtained and reviewed to evaluate the vascular anatomy. CONTRAST:  75mL OMNIPAQUE IOHEXOL 350 MG/ML SOLN COMPARISON:  CT abdomen and pelvis-12/17/2018 FINDINGS: VASCULAR Aorta: There is a moderate to large amount of eccentric irregular mixed calcified  and noncalcified atherosclerotic plaque throughout the normal caliber abdominal aorta, not resulting in a hemodynamically significant stenosis. No evidence of abdominal  aortic dissection or periaortic stranding on this nongated examination. Celiac: There is a minimal amount of eccentric mixed calcified and noncalcified atherosclerotic plaque involving the origin the celiac artery, not resulting in a hemodynamically significant stenosis. Conventional branching pattern. SMA: Widely patent without hemodynamically significant stenosis. Conventional branching pattern. The distal tributaries of the SMA appear widely patent without discrete intraluminal filling defect to suggest distal embolism with special attention paid to the vascular distribution supplying the descending colon. Renals: Solitary bilaterally. There is a minimal amount of eccentric mixed calcified and noncalcified atherosclerotic plaque involving the cranial aspect of the origin of the right renal artery, not definitely resulting in hemodynamically significant stenosis. No vessel irregularity to suggest FMD. IMA: Diseased at its origin though remains widely patent with early collateral supply from the SMA. Inflow: There is a moderate amount of eccentric mixed calcified and noncalcified affecting the bilateral common iliac arteries, not definitely resulting in hemodynamically significant stenosis. The bilateral internal iliac arteries are disease though patent of normal caliber. The bilateral external iliac arteries are widely patent without hemodynamically significant stenosis. Proximal Outflow: There is a moderate amount of eccentric mixed calcified and noncalcified atherosclerotic plaque involving the bilateral common femoral arteries, not definitely resulting in hemodynamically significant stenosis. The imaged aspects of the bilateral deep and superficial femoral arteries are widely patent without hemodynamically significant stenosis. Veins: The IVC and pelvic venous system appear widely patent. Review of the MIP images confirms the above findings. _________________________________________________________ NON-VASCULAR  Lower chest: Limited visualization of the lower thorax demonstrates minimal bibasilar ground-glass opacities, favored to represent atelectasis. No discrete focal airspace opacities. No pleural effusion. Borderline cardiomegaly. Trace amount of pericardial fluid, presumably physiologic. Hepatobiliary: Normal hepatic contour. There is a wedge shaped perfusional abnormality involving the subcapsular aspect of the right lobe of the liver on the arterial phase images (image 47, series 4), without definitive correlate on the acquired portal venous phase images (image 52, series 8), and without correlate on recent CT scan of the abdomen pelvis performed 12/17/2018 favored to be artifactual due to phase of enhancement. No discrete hepatic lesions. The hepatic portal veins appear widely patent. Post cholecystectomy. The common bile duct is mildly dilated with mild centralized intrahepatic biliary duct dilatation, unchanged, and favored to be sequela of post cholecystectomy state and biliary reservoir phenomena. No ascites. Pancreas: Normal appearance of the pancreas. Spleen: Normal appearance of the spleen. Note is made of a small splenule. Adrenals/Urinary Tract: There is symmetric enhancement of the bilateral kidneys. Unchanged bilateral punctate hypoattenuating renal lesions are too small to accurately characterize though favored to represent renal cysts. No definite renal stones this postcontrast examination. No evidence of urinary obstruction, however there is hyperemia involving the bilateral renal pelves and bilateral ureters. No definitive cortical mottled enhancement to suggest pyelonephritis. While there is no evidence of urinary obstruction, the amount symmetric bilateral perinephric stranding has progressed compared to the 12/17/2018 examination. There is mild thickening of the bilateral adrenal glands without discrete nodule. Note is again made of a tiny (approximately 1.9 x 1.8 cm) diverticulum arising from the  left side of the mid body of the urinary bladder (image 189, series 8). Stomach/Bowel: Redemonstrated extensive diverticulosis, primarily involving the sigmoid colon within the left lower abdomen/pelvis. There is nonspecific ill-defined stranding surrounding about the descending colon within the left pericolic gutter (representative images 84, 99, 125 and 137, series 8),  progressed compared to the 12/17/2018 examination. No evidence of perforation or definable/drainable fluid collection. Moderate-sized hiatal hernia. Large colonic stool burden without definitive evidence of enteric obstruction. Normal appearance of the terminal ileum. The appendix is not visualized compatible provided operative history. No pneumoperitoneum, pneumatosis or portal venous gas. Lymphatic: No bulky retroperitoneal mesenteric, pelvic or inguinal lymphadenopathy. Reproductive: Post hysterectomy. No discrete adnexal lesion. No free fluid the pelvic cul-de-sac. Other: Minimal amount of subcutaneous edema about the midline of the low back. Musculoskeletal: No acute or aggressive osseous abnormalities. Moderate to severe multilevel lumbar spine DDD, worse at L3-L4 with disc space height loss, endplate irregularity and sclerosis. Post right total hip replacement, incompletely evaluated but without evidence of hardware failure or loosening. IMPRESSION: VASCULAR 1. Moderate to large amount of atherosclerotic plaque within normal caliber abdominal aorta, not resulting in a hemodynamically significant stenosis. Aortic Atherosclerosis (ICD10-I70.0). 2. No CT evidence of mesenteric ischemia. NON-VASCULAR 1. Extensive diverticulosis involving the sigmoid colon within the left lower abdomen/pelvis. 2. Nonspecific rather diffuse stranding about the descending colon without evidence of enteric obstruction, perforation or definable/drainable fluid collection - findings are nonspecific though could be seen in the setting of a colitis, though conceivably  uncomplicated diverticulitis could have a similar appearance. Clinical correlation is advised. 3. Hyperemia about the bilateral renal pelves and ureters with associated worsening bilateral perinephric stranding but without evidence of urinary obstruction - findings are nonspecific though could be seen in the setting of a urinary tract infection. Currently, there is no CT evidence of pyelonephritis. Correlation with urinalysis is advised. 4. Moderate-sized hiatal hernia. Critical Value/emergent results were called by telephone at the time of interpretation on 01/11/2019 at 5:20 pm to Dr. Roxan Hockeyobinson who verbally acknowledged these results. Electronically Signed   By: Simonne ComeJohn  Watts M.D.   On: 01/11/2019 17:32    EKG:   Orders placed or performed during the hospital encounter of 01/11/19  . ED EKG  . ED EKG  . ED EKG 12-Lead  . ED EKG 12-Lead  . EKG 12-Lead  . EKG 12-Lead  . EKG 12-Lead  . EKG 12-Lead  . EKG 12-Lead  . EKG 12-Lead    ASSESSMENT AND PLAN:   85 year old female with past medical history significant for hypertension, dementia, Parkinson's disease, essential tremor, hypothyroidism, arthritis presents to hospital from home secondary to abdominal pain.  1.  Acute diverticulitis-recent admission for diverticulitis as well.  Patient has known history of diverticulosis with recurrent diverticulitis -CT of the abdomen reviewed.  No abscess noted.  Appreciate general surgery consult -Continue IV antibiotics with Zosyn for now-change to oral Augmentin in a.m. -Slowly advance diet. Symptoms have very much resolved at this time. -WBC elevated- improved now  2.  Elevated troponin-most likely demand ischemia given her acute diverticulitis. -Appreciate cardiology consult.  Off heparin drip.   - echocardiogram with normal EF and no wall motion abnormalities.   -She denies any chest pain - no further cardiac work up at this time  3.  Parkinsons disease- continue Sinemet.  Also on primidone for  essential tremors  4.  Hypothyroidism-continue Synthroid  5.  DVT prophylaxis- lovenox  Ambulates with a walker at baseline Possible discharge home tomorrow. Updated daughter   All the records are reviewed and case discussed with Care Management/Social Workerr. Management plans discussed with the patient, family and they are in agreement.  CODE STATUS: DNR  TOTAL TIME TAKING CARE OF THIS PATIENT: 36 minutes.   POSSIBLE D/C IN 2 DAYS, DEPENDING ON CLINICAL CONDITION.  Enid Baasadhika Vallie Teters M.D on 01/13/2019 at 2:31 PM  Between 7am to 6pm - Pager - [phone]  After 6pm go to [email or web address] - password [email or web address]  [facility]  Office  [phone]  CC: Primary care physician; Patient, No Pcp Per

## 2019-01-14 LAB — BASIC METABOLIC PANEL
Anion gap: 9 (ref 5–15)
BUN: 8 mg/dL (ref 8–23)
CO2: 24 mmol/L (ref 22–32)
Calcium: 8 mg/dL — ABNORMAL LOW (ref 8.9–10.3)
Chloride: 111 mmol/L (ref 98–111)
Creatinine, Ser: 0.65 mg/dL (ref 0.44–1.00)
GFR calc Af Amer: >60 mL/min (ref >60)
GFR calc non Af Amer: >60 mL/min (ref >60)
Glucose, Bld: 107 mg/dL — ABNORMAL HIGH (ref 70–99)
Potassium: 3.3 mmol/L — ABNORMAL LOW (ref 3.5–5.1)
Sodium: 144 mmol/L (ref 135–145)

## 2019-01-14 MED ORDER — AMOXICILLIN-POT CLAVULANATE 875-125 MG PO TABS: 1.0000 | ORAL_TABLET | Freq: Two times a day (BID) | ORAL | Status: DC

## 2019-01-14 MED ORDER — AMOXICILLIN-POT CLAVULANATE 875-125 MG PO TABS: 1.0000 | ORAL_TABLET | Freq: Two times a day (BID) | ORAL | 0 refills | Status: AC

## 2019-01-14 MED ORDER — AMOXICILLIN-POT CLAVULANATE 875-125 MG PO TABS: 1.0000 | ORAL_TABLET | Freq: Two times a day (BID) | ORAL | 0 refills | Status: DC

## 2019-01-14 NOTE — Progress Notes (Signed)
01/14/2019  3:05 PM  Brandy Oconnor to be D/C'd Home per MD order.  Discussed prescriptions and follow up appointments with the patient. Prescriptions given to patient, medication list explained in detail. Pt verbalized understanding.  Allergies as of 01/14/2019      Reactions   Codeine Rash      Medication List    TAKE these medications   acetaminophen 500 MG tablet Commonly known as: TYLENOL Take 500-1,000 mg by mouth every 6 (six) hours as needed. Notes to patient: As needed   amoxicillin-clavulanate 875-125 MG tablet Commonly known as: AUGMENTIN Take 1 tablet by mouth every 12 (twelve) hours for 10 days.   atenolol 50 MG tablet Commonly known as: TENORMIN Take 50 mg by mouth daily. Notes to patient: Morning 01/15/19   carbidopa-levodopa 10-100 MG tablet Commonly known as: SINEMET IR Take 1 tablet by mouth 2 (two) times daily. Notes to patient: Before bed 01/14/19   cholecalciferol 25 MCG (1000 UT) tablet Commonly known as: VITAMIN D3 Take 1,000 Units by mouth daily. Notes to patient: Morning 01/15/19   ENSURE PLUS PO Take 237 mLs by mouth 2 (two) times a day. Notes to patient: Afternoon 01/14/19   ketotifen 0.025 % ophthalmic solution Commonly known as: ZADITOR Place 1 drop into both eyes daily. Notes to patient: Morning 01/15/19   levothyroxine 100 MCG tablet Commonly known as: SYNTHROID Take 100 mcg by mouth daily. Notes to patient: Morning 01/15/19   lidocaine 5 % Commonly known as: LIDODERM Place 1 patch onto the skin daily. Remove & Discard patch within 12 hours or as directed by MD   lisinopril 5 MG tablet Commonly known as: ZESTRIL Take 7.5 mg by mouth daily. Notes to patient: Morning 01/15/19   loratadine 10 MG tablet Commonly known as: CLARITIN Take 10 mg by mouth daily. Notes to patient: Morning 01/15/19   mometasone 50 MCG/ACT nasal spray Commonly known as: NASONEX Place 2 sprays into the nose daily. Notes to patient: Morning 01/15/19   pravastatin 20 MG  tablet Commonly known as: PRAVACHOL Take 20 mg by mouth at bedtime. Notes to patient: Before bed 01/14/19   primidone 250 MG tablet Commonly known as: MYSOLINE Take 250 mg by mouth at bedtime. Notes to patient: Before bed 01/14/19   primidone 50 MG tablet Commonly known as: MYSOLINE Take 50-100 mg by mouth 2 (two) times a day. Give 100 mg in the morning, and 50 mg at noon. Give noon dose at pace on center days Notes to patient: Morning 01/15/19   trospium 20 MG tablet Commonly known as: SANCTURA Take 20 mg by mouth daily. Notes to patient: Morning 01/15/19       Vitals:   01/14/19 0436 01/14/19 1307  BP: (!) 167/79 (!) 160/78  Pulse: 86 89  Resp: 14 16  Temp: 97.9 F (36.6 C) 98.5 F (36.9 C)  SpO2: 97% 97%    Skin clean, dry and intact without evidence of skin break down, no evidence of skin tears noted. IV catheter discontinued intact. Site without signs and symptoms of complications. Dressing and pressure applied. Pt denies pain at this time. No complaints noted.  An After Visit Summary was printed and given to the patient. Patient escorted and D/C home via EMS.  Brandy Oconnor

## 2019-01-14 NOTE — Discharge Summary (Signed)
[facility] [place] [facility]   PATIENT NAME: Brandy DressSarah Oconnor    MR#:  [id number]  DATE OF BIRTH:  [birth year]  DATE OF ADMISSION:  01/11/2019   ADMITTING PHYSICIAN: Houston SirenVivek J Sainani, MD  DATE OF DISCHARGE:  01/14/19  PRIMARY CARE PHYSICIAN: Patient, No Pcp Per   ADMISSION DIAGNOSIS:   Lactic acidosis [E87.2] Generalized abdominal pain [R10.84] Diverticulitis of large intestine without perforation or abscess without bleeding [K57.32] Sepsis, due to unspecified organism, unspecified whether acute organ dysfunction present (HCC) [A41.9]  DISCHARGE DIAGNOSIS:   Active Problems:   Diverticulitis   SECONDARY DIAGNOSIS:   Past Medical History:  Diagnosis Date  . Dementia (HCC)   . Depression   . Fatigue   . Femur fracture, right (HCC)   . GERD (gastroesophageal reflux disease)   . Hyperlipidemia   . Hypertension   . Hypothyroidism   . Osteoarthritis   . Tremor    benign essential tremor- tremors of voice, head and jaw  . Weakness     HOSPITAL COURSE:   85 year old female with past medical history significant for hypertension, dementia, Parkinson's disease, essential tremor, hypothyroidism, arthritis presents to hospital from home secondary to abdominal pain.  1.  Acute diverticulitis-recent admission for diverticulitis as well.  Patient has known history of diverticulosis with recurrent diverticulitis -CT of the abdomen reviewed.  No abscess noted.  Appreciate general surgery consult -received IV antibiotics with Zosyn -changed to oral Augmentin at discharge -Slowly advanced diet. Symptoms have  resolved at this time.  Tolerating regular diet well -WBC elevated- improved now  2.  Elevated troponin-most likely demand ischemia given her acute diverticulitis. -Appreciate cardiology consult.  Off heparin drip.   - echocardiogram with normal EF and no wall motion abnormalities.   -She denies any chest pain - no further cardiac work up at this time   3.  Parkinsons disease- continue Sinemet.  Also on primidone for essential tremors  4.  Hypothyroidism-continue Synthroid  5.  Hypertension-on atenolol and lisinopril  Ambulates with a walker at baseline discharge home today. Updated daughter yesterday Patient [facility]  DISCHARGE CONDITIONS:   Guarded  CONSULTS OBTAINED:   General Surgery consult Cardiology consult  DRUG ALLERGIES:   Allergies  Allergen Reactions  . Codeine Rash   DISCHARGE MEDICATIONS:   Allergies as of 01/14/2019      Reactions   Codeine Rash      Medication List    TAKE these medications   acetaminophen 500 MG tablet Commonly known as: TYLENOL Take 500-1,000 mg by mouth every 6 (six) hours as needed.   amoxicillin-clavulanate 875-125 MG tablet Commonly known as: AUGMENTIN Take 1 tablet by mouth every 12 (twelve) hours.   atenolol 50 MG tablet Commonly known as: TENORMIN Take 50 mg by mouth daily.   carbidopa-levodopa 10-100 MG tablet Commonly known as: SINEMET IR Take 1 tablet by mouth 2 (two) times daily.   cholecalciferol 25 MCG (1000 UT) tablet Commonly known as: VITAMIN D3 Take 1,000 Units by mouth daily.   ENSURE PLUS PO Take 237 mLs by mouth 2 (two) times a day.   ketotifen 0.025 % ophthalmic solution Commonly known as: ZADITOR Place 1 drop into both eyes daily.   levothyroxine 100 MCG tablet Commonly known as: SYNTHROID Take 100 mcg by mouth daily.   lidocaine 5 % Commonly known as: LIDODERM Place 1 patch onto the skin daily. Remove & Discard patch within 12 hours or as directed by MD  lisinopril 5 MG tablet Commonly known as: ZESTRIL Take 7.5 mg by mouth daily.   loratadine 10 MG tablet Commonly known as: CLARITIN Take 10 mg by mouth daily.   mometasone 50 MCG/ACT nasal spray Commonly known as: NASONEX Place 2 sprays into the nose daily.   pravastatin 20 MG tablet Commonly known as: PRAVACHOL Take 20 mg by mouth at bedtime.   primidone 250  MG tablet Commonly known as: MYSOLINE Take 250 mg by mouth at bedtime.   primidone 50 MG tablet Commonly known as: MYSOLINE Take 50-100 mg by mouth 2 (two) times a day. Give 100 mg in the morning, and 50 mg at noon. Give noon dose at pace on center days   trospium 20 MG tablet Commonly known as: SANCTURA Take 20 mg by mouth daily.        DISCHARGE INSTRUCTIONS:   1. PCP f/u in 1-2 weeks  DIET:   Cardiac diet  ACTIVITY:   Activity as tolerated  OXYGEN:   Home Oxygen: No.  Oxygen Delivery: room air  DISCHARGE LOCATION:   home   If you experience worsening of your admission symptoms, develop shortness of breath, life threatening emergency, suicidal or homicidal thoughts you must seek medical attention immediately by calling 911 or calling your MD immediately  if symptoms less severe.  You Must read complete instructions/literature along with all the possible adverse reactions/side effects for all the Medicines you take and that have been prescribed to you. Take any new Medicines after you have completely understood and accpet all the possible adverse reactions/side effects.   Please note  You were cared for by a hospitalist during your hospital stay. If you have any questions about your discharge medications or the care you received while you were in the hospital after you are discharged, you can call the unit and asked to speak with the hospitalist on call if the hospitalist that took care of you is not available. Once you are discharged, your primary care physician will handle any further medical issues. Please note that NO REFILLS for any discharge medications will be authorized once you are discharged, as it is imperative that you return to your primary care physician (or establish a relationship with a primary care physician if you do not have one) for your aftercare needs so that they can reassess your need for medications and monitor your lab values.    On the day  of Discharge:  VITAL SIGNS:   Blood pressure (!) 167/79, pulse 86, temperature 97.9 F (36.6 C), temperature source Oral, resp. rate 14, height 5' (1.524 m), weight 59.4 kg, SpO2 97 %.  PHYSICAL EXAMINATION:    GENERAL:  85 y.o.-year-old elderly patient lying in the bed with no acute distress.  EYES: Pupils equal, round, reactive to light and accommodation. No scleral icterus. Extraocular muscles intact.  HEENT: Head atraumatic, normocephalic. Oropharynx and nasopharynx clear.  NECK:  Supple, no jugular venous distention. No thyroid enlargement, no tenderness.  LUNGS: Normal breath sounds bilaterally, no wheezing, rales,rhonchi or crepitation. No use of accessory muscles of respiration. Decreased bibasilar breath sounds CARDIOVASCULAR: S1, S2 normal. No  rubs, or gallops. 2/6 systolic murmur present ABDOMEN: Soft, nontender, nondistended. Bowel sounds present. No organomegaly or mass.  EXTREMITIES: No pedal edema, cyanosis, or clubbing.  NEUROLOGIC: Cranial nerves II through XII are intact. Muscle strength equal in all extremities. Sensation intact. Gait not checked. Global weakness noted. PSYCHIATRIC: The patient is alert and oriented to self SKIN: No obvious rash, lesion,  or ulcer. Several skin tags noted around her neck  DATA REVIEW:   CBC Recent Labs  Lab 01/13/19 0449  WBC 8.3  HGB 12.3  HCT 37.5  PLT 141*    Chemistries  Recent Labs  Lab 01/11/19 1321  01/14/19 0530  NA 140   < > 144  K 4.3   < > 3.3*  CL 105   < > 111  CO2 24   < > 24  GLUCOSE 164*   < > 107*  BUN 17   < > 8  CREATININE 1.12*   < > 0.65  CALCIUM 8.9   < > 8.0*  AST 26  --   --   ALT 10  --   --   ALKPHOS 130*  --   --   BILITOT 0.5  --   --    < > = values in this interval not displayed.     Microbiology Results  Results for orders placed or performed during the hospital encounter of 01/11/19  Blood culture (routine x 2)     Status: None (Preliminary result)   Collection Time: 01/11/19   1:44 PM   Specimen: BLOOD  Result Value Ref Range Status   Specimen Description BLOOD RIGHT ANTECUBITAL  Final   Special Requests   Final    BOTTLES DRAWN AEROBIC AND ANAEROBIC Blood Culture adequate volume   Culture   Final    NO GROWTH 3 DAYS Performed at [facility], [address] [place], [place] [address]    Report Status PENDING  Incomplete  Blood culture (routine x 2)     Status: None (Preliminary result)   Collection Time: 01/11/19  1:44 PM   Specimen: BLOOD  Result Value Ref Range Status   Specimen Description BLOOD BLOOD RIGHT HAND  Final   Special Requests   Final    BOTTLES DRAWN AEROBIC ONLY Blood Culture adequate volume   Culture   Final    NO GROWTH 3 DAYS Performed at [facility], [address] [place], [place] [address]    Report Status PENDING  Incomplete  SARS Coronavirus 2 (CEPHEID- Performed in [facility] hospital lab), Hosp Order     Status: None   Collection Time: 01/11/19  5:29 PM   Specimen: Nasopharyngeal Swab  Result Value Ref Range Status   SARS Coronavirus 2 NEGATIVE NEGATIVE Final    Comment: (NOTE) If result is NEGATIVE SARS-CoV-2 target nucleic acids are NOT DETECTED. The SARS-CoV-2 RNA is generally detectable in upper and lower  respiratory specimens during the acute phase of infection. The lowest  concentration of SARS-CoV-2 viral copies this assay can detect is 250  copies / mL. A negative result does not preclude SARS-CoV-2 infection  and should not be used as the sole basis for treatment or other  patient management decisions.  A negative result may occur with  improper specimen collection / handling, submission of specimen other  than nasopharyngeal swab, presence of viral mutation(s) within the  areas targeted by this assay, and inadequate number of viral copies  (<250 copies / mL). A negative result must be combined with clinical  observations, patient history, and epidemiological information. If result is  POSITIVE SARS-CoV-2 target nucleic acids are DETECTED. The SARS-CoV-2 RNA is generally detectable in upper and lower  respiratory specimens dur ing the acute phase of infection.  Positive  results are indicative of active infection with SARS-CoV-2.  Clinical  correlation with patient history and other diagnostic  information is  necessary to determine patient infection status.  Positive results do  not rule out bacterial infection or co-infection with other viruses. If result is PRESUMPTIVE POSTIVE SARS-CoV-2 nucleic acids MAY BE PRESENT.   A presumptive positive result was obtained on the submitted specimen  and confirmed on repeat testing.  While 2019 novel coronavirus  (SARS-CoV-2) nucleic acids may be present in the submitted sample  additional confirmatory testing may be necessary for epidemiological  and / or clinical management purposes  to differentiate between  SARS-CoV-2 and other Sarbecovirus currently known to infect humans.  If clinically indicated additional testing with an alternate test  methodology [phone]([id number]) is advised. The SARS-CoV-2 RNA is generally  detectable in upper and lower respiratory sp ecimens during the acute  phase of infection. The expected result is Negative. Fact Sheet for Patients:  BoilerBrush.com.cyhttps://www.fda.gov/media/136312/download Fact Sheet for Healthcare Providers: https://pope.com/https://www.fda.gov/media/136313/download This test is not yet approved or cleared by the [place] FDA and has been authorized for detection and/or diagnosis of SARS-CoV-2 by FDA under an Emergency Use Authorization (EUA).  This EUA will remain in effect (meaning this test can be used) for the duration of the COVID-19 declaration under Section 564(b)(1) of the Act, 21 U.S.C. section 360bbb-3(b)(1), unless the authorization is terminated or revoked sooner. Performed at [facility], [address] [place], [place] [address]   C difficile quick scan w PCR reflex     Status: None    Collection Time: 01/12/19  2:16 PM   Specimen: STOOL  Result Value Ref Range Status   C Diff antigen NEGATIVE NEGATIVE Final   C Diff toxin NEGATIVE NEGATIVE Final   C Diff interpretation No C. difficile detected.  Final    Comment: Performed at [facility], [address] [place], [place] [address]    RADIOLOGY:  No results found.   Management plans discussed with the patient, family and they are in agreement.  CODE STATUS:     Code Status Orders  (From admission, onward)         Start     Ordered   01/11/19 2307  Do not attempt resuscitation (DNR)  Continuous    Question Answer Comment  In the event of cardiac or respiratory ARREST Do not call a "code blue"   In the event of cardiac or respiratory ARREST Do not perform Intubation, CPR, defibrillation or ACLS   In the event of cardiac or respiratory ARREST Use medication by any route, position, wound care, and other measures to relive pain and suffering. May use oxygen, suction and manual treatment of airway obstruction as needed for comfort.      01/11/19 2306        Code Status History    Date Active Date Inactive Code Status Order ID Comments User Context   12/17/2018 1858 01/03/2019 1745 Full Code [id number]  Bertrum SolSalary, Montell D, MD Inpatient   01/04/2016 1341 01/07/2016 2054 DNR [id number]  Adrian SaranMody, Sital, MD ED   05/24/2015 1703 05/27/2015 1733 Full Code [id number]  Kennedy BuckerMenz, Michael, MD Inpatient   05/23/2015 1954 05/24/2015 1703 Full Code [id number]  Enid BaasKalisetti, Shneur [place], MD Inpatient   Advance Care Planning Activity      TOTAL TIME TAKING CARE OF THIS PATIENT: 38 minutes.    Enid Baasadhika Eniola Cerullo M.D on 01/14/2019 at 11:12 AM  Between 7am to 6pm - Pager - [phone]  After 6pm go to [email or web address] - Therapist, nutritionalpassword [email or web address]  [facility] [place] [facility]  Office  [phone]  CC: Primary  care physician; Patient, No Pcp Per   Note: This dictation was prepared with Dragon dictation along with smaller phrase  technology. Any transcriptional errors that result from this process are unintentional.

## 2019-01-16 LAB — CULTURE, BLOOD (ROUTINE X 2)
Culture: NO GROWTH
Culture: NO GROWTH
Special Requests: ADEQUATE
Special Requests: ADEQUATE

## 2020-03-31 ENCOUNTER — Other Ambulatory Visit: Payer: Self-pay

## 2020-03-31 DIAGNOSIS — Z79890 Hormone replacement therapy (postmenopausal): Secondary | ICD-10-CM | POA: Insufficient documentation

## 2020-03-31 DIAGNOSIS — E876 Hypokalemia: Secondary | ICD-10-CM | POA: Insufficient documentation

## 2020-03-31 DIAGNOSIS — Z79899 Other long term (current) drug therapy: Secondary | ICD-10-CM | POA: Insufficient documentation

## 2020-03-31 DIAGNOSIS — R109 Unspecified abdominal pain: Secondary | ICD-10-CM | POA: Diagnosis not present

## 2020-03-31 DIAGNOSIS — F0390 Unspecified dementia without behavioral disturbance: Secondary | ICD-10-CM | POA: Insufficient documentation

## 2020-03-31 DIAGNOSIS — Z96641 Presence of right artificial hip joint: Secondary | ICD-10-CM | POA: Insufficient documentation

## 2020-03-31 DIAGNOSIS — S069X9A Unspecified intracranial injury with loss of consciousness of unspecified duration, initial encounter: Secondary | ICD-10-CM | POA: Diagnosis present

## 2020-03-31 DIAGNOSIS — Y92009 Unspecified place in unspecified non-institutional (private) residence as the place of occurrence of the external cause: Secondary | ICD-10-CM | POA: Insufficient documentation

## 2020-03-31 DIAGNOSIS — I10 Essential (primary) hypertension: Secondary | ICD-10-CM | POA: Diagnosis not present

## 2020-03-31 DIAGNOSIS — W19XXXA Unspecified fall, initial encounter: Secondary | ICD-10-CM | POA: Insufficient documentation

## 2020-03-31 DIAGNOSIS — E860 Dehydration: Secondary | ICD-10-CM | POA: Diagnosis not present

## 2020-03-31 DIAGNOSIS — R55 Syncope and collapse: Secondary | ICD-10-CM | POA: Diagnosis not present

## 2020-03-31 DIAGNOSIS — E039 Hypothyroidism, unspecified: Secondary | ICD-10-CM | POA: Diagnosis not present

## 2020-03-31 DIAGNOSIS — Z20822 Contact with and (suspected) exposure to covid-19: Secondary | ICD-10-CM | POA: Insufficient documentation

## 2020-03-31 LAB — BASIC METABOLIC PANEL
Anion gap: 14 (ref 5–15)
BUN: 15 mg/dL (ref 8–23)
CO2: 27 mmol/L (ref 22–32)
Calcium: 9.2 mg/dL (ref 8.9–10.3)
Chloride: 99 mmol/L (ref 98–111)
Creatinine, Ser: 0.95 mg/dL (ref 0.44–1.00)
GFR calc Af Amer: >60 mL/min (ref >60)
GFR calc non Af Amer: 54 mL/min — ABNORMAL LOW (ref >60)
Glucose, Bld: 114 mg/dL — ABNORMAL HIGH (ref 70–99)
Potassium: 3.1 mmol/L — ABNORMAL LOW (ref 3.5–5.1)
Sodium: 140 mmol/L (ref 135–145)

## 2020-03-31 LAB — CBC
HCT: 46.8 % — ABNORMAL HIGH (ref 36.0–46.0)
Hemoglobin: 15.9 g/dL — ABNORMAL HIGH (ref 12.0–15.0)
MCH: 30.3 pg (ref 26.0–34.0)
MCHC: 34 g/dL (ref 30.0–36.0)
MCV: 89.3 fL (ref 80.0–100.0)
Platelets: 201 10*3/uL (ref 150–400)
RBC: 5.24 MIL/uL — ABNORMAL HIGH (ref 3.87–5.11)
RDW: 13.6 % (ref 11.5–15.5)
WBC: 9.1 10*3/uL (ref 4.0–10.5)
nRBC: 0 % (ref 0.0–0.2)

## 2020-03-31 LAB — TROPONIN I (HIGH SENSITIVITY): Troponin I (High Sensitivity): 6 ng/L (ref <18)

## 2020-03-31 NOTE — ED Triage Notes (Signed)
Patient to triage via wheelchair from home for syncopal episode.  Per EMS patient was sitting on toilet having diarrhea when she appeared to pass out for a few seconds to the daughter.  EMS vitals -- hr 83, bp 112/70, pulse oxi 97% on room air, cbg 128,  Has saline loc to right antecub via 20 g angiocath

## 2020-04-01 ENCOUNTER — Encounter: Payer: Self-pay | Admitting: Radiology

## 2020-04-01 ENCOUNTER — Emergency Department: Payer: Medicare (Managed Care)

## 2020-04-01 ENCOUNTER — Emergency Department
Admission: EM | Admit: 2020-04-01 | Discharge: 2020-04-01 | Disposition: A | Discharge status: Home / Self Care | Payer: Medicare (Managed Care) | Attending: Emergency Medicine | Admitting: Emergency Medicine

## 2020-04-01 DIAGNOSIS — E876 Hypokalemia: Secondary | ICD-10-CM

## 2020-04-01 DIAGNOSIS — E860 Dehydration: Secondary | ICD-10-CM

## 2020-04-01 DIAGNOSIS — R55 Syncope and collapse: Secondary | ICD-10-CM

## 2020-04-01 LAB — URINALYSIS, COMPLETE (UACMP) WITH MICROSCOPIC
Bacteria, UA: NONE SEEN
Bilirubin Urine: NEGATIVE
Glucose, UA: NEGATIVE mg/dL
Ketones, ur: NEGATIVE mg/dL
Leukocytes,Ua: NEGATIVE
Nitrite: NEGATIVE
Protein, ur: NEGATIVE mg/dL
Specific Gravity, Urine: 1.016 (ref 1.005–1.030)
pH: 6 (ref 5.0–8.0)

## 2020-04-01 LAB — MAGNESIUM: Magnesium: 2 mg/dL (ref 1.7–2.4)

## 2020-04-01 LAB — HEPATIC FUNCTION PANEL
ALT: 16 U/L (ref 0–44)
AST: 21 U/L (ref 15–41)
Albumin: 4.1 g/dL (ref 3.5–5.0)
Alkaline Phosphatase: 77 U/L (ref 38–126)
Bilirubin, Direct: <0.1 mg/dL (ref 0.0–0.2)
Total Bilirubin: 0.7 mg/dL (ref 0.3–1.2)
Total Protein: 6.8 g/dL (ref 6.5–8.1)

## 2020-04-01 LAB — TROPONIN I (HIGH SENSITIVITY): Troponin I (High Sensitivity): 6 ng/L (ref <18)

## 2020-04-01 LAB — BRAIN NATRIURETIC PEPTIDE: B Natriuretic Peptide: 145.4 pg/mL — ABNORMAL HIGH (ref 0.0–100.0)

## 2020-04-01 LAB — SARS CORONAVIRUS 2 BY RT PCR (HOSPITAL ORDER, PERFORMED IN ~~LOC~~ HOSPITAL LAB): SARS Coronavirus 2: NEGATIVE

## 2020-04-01 MED ORDER — POTASSIUM CHLORIDE CRYS ER 20 MEQ PO TBCR
40.0000 meq | EXTENDED_RELEASE_TABLET | Freq: Once | ORAL | Status: AC
  Administered 2020-04-01: 40 meq via ORAL
  Filled 2020-04-01: qty 2

## 2020-04-01 MED ORDER — IOHEXOL 300 MG/ML  SOLN
75.0000 mL | Freq: Once | INTRAMUSCULAR | Status: AC | PRN
  Administered 2020-04-01: 75 mL via INTRAVENOUS

## 2020-04-01 MED ORDER — PRIMIDONE 50 MG PO TABS
100.0000 mg | ORAL_TABLET | Freq: Once | ORAL | Status: AC
  Administered 2020-04-01: 100 mg via ORAL
  Filled 2020-04-01: qty 2

## 2020-04-01 MED ORDER — LACTATED RINGERS IV BOLUS
500.0000 mL | Freq: Once | INTRAVENOUS | Status: AC
  Administered 2020-04-01: 500 mL via INTRAVENOUS

## 2020-04-01 MED ORDER — LISINOPRIL 5 MG PO TABS
7.5000 mg | ORAL_TABLET | Freq: Every day | ORAL | Status: DC
  Administered 2020-04-01: 7.5 mg via ORAL
  Filled 2020-04-01: qty 2

## 2020-04-01 MED ORDER — CARBIDOPA-LEVODOPA 10-100 MG PO TABS
1.0000 | ORAL_TABLET | Freq: Two times a day (BID) | ORAL | Status: DC
  Administered 2020-04-01: 1 via ORAL
  Filled 2020-04-01: qty 1

## 2020-04-01 NOTE — ED Notes (Signed)
Call placed to daughter Boyd Kerbs who states she will call [facility] to pick [facility] pt but they do not open until 8am. Will revisit transport at 8am.

## 2020-04-01 NOTE — ED Provider Notes (Signed)
[facility] Emergency Department Provider Note  ____________________________________________   First MD Initiated Contact with Patient 04/01/20 0424     (approximate)  I have reviewed the triage vital signs and the nursing notes.   HISTORY  Chief Complaint Loss of Consciousness   HPI Brandy DressSarah Oconnor is a 86 y.o. female with PMH of HTN, dementia, Parkinson's disease, essential tremor, hypothyroidism, arthritis depression, and hip fracture dependent on a walker for ambulation who presents for assessment after witnessed syncopal episode earlier today.  History is obtained from both patient and her daughter who I spoke with over the phone who the patient lives with and witnessed the episode.  Per patient and her daughter patient was on the toilet having some diarrhea that began today when her daughter witnessed her pass out for less than a minute.  Patient did not fall or hit her head.  Patient does not remember exactly what happened.  Her daughter states she has not been eating a lot lately and is wondering if she may been dehydrated.  Both patient and daughter deny any cough, fevers, recent complaints of chest pain, back pain, headache, earache, sore throat, rash, extremity pain, or other acute complaints.  Patient endorses some crampy abdominal pain with her diarrhea today and denies any urinary symptoms.  No clear alleviating aggravating or precipitating factors.  Patient's daughter states this has happened before and also notes the patient has a history of diverticulitis and is wondering if that has contributed to her diarrhea.         Past Medical History:  Diagnosis Date  . Dementia (HCC)   . Depression   . Fatigue   . Femur fracture, right (HCC)   . GERD (gastroesophageal reflux disease)   . Hyperlipidemia   . Hypertension   . Hypothyroidism   . Osteoarthritis   . Tremor    benign essential tremor- tremors of voice, head and jaw  . Weakness     Patient  Active Problem List   Diagnosis Date Noted  . Diverticulitis 12/17/2018  . Encephalopathy acute 01/04/2016  . Hip fracture (HCC) 05/23/2015    Past Surgical History:  Procedure Laterality Date  . ABDOMINAL HYSTERECTOMY    . APPENDECTOMY    . CATARACT EXTRACTION W/PHACO Right 11/02/2017   Procedure: CATARACT EXTRACTION PHACO AND INTRAOCULAR LENS PLACEMENT (IOC);  Surgeon: Galen ManilaPorfilio, William, MD;  Location: [facility] ORS;  Service: Ophthalmology;  Laterality: Right;  US 00:37 AP% 15.0 CDE 5.58 Fluid pack lot # [id number]  . CATARACT EXTRACTION W/PHACO Left 12/07/2017   Procedure: CATARACT EXTRACTION PHACO AND INTRAOCULAR LENS PLACEMENT (IOC);  Surgeon: Galen ManilaPorfilio, William, MD;  Location: [facility] ORS;  Service: Ophthalmology;  Laterality: Left;  US 00:34 AP% 14.2 CDE 4.88 Fluid pack lot # [id number]  . CHOLECYSTECTOMY    . THYROIDECTOMY, PARTIAL    . TOTAL HIP ARTHROPLASTY Right 05/24/2015   Procedure: TOTAL HIP ARTHROPLASTY ANTERIOR APPROACH;  Surgeon: Kennedy BuckerMichael Menz, MD;  Location: [facility] ORS;  Service: Orthopedics;  Laterality: Right;    Prior to Admission medications   Medication Sig Start Date End Date Taking? Authorizing Provider  acetaminophen (TYLENOL) 500 MG tablet Take 500-1,000 mg by mouth every 6 (six) hours as needed.    [provider]  atenolol (TENORMIN) 50 MG tablet Take 50 mg by mouth daily.    [provider]  carbidopa-levodopa (SINEMET IR) 10-100 MG tablet Take 1 tablet by mouth 2 (two) times daily.    [provider]  cholecalciferol (VITAMIN D3)  25 MCG (1000 UT) tablet Take 1,000 Units by mouth daily.    [provider]  ketotifen (ZADITOR) 0.025 % ophthalmic solution Place 1 drop into both eyes daily.    [provider]  levothyroxine (SYNTHROID, LEVOTHROID) 100 MCG tablet Take 100 mcg by mouth daily.    [provider]  lidocaine (LIDODERM) 5 % Place 1 patch onto the skin daily. Remove & Discard patch within 12 hours or as  directed by MD    [provider]  lisinopril (ZESTRIL) 5 MG tablet Take 7.5 mg by mouth daily.     [provider]  loratadine (CLARITIN) 10 MG tablet Take 10 mg by mouth daily.    [provider]  mometasone (NASONEX) 50 MCG/ACT nasal spray Place 2 sprays into the nose daily.    [provider]  Nutritional Supplements (ENSURE PLUS PO) Take 237 mLs by mouth 2 (two) times a day.    [provider]  pravastatin (PRAVACHOL) 20 MG tablet Take 20 mg by mouth at bedtime.    [provider]  primidone (MYSOLINE) 250 MG tablet Take 250 mg by mouth at bedtime.     [provider]  primidone (MYSOLINE) 50 MG tablet Take 50-100 mg by mouth 2 (two) times a day. Give 100 mg in the morning, and 50 mg at noon. Give noon dose at pace on center days    [provider]  trospium (SANCTURA) 20 MG tablet Take 20 mg by mouth daily.    [provider]    Allergies Codeine  Family History  Problem Relation Age of Onset  . Diabetes Mellitus II Mother   . Other Father        Tremors    Social History Social History   Tobacco Use  . Smoking status: Never Smoker  . Smokeless tobacco: Current User    Types: Snuff  Vaping Use  . Vaping Use: Never used  Substance Use Topics  . Alcohol use: No  . Drug use: No    Review of Systems  Review of Systems  Constitutional: Negative for chills and fever.  HENT: Negative for sore throat.   Eyes: Negative for pain.  Respiratory: Negative for cough and stridor.   Cardiovascular: Negative for chest pain.  Gastrointestinal: Positive for diarrhea. Negative for vomiting.  Genitourinary: Negative for dysuria.  Musculoskeletal: Negative for myalgias.  Skin: Negative for rash.  Neurological: Positive for tremors and loss of consciousness. Negative for seizures and headaches.  Psychiatric/Behavioral: Negative for suicidal ideas.  All other systems reviewed and are negative.      ____________________________________________   PHYSICAL EXAM:  VITAL SIGNS: ED Triage Vitals  Enc Vitals Group     BP 03/31/20 2300 (!) 153/64     Pulse Rate 03/31/20 2300 68     Resp 03/31/20 2300 20     Temp 03/31/20 2300 97.8 F (36.6 C)     Temp Source 03/31/20 2300 Oral     SpO2 03/31/20 2300 98 %     Weight 03/31/20 2301 115 lb (52.2 kg)     Height 03/31/20 2301 5' (1.524 m)     Head Circumference --      Peak Flow --      Pain Score --      Pain Loc --      Pain Edu? --      Excl. in GC? --    Vitals:   04/01/20 0630 04/01/20 0655  BP: (!) 148/90  Pulse: (!) 101 84  Resp:    Temp:    SpO2: 99% 97%   Physical Exam Vitals and nursing note reviewed.  Constitutional:      General: She is not in acute distress.    Appearance: She is well-developed.  HENT:     Head: Normocephalic and atraumatic.     Right Ear: External ear normal.     Left Ear: External ear normal.     Nose: Nose normal.     Mouth/Throat:     Mouth: Mucous membranes are dry.  Eyes:     Conjunctiva/sclera: Conjunctivae normal.  Cardiovascular:     Rate and Rhythm: Normal rate and regular rhythm.     Heart sounds: No murmur heard.   Pulmonary:     Effort: Pulmonary effort is normal. No respiratory distress.     Breath sounds: Normal breath sounds.  Abdominal:     Palpations: Abdomen is soft.     Tenderness: There is no abdominal tenderness.  Musculoskeletal:     Cervical back: Neck supple. No rigidity.     Right lower leg: No edema.     Left lower leg: No edema.  Skin:    General: Skin is warm and dry.     Capillary Refill: Capillary refill takes 2 to 3 seconds.  Neurological:     Mental Status: She is alert. Mental status is at baseline. She is disoriented.     Motor: Tremor present.     Cranial nerves II through XII grossly intact.  PERRLA.  EOMI.  Patient has symmetric strength in her bilateral upper and lower extremities.  Sensation intact to light touch throughout lower  extremities. ____________________________________________   LABS (all labs ordered are listed, but only abnormal results are displayed)  Labs Reviewed  BASIC METABOLIC PANEL - Abnormal; Notable for the following components:      Result Value   Potassium 3.1 (*)    Glucose, Bld 114 (*)    GFR calc non Af Amer 54 (*)    All other components within normal limits  CBC - Abnormal; Notable for the following components:   RBC 5.24 (*)    Hemoglobin 15.9 (*)    HCT 46.8 (*)    All other components within normal limits  URINALYSIS, COMPLETE (UACMP) WITH MICROSCOPIC - Abnormal; Notable for the following components:   Color, Urine YELLOW (*)    APPearance CLEAR (*)    Hgb urine dipstick SMALL (*)    All other components within normal limits  BRAIN NATRIURETIC PEPTIDE - Abnormal; Notable for the following components:   B Natriuretic Peptide 145.4 (*)    All other components within normal limits  SARS CORONAVIRUS 2 BY RT PCR (HOSPITAL ORDER, PERFORMED IN [facility])  GASTROINTESTINAL PANEL BY PCR, STOOL (REPLACES STOOL CULTURE)  C DIFFICILE QUICK SCREEN W PCR REFLEX  HEPATIC FUNCTION PANEL  MAGNESIUM  TROPONIN I (HIGH SENSITIVITY)  TROPONIN I (HIGH SENSITIVITY)   ____________________________________________  EKG  Sinus rhythm with a ventricular rate of 70, normal axis, unremarkable intervals, artifact in leads V4, V5 and inverted T waves in the anterior leads as well as leads I and aVL without other clear evidence of acute ischemia.  His T wave inversions in anterior leads are unchanged when compared to prior.   ____________________________________________  RADIOLOGY   Official radiology report(s): CT ABDOMEN PELVIS W CONTRAST  Result Date: 04/01/2020 CLINICAL DATA:  Acute, nonlocalized abdominal pain, syncope, diarrhea EXAM: CT ABDOMEN AND PELVIS WITH  CONTRAST TECHNIQUE: Multidetector CT imaging of the abdomen and pelvis was performed using the standard protocol  following bolus administration of intravenous contrast. CONTRAST:  75mL OMNIPAQUE IOHEXOL 300 MG/ML  SOLN COMPARISON:  01/11/2019 FINDINGS: Lower chest: The visualized lung bases are clear bilaterally. The visualized heart and pericardium are unremarkable. Small hiatal hernia. Hepatobiliary: Cholecystectomy has been performed. Tiny hypodensities within the left hepatic lobe are not well characterize, but likely represent tiny hepatic cysts. The liver is otherwise unremarkable. No intra or extrahepatic biliary ductal dilation. Pancreas: Unremarkable Spleen: Unremarkable Adrenals/Urinary Tract: The adrenal glands are unremarkable. The kidneys are normal in size and position. Tiny cortical cysts are seen bilaterally. The kidneys are otherwise unremarkable. The bladder is partially obscured by streak artifact from right total hip arthroplasty, but demonstrates an unchanged bladder diverticulum involving the posterolateral left bladder wall. The bladder is otherwise unremarkable. Stomach/Bowel: Severe sigmoid diverticulosis without superimposed diverticulitis. The large and small bowel are otherwise unremarkable. Stomach unremarkable. No free intraperitoneal gas or fluid. Vascular/Lymphatic: The abdominal vasculature is age-appropriate with moderate aortoiliac atherosclerotic calcification. No aneurysm. There is no pathologic adenopathy within the abdomen and pelvis. Reproductive: Mild central prostatic hypertrophy. Seminal vesicles are unremarkable. Other: Rectum unremarkable. Musculoskeletal: Right total hip arthroplasty has been performed. Osseous structures are age-appropriate. No lytic or blastic bone lesions are seen. No acute bone abnormality. IMPRESSION: No definite radiographic explanation for the patient's reported symptoms. Severe sigmoid diverticulosis without evidence of diverticulitis. Aortic Atherosclerosis (ICD10-I70.0). Electronically Signed   By: Helyn Numbers MD   On: 04/01/2020 05:22   DG Chest  Port 1 View  Result Date: 04/01/2020 CLINICAL DATA:  Syncope EXAM: PORTABLE CHEST 1 VIEW COMPARISON:  01/11/2019 FINDINGS: The heart size and mediastinal contours are within normal limits. Both lungs are clear. The visualized skeletal structures are unremarkable. IMPRESSION: No active disease. Electronically Signed   By: Helyn Numbers MD   On: 04/01/2020 04:46    ____________________________________________   PROCEDURES  Procedure(s) performed (including Critical Care):  .1-3 Lead EKG Interpretation Performed by: Gilles Chiquito, MD Authorized by: Gilles Chiquito, MD     Interpretation: normal     ECG rate assessment: normal     Rhythm: sinus rhythm     Ectopy: none     Conduction: normal       ____________________________________________   INITIAL IMPRESSION / ASSESSMENT AND PLAN / ED COURSE        Patient presents with above-stated history exam for assessment after a witnessed syncopal episode that occurred while she was having some diarrhea today.  Patient is afebrile hemodynamically stable on arrival.  Exam as above.  Overall patient's history, exam, and ED work-up is most concerning for syncope secondary to mild dehydration.  Low suspicion for ACS given patient denies any chest pain with relatively reassuring EKG and to nonelevated troponins obtained over 2 hours.  Low suspicion for PE as patient denies any chest pain or shortness of breath and she is not tachycardic or tachypneic or hypoxic.  She does not appear volume overloaded on chest x-ray or exam at low suspicion for CHF.  No arrhythmia identified on ECG.  Aside from some mild hypokalemia no significant left letter metabolic derangements.  Patient is not anemic.  In addition she has no focal deficits to suggest CVA no Storq or exam factors to suggest toxic ingestion or intoxication from alcohol or drugs. CT abdomen pelvis shows diverticuli without evidence of diverticulitis or other acute intra-abdominal process. No  evidence of pyelonephritis, appendicitis,  perforated viscus or other immediate life-threatening process. UA is not suggestive of cystitis. Patient is not orthostatic. Overall presentation and work-up is concerning for dehydration secondary to some diarrhea from nonspecific enteritis. Patient unable provide stool sample in the ED for C. difficile for GI pathogen panel testing. Given stable vital signs with reassuring work-up patient feeling better after IV fluid hydration and potassium completion I believe she is safe for discharge with plan for outpatient follow-up. Discharged stable condition. Strict return precautions advised discussed.  ____________________________________________   FINAL CLINICAL IMPRESSION(S) / ED DIAGNOSES  Final diagnoses:  Syncope, unspecified syncope type  Dehydration  Hypokalemia    Medications  carbidopa-levodopa (SINEMET IR) 10-100 MG per tablet immediate release 1 tablet (1 tablet Oral Given 04/01/20 0530)  lisinopril (ZESTRIL) tablet 7.5 mg (7.5 mg Oral Given 04/01/20 0529)  potassium chloride SA (KLOR-CON) CR tablet 40 mEq (40 mEq Oral Given 04/01/20 0440)  lactated ringers bolus 500 mL (0 mLs Intravenous Stopped 04/01/20 0086)  primidone (MYSOLINE) tablet 100 mg (100 mg Oral Given 04/01/20 0530)  iohexol (OMNIPAQUE) 300 MG/ML solution 75 mL (75 mLs Intravenous Contrast Given 04/01/20 0502)     ED Discharge Orders    None       Note:  This document was prepared using Dragon voice recognition software and may include unintentional dictation errors.   Gilles Chiquito, MD 04/01/20 [phone]

## 2020-07-17 ENCOUNTER — Emergency Department: Payer: Medicare (Managed Care)

## 2020-07-17 ENCOUNTER — Other Ambulatory Visit: Payer: Self-pay

## 2020-07-17 ENCOUNTER — Observation Stay
Admission: EM | Admit: 2020-07-17 | Discharge: 2020-07-18 | Disposition: A | Discharge status: Home / Self Care | Payer: Medicare (Managed Care) | Attending: Family Medicine | Admitting: Family Medicine

## 2020-07-17 DIAGNOSIS — Z1389 Encounter for screening for other disorder: Secondary | ICD-10-CM

## 2020-07-17 DIAGNOSIS — R296 Repeated falls: Secondary | ICD-10-CM

## 2020-07-17 DIAGNOSIS — R937 Abnormal findings on diagnostic imaging of other parts of musculoskeletal system: Secondary | ICD-10-CM

## 2020-07-17 DIAGNOSIS — R55 Syncope and collapse: Secondary | ICD-10-CM | POA: Diagnosis not present

## 2020-07-17 DIAGNOSIS — I959 Hypotension, unspecified: Secondary | ICD-10-CM

## 2020-07-17 DIAGNOSIS — R130 Aphagia: Secondary | ICD-10-CM | POA: Diagnosis not present

## 2020-07-17 DIAGNOSIS — M4802 Spinal stenosis, cervical region: Secondary | ICD-10-CM | POA: Diagnosis not present

## 2020-07-17 DIAGNOSIS — M109 Gout, unspecified: Secondary | ICD-10-CM | POA: Diagnosis present

## 2020-07-17 DIAGNOSIS — Z79899 Other long term (current) drug therapy: Secondary | ICD-10-CM | POA: Diagnosis not present

## 2020-07-17 DIAGNOSIS — R29818 Other symptoms and signs involving the nervous system: Secondary | ICD-10-CM

## 2020-07-17 DIAGNOSIS — F0280 Dementia in other diseases classified elsewhere without behavioral disturbance: Secondary | ICD-10-CM | POA: Diagnosis not present

## 2020-07-17 DIAGNOSIS — Z20822 Contact with and (suspected) exposure to covid-19: Secondary | ICD-10-CM | POA: Insufficient documentation

## 2020-07-17 DIAGNOSIS — E039 Hypothyroidism, unspecified: Secondary | ICD-10-CM | POA: Diagnosis present

## 2020-07-17 DIAGNOSIS — Z96641 Presence of right artificial hip joint: Secondary | ICD-10-CM | POA: Insufficient documentation

## 2020-07-17 DIAGNOSIS — I639 Cerebral infarction, unspecified: Secondary | ICD-10-CM

## 2020-07-17 DIAGNOSIS — W19XXXA Unspecified fall, initial encounter: Secondary | ICD-10-CM

## 2020-07-17 DIAGNOSIS — G20 Parkinson's disease: Secondary | ICD-10-CM | POA: Diagnosis not present

## 2020-07-17 DIAGNOSIS — R4789 Other speech disturbances: Secondary | ICD-10-CM

## 2020-07-17 DIAGNOSIS — R42 Dizziness and giddiness: Secondary | ICD-10-CM

## 2020-07-17 DIAGNOSIS — I10 Essential (primary) hypertension: Secondary | ICD-10-CM | POA: Diagnosis not present

## 2020-07-17 LAB — COMPREHENSIVE METABOLIC PANEL
ALT: 9 U/L (ref 0–44)
AST: 17 U/L (ref 15–41)
Albumin: 3.2 g/dL — ABNORMAL LOW (ref 3.5–5.0)
Alkaline Phosphatase: 69 U/L (ref 38–126)
Anion gap: 7 (ref 5–15)
BUN: 21 mg/dL (ref 8–23)
CO2: 28 mmol/L (ref 22–32)
Calcium: 8.3 mg/dL — ABNORMAL LOW (ref 8.9–10.3)
Chloride: 107 mmol/L (ref 98–111)
Creatinine, Ser: 0.83 mg/dL (ref 0.44–1.00)
GFR, Estimated: >60 mL/min (ref >60)
Glucose, Bld: 135 mg/dL — ABNORMAL HIGH (ref 70–99)
Potassium: 4.4 mmol/L (ref 3.5–5.1)
Sodium: 142 mmol/L (ref 135–145)
Total Bilirubin: 0.8 mg/dL (ref 0.3–1.2)
Total Protein: 5.6 g/dL — ABNORMAL LOW (ref 6.5–8.1)

## 2020-07-17 LAB — CBC WITH DIFFERENTIAL/PLATELET
Abs Immature Granulocytes: 0.09 10*3/uL — ABNORMAL HIGH (ref 0.00–0.07)
Basophils Absolute: 0.1 10*3/uL (ref 0.0–0.1)
Basophils Relative: 1 %
Eosinophils Absolute: 0.2 10*3/uL (ref 0.0–0.5)
Eosinophils Relative: 1 %
HCT: 43.5 % (ref 36.0–46.0)
Hemoglobin: 14.9 g/dL (ref 12.0–15.0)
Immature Granulocytes: 1 %
Lymphocytes Relative: 10 %
Lymphs Abs: 1.5 10*3/uL (ref 0.7–4.0)
MCH: 30.3 pg (ref 26.0–34.0)
MCHC: 34.3 g/dL (ref 30.0–36.0)
MCV: 88.6 fL (ref 80.0–100.0)
Monocytes Absolute: 0.8 10*3/uL (ref 0.1–1.0)
Monocytes Relative: 5 %
Neutro Abs: 12.3 10*3/uL — ABNORMAL HIGH (ref 1.7–7.7)
Neutrophils Relative %: 82 %
Platelets: 184 10*3/uL (ref 150–400)
RBC: 4.91 MIL/uL (ref 3.87–5.11)
RDW: 12.4 % (ref 11.5–15.5)
WBC: 14.9 10*3/uL — ABNORMAL HIGH (ref 4.0–10.5)
nRBC: 0 % (ref 0.0–0.2)

## 2020-07-17 LAB — BRAIN NATRIURETIC PEPTIDE: B Natriuretic Peptide: 249.3 pg/mL — ABNORMAL HIGH (ref 0.0–100.0)

## 2020-07-17 LAB — TROPONIN I (HIGH SENSITIVITY): Troponin I (High Sensitivity): 12 ng/L (ref <18)

## 2020-07-17 LAB — LACTIC ACID, PLASMA: Lactic Acid, Venous: 1.8 mmol/L (ref 0.5–1.9)

## 2020-07-17 NOTE — ED Provider Notes (Addendum)
[facility] Emergency Department Provider Note   ____________________________________________   Event Date/Time   First MD Initiated Contact with Patient 07/17/20 2122     (approximate)  I have reviewed the triage vital signs and the nursing notes.   HISTORY  Chief Complaint Hypotension  History limited by patient not speaking  HPI Brandy Oconnor is a 87 y.o. female who comes from [facility] independent living.  Patient apparently fell and/or passed out today.  She is done that twice in the last 2 days.  EMS reports she was hypotensive at 70.  Here in the emergency room patient's blood pressures come back up but she cannot speak to me she nods her head or shakes her head  but cannot seem to form words.  She does not seem to have any headache or neck pain or back pain or chest pain or belly pain or leg pain.  She does have a skin knee on the right side is very mild        Past Medical History:  Diagnosis Date  . Dementia (HCC)   . Depression   . Fatigue   . Femur fracture, right (HCC)   . GERD (gastroesophageal reflux disease)   . Hyperlipidemia   . Hypertension   . Hypothyroidism   . Osteoarthritis   . Tremor    benign essential tremor- tremors of voice, head and jaw  . Weakness     Patient Active Problem List   Diagnosis Date Noted  . Diverticulitis 12/17/2018  . Encephalopathy acute 01/04/2016  . Hip fracture (HCC) 05/23/2015    Past Surgical History:  Procedure Laterality Date  . ABDOMINAL HYSTERECTOMY    . APPENDECTOMY    . CATARACT EXTRACTION W/PHACO Right 11/02/2017   Procedure: CATARACT EXTRACTION PHACO AND INTRAOCULAR LENS PLACEMENT (IOC);  Surgeon: Galen Manila, MD;  Location: [facility] ORS;  Service: Ophthalmology;  Laterality: Right;  [place] 00:37 AP% 15.0 CDE 5.58 Fluid pack lot # [id number]  . CATARACT EXTRACTION W/PHACO Left 12/07/2017   Procedure: CATARACT EXTRACTION PHACO AND INTRAOCULAR LENS PLACEMENT (IOC);  Surgeon:  Galen Manila, MD;  Location: [facility] ORS;  Service: Ophthalmology;  Laterality: Left;  [place] 00:34 AP% 14.2 CDE 4.88 Fluid pack lot # [id number]  . CHOLECYSTECTOMY    . THYROIDECTOMY, PARTIAL    . TOTAL HIP ARTHROPLASTY Right 05/24/2015   Procedure: TOTAL HIP ARTHROPLASTY ANTERIOR APPROACH;  Surgeon: Kennedy Bucker, MD;  Location: [facility] ORS;  Service: Orthopedics;  Laterality: Right;    Prior to Admission medications   Medication Sig Start Date End Date Taking? Authorizing Provider  acetaminophen (TYLENOL) 500 MG tablet Take 500-1,000 mg by mouth every 6 (six) hours as needed.    [provider]  atenolol (TENORMIN) 50 MG tablet Take 50 mg by mouth daily.    [provider]  carbidopa-levodopa (SINEMET IR) 10-100 MG tablet Take 1 tablet by mouth 2 (two) times daily.    [provider]  cholecalciferol (VITAMIN D3) 25 MCG (1000 UT) tablet Take 1,000 Units by mouth daily.    [provider]  ketotifen (ZADITOR) 0.025 % ophthalmic solution Place 1 drop into both eyes daily.    [provider]  levothyroxine (SYNTHROID, LEVOTHROID) 100 MCG tablet Take 100 mcg by mouth daily.    [provider]  lidocaine (LIDODERM) 5 % Place 1 patch onto the skin daily. Remove & Discard patch within 12 hours or as directed by MD    [provider]  lisinopril (ZESTRIL)  5 MG tablet Take 7.5 mg by mouth daily.     [provider]  loratadine (CLARITIN) 10 MG tablet Take 10 mg by mouth daily.    [provider]  mometasone (NASONEX) 50 MCG/ACT nasal spray Place 2 sprays into the nose daily.    [provider]  Nutritional Supplements (ENSURE PLUS PO) Take 237 mLs by mouth 2 (two) times a day.    [provider]  pravastatin (PRAVACHOL) 20 MG tablet Take 20 mg by mouth at bedtime.    [provider]  primidone (MYSOLINE) 250 MG tablet Take 250 mg by mouth at bedtime.     [provider]  primidone  (MYSOLINE) 50 MG tablet Take 50-100 mg by mouth 2 (two) times a day. Give 100 mg in the morning, and 50 mg at noon. Give noon dose at pace on center days    [provider]  trospium (SANCTURA) 20 MG tablet Take 20 mg by mouth daily.    [provider]    Allergies Codeine  Family History  Problem Relation Age of Onset  . Diabetes Mellitus II Mother   . Other Father        Tremors    Social History Social History   Tobacco Use  . Smoking status: Never Smoker  . Smokeless tobacco: Current User    Types: Snuff  Vaping Use  . Vaping Use: Never used  Substance Use Topics  . Alcohol use: No  . Drug use: No    Review of Systems  ___Unable to obtain _________________________________________   PHYSICAL EXAM:  VITAL SIGNS: ED Triage Vitals  Enc Vitals Group     BP 07/17/20 2101 (!) 142/86     Pulse Rate 07/17/20 2101 86     Resp 07/17/20 2101 14     Temp 07/17/20 2101 97.6 F (36.4 C)     Temp Source 07/17/20 2101 Oral     SpO2 07/17/20 2101 100 %     Weight 07/17/20 2102 115 lb 8.3 oz (52.4 kg)     Height 07/17/20 2102 5' (1.524 m)     Head Circumference --      Peak Flow --      Pain Score 07/17/20 2113 1     Pain Loc --      Pain Edu? --      Excl. in Yell? --     Constitutional: Alert trying to speak but cannot Eyes: Conjunctivae are normal. PER. Head: Atraumatic. Nose: No congestion/rhinnorhea. Mouth/Throat: Mucous membranes are moist.  Oropharynx non-erythematous. Neck: No stridor.  Not tender to palpation  cardiovascular: Normal rate, regular rhythm. Grossly normal heart sounds.  Good peripheral circulation. Respiratory: Normal respiratory effort.  No retractions. Lungs CTAB. Gastrointestinal: Soft and nontender. No distention. No abdominal bruits.  Musculoskeletal: No lower extremity tenderness nor edema.   Neurologic:   Patient is moving arms and legs but not very well and not very strongly.  She is not speaking.  She is awake and  apparently alert and oriented but I cannot really tell as she is not able to speak Skin:  Skin is warm, dry and intact. No rash noted.   ____________________________________________   LABS (all labs ordered are listed, but only abnormal results are displayed)  Labs Reviewed  COMPREHENSIVE METABOLIC PANEL - Abnormal; Notable for the following components:      Result Value   Glucose, Bld 135 (*)    Calcium 8.3 (*)    Total Protein  5.6 (*)    Albumin 3.2 (*)    All other components within normal limits  BRAIN NATRIURETIC PEPTIDE - Abnormal; Notable for the following components:   B Natriuretic Peptide 249.3 (*)    All other components within normal limits  CBC WITH DIFFERENTIAL/PLATELET - Abnormal; Notable for the following components:   WBC 14.9 (*)    Neutro Abs 12.3 (*)    Abs Immature Granulocytes 0.09 (*)    All other components within normal limits  LACTIC ACID, PLASMA  URINALYSIS, COMPLETE (UACMP) WITH MICROSCOPIC  TROPONIN I (HIGH SENSITIVITY)  TROPONIN I (HIGH SENSITIVITY)   ____________________________________________  EKG  EKG read interpreted by me shows normal sinus rhythm at 86 normal axis very irregular baseline makes it very difficult to read anything there do appear to be flipped T's in the chest leads diffusely ____________________________________________  RADIOLOGY Jill Poling, personally viewed and evaluated these images (plain radiographs) as part of my medical decision making, as well as reviewing the written report by the radiologist.  ED MD interpretation:  Official radiology report(s): CT Head Wo Contrast  Result Date: 07/17/2020 CLINICAL DATA:  Head injury, syncope, fall.  Hypertension. EXAM: CT HEAD WITHOUT CONTRAST CT CERVICAL SPINE WITHOUT CONTRAST TECHNIQUE: Multidetector CT imaging of the head and cervical spine was performed following the standard protocol without intravenous contrast. Multiplanar CT image reconstructions of the  cervical spine were also generated. COMPARISON:  None. FINDINGS: CT HEAD FINDINGS Brain: Normal anatomic configuration. Parenchymal volume loss is commensurate with the patient's age. Mild periventricular white matter changes are present likely reflecting the sequela of small vessel ischemia. Remote lacunar infarct is noted within the left subinsular white matter. No abnormal intra or extra-axial mass lesion or fluid collection. No abnormal mass effect or midline shift. No evidence of acute intracranial hemorrhage or infarct. Ventricular size is normal. Cerebellum unremarkable. Vascular: No asymmetric hyperdense vasculature at the skull base. Skull: Intact Sinuses/Orbits: Paranasal sinuses are clear. Orbits are unremarkable. Other: Mastoid air cells and middle ear cavities are clear. CT CERVICAL SPINE FINDINGS Alignment: Mild overall straightening. There is 2-3 mm anterolisthesis of C7 upon T1, likely degenerative in nature. Skull base and vertebrae: Mild rotary subluxation at C1-2 results in moderate central canal stenosis at the skull base with abutment, of the thecal sac with mild remodeling. Skull base is otherwise unremarkable. The atlantodental interval is normal. No acute fracture of the cervical spine. Soft tissues and spinal canal: Narrowing of the a central canal at C1-2 secondary to rotary subluxation as noted above. Posterior disc osteophyte complex at C5-6 effaces the anterior canal space and abuts the thecal sac. Spinal canal is otherwise widely patent. No canal hematoma. No paraspinal fluid collection identified. Disc levels: There is diffuse intervertebral disc space narrowing and endplate remodeling throughout the cervical spine, most severe at C4-C6 in keeping with changes of mild to moderate degenerative disc disease. Multilevel uncovertebral and facet arthrosis results in multilevel neural foraminal narrowing, most severe on the right at C4-5 and C6-7. Upper chest: Unremarkable. Other: None  IMPRESSION: No acute intracranial abnormality.  No calvarial fracture. No acute fracture of the cervical spine. Rotary subluxation at C1-2, possibly physiologic in combination with degenerative change, resulting in moderate central canal stenosis at this level with abutment and mild remodeling of the thecal sac. The degree of stenosis and resultant effect upon the spinal cord at this level may be better assessed with MRI examination. Electronically Signed   By: Helyn Numbers MD  On: 07/17/2020 22:38   CT Cervical Spine Wo Contrast  Result Date: 07/17/2020 CLINICAL DATA:  Head injury, syncope, fall.  Hypertension. EXAM: CT HEAD WITHOUT CONTRAST CT CERVICAL SPINE WITHOUT CONTRAST TECHNIQUE: Multidetector CT imaging of the head and cervical spine was performed following the standard protocol without intravenous contrast. Multiplanar CT image reconstructions of the cervical spine were also generated. COMPARISON:  None. FINDINGS: CT HEAD FINDINGS Brain: Normal anatomic configuration. Parenchymal volume loss is commensurate with the patient's age. Mild periventricular white matter changes are present likely reflecting the sequela of small vessel ischemia. Remote lacunar infarct is noted within the left subinsular white matter. No abnormal intra or extra-axial mass lesion or fluid collection. No abnormal mass effect or midline shift. No evidence of acute intracranial hemorrhage or infarct. Ventricular size is normal. Cerebellum unremarkable. Vascular: No asymmetric hyperdense vasculature at the skull base. Skull: Intact Sinuses/Orbits: Paranasal sinuses are clear. Orbits are unremarkable. Other: Mastoid air cells and middle ear cavities are clear. CT CERVICAL SPINE FINDINGS Alignment: Mild overall straightening. There is 2-3 mm anterolisthesis of C7 upon T1, likely degenerative in nature. Skull base and vertebrae: Mild rotary subluxation at C1-2 results in moderate central canal stenosis at the skull base with  abutment, of the thecal sac with mild remodeling. Skull base is otherwise unremarkable. The atlantodental interval is normal. No acute fracture of the cervical spine. Soft tissues and spinal canal: Narrowing of the a central canal at C1-2 secondary to rotary subluxation as noted above. Posterior disc osteophyte complex at C5-6 effaces the anterior canal space and abuts the thecal sac. Spinal canal is otherwise widely patent. No canal hematoma. No paraspinal fluid collection identified. Disc levels: There is diffuse intervertebral disc space narrowing and endplate remodeling throughout the cervical spine, most severe at C4-C6 in keeping with changes of mild to moderate degenerative disc disease. Multilevel uncovertebral and facet arthrosis results in multilevel neural foraminal narrowing, most severe on the right at C4-5 and C6-7. Upper chest: Unremarkable. Other: None IMPRESSION: No acute intracranial abnormality.  No calvarial fracture. No acute fracture of the cervical spine. Rotary subluxation at C1-2, possibly physiologic in combination with degenerative change, resulting in moderate central canal stenosis at this level with abutment and mild remodeling of the thecal sac. The degree of stenosis and resultant effect upon the spinal cord at this level may be better assessed with MRI examination. Electronically Signed   By: Helyn Numbers MD   On: 07/17/2020 22:38   DG Chest Portable 1 View  Result Date: 07/17/2020 CLINICAL DATA:  87 year old female with weakness. EXAM: PORTABLE CHEST 1 VIEW COMPARISON:  Chest radiograph dated 04/01/2020. FINDINGS: No focal consolidation, pleural effusion, pneumothorax. The cardiac silhouette is within limits. No acute osseous pathology. IMPRESSION: No active disease. Electronically Signed   By: Elgie Collard M.D.   On: 07/17/2020 21:41    ____________________________________________   PROCEDURES  Procedure(s) performed (including Critical  Care):  Procedures   ____________________________________________   INITIAL IMPRESSION / ASSESSMENT AND PLAN / ED COURSE  Patient with apparently new inability to speak and frequent falling with hypotension.  Hypotension is improved with fluids but the patient still is not speaking.  This is a new finding for her I am ordering an MRI of her brain to make sure there is no acute stroke since the CT did not show anything new.  CT of the neck showed some rotary subluxation of C1 on C2.  They recommended MRIs of ordering an MRI of this to.  1  where the other we will have to get her in the hospital.  I will begin this process.             ____________________________________________   FINAL CLINICAL IMPRESSION(S) / ED DIAGNOSES  Final diagnoses:  Hypotension, unspecified hypotension type  Frequent falls  Inability to speak     ED Discharge Orders    None      *Please note:  Brandy Oconnor was evaluated in Emergency Department on 07/18/2020 for the symptoms described in the history of present illness. She was evaluated in the context of the global COVID-19 pandemic, which necessitated consideration that the patient might be at risk for infection with the SARS-CoV-2 virus that causes COVID-19. Institutional protocols and algorithms that pertain to the evaluation of patients at risk for COVID-19 are in a state of rapid change based on information released by regulatory bodies including the CDC and federal and state organizations. These policies and algorithms were followed during the patient's care in the ED.  Some ED evaluations and interventions may be delayed as a result of limited staffing during and the pandemic.*   Note:  This document was prepared using Dragon voice recognition software and may include unintentional dictation errors.    Arnaldo Natal, MD 07/18/20 0001 Discussed patient in detail with Dr. Para March.  Dr. Para March will begin seeing her but we will get the MRI first  and consult neurosurgery if necessary.   Arnaldo Natal, MD 07/18/20 0012 Discussed patient with Dr. Adriana Simas neurosurgery.  He recommends placing her in a c-collar leaving her there and they will follow up in clinic later.  We will get the MRI to.  She remains neurologically intact.   Arnaldo Natal, MD 07/18/20 [phone]

## 2020-07-17 NOTE — ED Notes (Signed)
Patient returned from CT

## 2020-07-17 NOTE — ED Triage Notes (Signed)
Pt from [facility] independent living. Pt had syncopal fall today, unknown if pt hit her head. Pt sustained two previous falls within the last two days. Pt hypotensive with EMS at 70/palp. 20G IV established with 500cc NS given PTA.  CBG 176. Pt baseline dementia. Pt alert at this time.

## 2020-07-18 ENCOUNTER — Observation Stay: Payer: Medicare (Managed Care)

## 2020-07-18 ENCOUNTER — Emergency Department: Payer: Medicare (Managed Care)

## 2020-07-18 ENCOUNTER — Observation Stay
Admit: 2020-07-18 | Discharge: 2020-07-18 | Disposition: A | Discharge status: Home / Self Care | Payer: Medicare (Managed Care) | Attending: Family Medicine | Admitting: Family Medicine

## 2020-07-18 ENCOUNTER — Observation Stay: Admit: 2020-07-18 | Payer: Medicare (Managed Care)

## 2020-07-18 ENCOUNTER — Encounter: Payer: Self-pay | Admitting: Internal Medicine

## 2020-07-18 DIAGNOSIS — I959 Hypotension, unspecified: Secondary | ICD-10-CM | POA: Diagnosis not present

## 2020-07-18 DIAGNOSIS — R937 Abnormal findings on diagnostic imaging of other parts of musculoskeletal system: Secondary | ICD-10-CM

## 2020-07-18 DIAGNOSIS — W19XXXA Unspecified fall, initial encounter: Secondary | ICD-10-CM

## 2020-07-18 DIAGNOSIS — R55 Syncope and collapse: Secondary | ICD-10-CM | POA: Diagnosis not present

## 2020-07-18 DIAGNOSIS — R296 Repeated falls: Secondary | ICD-10-CM

## 2020-07-18 DIAGNOSIS — R42 Dizziness and giddiness: Secondary | ICD-10-CM

## 2020-07-18 DIAGNOSIS — R4789 Other speech disturbances: Secondary | ICD-10-CM | POA: Diagnosis not present

## 2020-07-18 DIAGNOSIS — I10 Essential (primary) hypertension: Secondary | ICD-10-CM

## 2020-07-18 DIAGNOSIS — R29818 Other symptoms and signs involving the nervous system: Secondary | ICD-10-CM

## 2020-07-18 DIAGNOSIS — I6930 Unspecified sequelae of cerebral infarction: Secondary | ICD-10-CM

## 2020-07-18 LAB — ECHOCARDIOGRAM COMPLETE
AR max vel: 2.05 cm2
AV Area VTI: 2.55 cm2
AV Area mean vel: 2.28 cm2
AV Mean grad: 2 mmHg
AV Peak grad: 3.7 mmHg
Ao pk vel: 0.96 m/s
Area-P 1/2: 4.21 cm2
Height: 60 in
S' Lateral: 2.2 cm
Weight: 1848.34 oz

## 2020-07-18 LAB — LIPID PANEL
Cholesterol: 182 mg/dL (ref 0–200)
HDL: 42 mg/dL (ref >40)
LDL Cholesterol: 112 mg/dL — ABNORMAL HIGH (ref 0–99)
Total CHOL/HDL Ratio: 4.3 RATIO
Triglycerides: 141 mg/dL (ref <150)
VLDL: 28 mg/dL (ref 0–40)

## 2020-07-18 LAB — HEMOGLOBIN A1C
Hgb A1c MFr Bld: 4.9 % (ref 4.8–5.6)
Mean Plasma Glucose: 93.93 mg/dL

## 2020-07-18 LAB — SARS CORONAVIRUS 2 (TAT 6-24 HRS): SARS Coronavirus 2: NEGATIVE

## 2020-07-18 LAB — TROPONIN I (HIGH SENSITIVITY): Troponin I (High Sensitivity): 11 ng/L (ref <18)

## 2020-07-18 MED ORDER — PRIMIDONE 50 MG PO TABS: 50.0000 mg | ORAL_TABLET | Freq: Two times a day (BID) | ORAL | Status: DC

## 2020-07-18 MED ORDER — SODIUM CHLORIDE 0.9 % IV SOLN: INTRAVENOUS | Status: DC

## 2020-07-18 MED ORDER — CARBIDOPA-LEVODOPA 10-100 MG PO TABS
1.0000 | ORAL_TABLET | Freq: Two times a day (BID) | ORAL | Status: DC
  Administered 2020-07-18: 1 via ORAL
  Filled 2020-07-18 (×2): qty 1

## 2020-07-18 MED ORDER — ACETAMINOPHEN 325 MG PO TABS: 650.0000 mg | ORAL_TABLET | ORAL | Status: DC | PRN

## 2020-07-18 MED ORDER — LEVOTHYROXINE SODIUM 112 MCG PO TABS
112.0000 ug | ORAL_TABLET | Freq: Every day | ORAL | Status: DC
  Administered 2020-07-18: 112 ug via ORAL
  Filled 2020-07-18: qty 1

## 2020-07-18 MED ORDER — ATORVASTATIN CALCIUM 20 MG PO TABS
40.0000 mg | ORAL_TABLET | Freq: Every day | ORAL | Status: DC
  Administered 2020-07-18: 40 mg via ORAL
  Filled 2020-07-18: qty 2

## 2020-07-18 MED ORDER — ATORVASTATIN CALCIUM 40 MG PO TABS: 40.0000 mg | ORAL_TABLET | Freq: Every day | ORAL | 3 refills | Status: DC

## 2020-07-18 MED ORDER — PRIMIDONE 250 MG PO TABS
250.0000 mg | ORAL_TABLET | Freq: Every day | ORAL | Status: DC
  Filled 2020-07-18: qty 1

## 2020-07-18 MED ORDER — DOCUSATE SODIUM 100 MG PO CAPS
100.0000 mg | ORAL_CAPSULE | ORAL | Status: DC
  Administered 2020-07-18: 100 mg via ORAL
  Filled 2020-07-18: qty 1

## 2020-07-18 MED ORDER — ACETAMINOPHEN 650 MG RE SUPP: 650.0000 mg | RECTAL | Status: DC | PRN

## 2020-07-18 MED ORDER — DARIFENACIN HYDROBROMIDE ER 7.5 MG PO TB24
7.5000 mg | ORAL_TABLET | Freq: Every day | ORAL | Status: DC
  Administered 2020-07-18: 7.5 mg via ORAL
  Filled 2020-07-18: qty 1

## 2020-07-18 MED ORDER — PRIMIDONE 50 MG PO TABS
100.0000 mg | ORAL_TABLET | Freq: Every day | ORAL | Status: DC
  Filled 2020-07-18: qty 2

## 2020-07-18 MED ORDER — SENNOSIDES-DOCUSATE SODIUM 8.6-50 MG PO TABS
1.0000 | ORAL_TABLET | Freq: Every evening | ORAL | Status: DC | PRN
Start: 2020-07-18 — End: 2020-07-18

## 2020-07-18 MED ORDER — HEPARIN SODIUM (PORCINE) 5000 UNIT/ML IJ SOLN
5000.0000 [IU] | Freq: Three times a day (TID) | INTRAMUSCULAR | Status: DC
  Administered 2020-07-18: 5000 [IU] via SUBCUTANEOUS
  Filled 2020-07-18: qty 1

## 2020-07-18 MED ORDER — ASPIRIN 325 MG PO TABS
325.0000 mg | ORAL_TABLET | Freq: Every day | ORAL | Status: DC
  Administered 2020-07-18: 325 mg via ORAL
  Filled 2020-07-18: qty 1

## 2020-07-18 MED ORDER — STROKE: EARLY STAGES OF RECOVERY BOOK: Freq: Once | Status: DC

## 2020-07-18 MED ORDER — ASPIRIN EC 81 MG PO TBEC: 81.0000 mg | DELAYED_RELEASE_TABLET | Freq: Every day | ORAL | 2 refills | Status: DC

## 2020-07-18 MED ORDER — PRIMIDONE 50 MG PO TABS
50.0000 mg | ORAL_TABLET | Freq: Every day | ORAL | Status: DC
  Administered 2020-07-18: 50 mg via ORAL
  Filled 2020-07-18: qty 1

## 2020-07-18 MED ORDER — ACETAMINOPHEN 160 MG/5ML PO SOLN
650.0000 mg | ORAL | Status: DC | PRN
  Filled 2020-07-18: qty 20.3

## 2020-07-18 MED ORDER — CLOPIDOGREL BISULFATE 75 MG PO TABS: 75.0000 mg | ORAL_TABLET | Freq: Every day | ORAL | 0 refills | Status: DC

## 2020-07-18 NOTE — Evaluation (Signed)
Speech Language Pathology Evaluation Patient Details Name: Brandy Oconnor MRN: [id number] DOB: [birth year] Today's Date: 07/18/2020 Time: 1012-1028 SLP Time Calculation (min) (ACUTE ONLY): 16 min  Problem List:  Patient Active Problem List   Diagnosis Date Noted  . Falls 07/18/2020  . Acute focal neurological deficit 07/18/2020  . Abnormal CT scan, cervical spine 07/18/2020  . Postural dizziness with presyncope 07/18/2020  . Diverticulitis 12/17/2018  . Encephalopathy acute 01/04/2016  . Hip fracture (HCC) 05/23/2015  . Benign essential hypertension 09/28/2013  . Gout 09/28/2013  . Hypothyroidism 09/28/2013  . Other and unspecified hyperlipidemia 09/28/2013   Past Medical History:  Past Medical History:  Diagnosis Date  . Dementia (HCC)   . Depression   . Fatigue   . Femur fracture, right (HCC)   . GERD (gastroesophageal reflux disease)   . Hyperlipidemia   . Hypertension   . Hypothyroidism   . Osteoarthritis   . Tremor    benign essential tremor- tremors of voice, head and jaw  . Weakness    Past Surgical History:  Past Surgical History:  Procedure Laterality Date  . ABDOMINAL HYSTERECTOMY    . APPENDECTOMY    . CATARACT EXTRACTION W/PHACO Right 11/02/2017   Procedure: CATARACT EXTRACTION PHACO AND INTRAOCULAR LENS PLACEMENT (IOC);  Surgeon: Galen Manila, MD;  Location: [facility] ORS;  Service: Ophthalmology;  Laterality: Right;  [place] 00:37 AP% 15.0 CDE 5.58 Fluid pack lot # [id number]  . CATARACT EXTRACTION W/PHACO Left 12/07/2017   Procedure: CATARACT EXTRACTION PHACO AND INTRAOCULAR LENS PLACEMENT (IOC);  Surgeon: Galen Manila, MD;  Location: [facility] ORS;  Service: Ophthalmology;  Laterality: Left;  [place] 00:34 AP% 14.2 CDE 4.88 Fluid pack lot # [id number]  . CHOLECYSTECTOMY    . THYROIDECTOMY, PARTIAL    . TOTAL HIP ARTHROPLASTY Right 05/24/2015   Procedure: TOTAL HIP ARTHROPLASTY ANTERIOR APPROACH;  Surgeon: Kennedy Bucker, MD;  Location: [facility] ORS;  Service: Orthopedics;   Laterality: Right;   HPI:  Brandy Oconnor is a 87 y.o. female with medical history significant for Dementia, hypothyroidism, hypertension, tremors, who presents to the emergency room following a fall. No acute intracranial abnormality with Age-related cerebral atrophy with mild chronic small vessel ischemic disease.   Assessment / Plan / Recommendation Clinical Impression  Pt's baseline speech-language abilities are unknown with conflicting information reported in chart. During this evaluation, pt was attentive to therapist, appears HOH, is oriented to self only and expressive language is intermittently garbled. She is unable to state her age or birthday. This Clinical research associate left a voicemail with pt's daughter to further assess if the above mentioned abilities are considered baseline.    SLP Assessment  SLP Recommendation/Assessment: Patient does not need any further Speech Lanaguage Pathology Services SLP Visit Diagnosis: Cognitive communication deficit (R41.841)             SLP Evaluation Cognition  Overall Cognitive Status: No family/caregiver present to determine baseline cognitive functioning Arousal/Alertness: Awake/alert Orientation Level: Oriented to person       Comprehension  Auditory Comprehension Overall Auditory Comprehension: Appears within functional limits for tasks assessed    Expression Expression Primary Mode of Expression: Verbal Verbal Expression Overall Verbal Expression: Impaired Written Expression Dominant Hand: Left   Oral / Motor  Motor Speech Overall Motor Speech: Appears within functional limits for tasks assessed Intelligibility: Intelligible   GO                   Ravonda Brecheen B. Dreama Saa M.S., CCC-SLP, [facility] Speech-Language Pathologist Rehabilitation Services Office [phone]  Mallary Kreger 07/18/2020, 4:13 PM

## 2020-07-18 NOTE — ED Notes (Signed)
Pt unable to sign due to mental status. Report given to [facility].

## 2020-07-18 NOTE — Discharge Summary (Signed)
Physician Discharge Summary  Brandy Oconnor ZOX:[id number] DOB: 1931/03/21 DOA: 07/17/2020  PCP: Delton Prairie, FNP  Admit date: 07/17/2020 Discharge date: 07/18/2020  Admitted From: Home  Disposition:  [facility] program  Recommendations for Outpatient Follow-up:  1. Follow up with Neurosurgery Dr. Adriana Simas as soon as able 2. Please follow up on the following pending results: echocardiogram  Home Health: Per [facility] program  Equipment/Devices: Per [facility] program  Discharge Condition: Frail  CODE STATUS: FULL Diet recommendation: regular  Brief/Interim Summary: Brandy Oconnor  is a 87 y.o. F with dementia, lives in [place] [facility] in [place], Parkinson's disease, HTN, hypothyroidism, and diverticulitis who presented with two days recurrent fall and weakness.  EMS found BP 70/palp, normalized without intervention in the ER.  CTH unremarkable, CT c-spine showed possible rotary subluxation.  Case discussed with Neurosurgery who recommended c-collar, and outpatient MRI and follow up.           PRINCIPAL HOSPITAL DIAGNOSIS: Vasovagal syncope    Discharge Diagnoses:    Falls Likely vasovagal syncope Recently dealing with constipation, has had two episodes (including the one yesterday) of syncope now.  May also be related to her beta-blockade, from report from [facility], it seems she was just transitioned to propranolol from Atenolol.    Aphasia, transient There was some initial concern for aphasia.  In the ER, neuraxial imaging negative.  Carotid imaging was done.  Echocardiogram was done.  Ultimately, these two former imaging tests were unremarkable and it was felt she did NOT have a TIA.  All her symptoms were blood pressure related.      Possible subluxation CT neck initially showed a possible C1-2 subluxation.  This was not appreciated on MRI C spine. Both images were discussed with Neurosurgery, Dr. Teola Bradley and Dr. Adriana Simas, who recommended outpatient follow up and hard collar until  then.   Dementia  Parkinson's disease  Hypertension  Hypothyroidism            Discharge Instructions  Discharge Instructions    Diet - low sodium heart healthy   Complete by: As directed    Increase activity slowly   Complete by: As directed      Allergies as of 07/18/2020      Reactions   Codeine Rash      Medication List    TAKE these medications   acetaminophen 500 MG tablet Commonly known as: TYLENOL Take 500-1,000 mg by mouth every 6 (six) hours as needed.   atenolol 50 MG tablet Commonly known as: TENORMIN Take 50 mg by mouth daily.   carbidopa-levodopa 10-100 MG tablet Commonly known as: SINEMET IR Take 1 tablet by mouth 2 (two) times daily.   cholecalciferol 25 MCG (1000 UNIT) tablet Commonly known as: VITAMIN D3 Take 1,000 Units by mouth daily.   Colace 100 MG capsule Generic drug: docusate sodium Take 100 mg by mouth every other day.   ENSURE PLUS PO Take 237 mLs by mouth 2 (two) times a day.   ketotifen 0.025 % ophthalmic solution Commonly known as: ZADITOR Place 1 drop into both eyes daily.   levothyroxine 112 MCG tablet Commonly known as: SYNTHROID Take 112 mcg by mouth daily.   lidocaine 5 % Commonly known as: LIDODERM Place 1 patch onto the skin daily. Remove & Discard patch within 12 hours or as directed by MD   lisinopril 5 MG tablet Commonly known as: ZESTRIL Take 7.5 mg by mouth daily.   loratadine 10 MG tablet Commonly known as: CLARITIN Take 10  mg by mouth daily.   primidone 250 MG tablet Commonly known as: MYSOLINE Take 250 mg by mouth at bedtime.   primidone 50 MG tablet Commonly known as: MYSOLINE Take 50-100 mg by mouth 2 (two) times a day. Give 100 mg in the morning, and 50 mg at noon. Give noon dose at [facility] on center days   propranolol ER 60 MG 24 hr capsule Commonly known as: INDERAL LA Take 60 mg by mouth daily. For tremors and blood pressure   trospium 20 MG tablet Commonly known as:  SANCTURA Take 20 mg by mouth daily.       Follow-up Information    Lucy Chris, MD. Schedule an appointment as soon as possible for a visit in 2 day(s).   Specialty: Neurosurgery Contact information: [address] [place] [address] [phone]              Allergies  Allergen Reactions  . Codeine Rash    Consultations:  Neurology   Procedures/Studies: DG Abd 1 View  Result Date: 07/18/2020 CLINICAL DATA:  MRI clearance EXAM: ABDOMEN - 1 VIEW COMPARISON:  None. FINDINGS: Cholecystectomy clips noted within the right upper quadrant. Normal abdominal gas pattern. Right total hip arthroplasty has been performed. Osseous structures are age-appropriate. IMPRESSION: No unexpected metallic foreign body within the visualized abdomen. Electronically Signed   By: Helyn Numbers MD   On: 07/18/2020 01:00   CT Head Wo Contrast  Result Date: 07/17/2020 CLINICAL DATA:  Head injury, syncope, fall.  Hypertension. EXAM: CT HEAD WITHOUT CONTRAST CT CERVICAL SPINE WITHOUT CONTRAST TECHNIQUE: Multidetector CT imaging of the head and cervical spine was performed following the standard protocol without intravenous contrast. Multiplanar CT image reconstructions of the cervical spine were also generated. COMPARISON:  None. FINDINGS: CT HEAD FINDINGS Brain: Normal anatomic configuration. Parenchymal volume loss is commensurate with the patient's age. Mild periventricular white matter changes are present likely reflecting the sequela of small vessel ischemia. Remote lacunar infarct is noted within the left subinsular white matter. No abnormal intra or extra-axial mass lesion or fluid collection. No abnormal mass effect or midline shift. No evidence of acute intracranial hemorrhage or infarct. Ventricular size is normal. Cerebellum unremarkable. Vascular: No asymmetric hyperdense vasculature at the skull base. Skull: Intact Sinuses/Orbits: Paranasal sinuses are clear. Orbits are unremarkable. Other:  Mastoid air cells and middle ear cavities are clear. CT CERVICAL SPINE FINDINGS Alignment: Mild overall straightening. There is 2-3 mm anterolisthesis of C7 upon T1, likely degenerative in nature. Skull base and vertebrae: Mild rotary subluxation at C1-2 results in moderate central canal stenosis at the skull base with abutment, of the thecal sac with mild remodeling. Skull base is otherwise unremarkable. The atlantodental interval is normal. No acute fracture of the cervical spine. Soft tissues and spinal canal: Narrowing of the a central canal at C1-2 secondary to rotary subluxation as noted above. Posterior disc osteophyte complex at C5-6 effaces the anterior canal space and abuts the thecal sac. Spinal canal is otherwise widely patent. No canal hematoma. No paraspinal fluid collection identified. Disc levels: There is diffuse intervertebral disc space narrowing and endplate remodeling throughout the cervical spine, most severe at C4-C6 in keeping with changes of mild to moderate degenerative disc disease. Multilevel uncovertebral and facet arthrosis results in multilevel neural foraminal narrowing, most severe on the right at C4-5 and C6-7. Upper chest: Unremarkable. Other: None IMPRESSION: No acute intracranial abnormality.  No calvarial fracture. No acute fracture of the cervical spine. Rotary subluxation at C1-2,  possibly physiologic in combination with degenerative change, resulting in moderate central canal stenosis at this level with abutment and mild remodeling of the thecal sac. The degree of stenosis and resultant effect upon the spinal cord at this level may be better assessed with MRI examination. Electronically Signed   By: Helyn Numbers MD   On: 07/17/2020 22:38   CT Cervical Spine Wo Contrast  Result Date: 07/17/2020 CLINICAL DATA:  Head injury, syncope, fall.  Hypertension. EXAM: CT HEAD WITHOUT CONTRAST CT CERVICAL SPINE WITHOUT CONTRAST TECHNIQUE: Multidetector CT imaging of the head and  cervical spine was performed following the standard protocol without intravenous contrast. Multiplanar CT image reconstructions of the cervical spine were also generated. COMPARISON:  None. FINDINGS: CT HEAD FINDINGS Brain: Normal anatomic configuration. Parenchymal volume loss is commensurate with the patient's age. Mild periventricular white matter changes are present likely reflecting the sequela of small vessel ischemia. Remote lacunar infarct is noted within the left subinsular white matter. No abnormal intra or extra-axial mass lesion or fluid collection. No abnormal mass effect or midline shift. No evidence of acute intracranial hemorrhage or infarct. Ventricular size is normal. Cerebellum unremarkable. Vascular: No asymmetric hyperdense vasculature at the skull base. Skull: Intact Sinuses/Orbits: Paranasal sinuses are clear. Orbits are unremarkable. Other: Mastoid air cells and middle ear cavities are clear. CT CERVICAL SPINE FINDINGS Alignment: Mild overall straightening. There is 2-3 mm anterolisthesis of C7 upon T1, likely degenerative in nature. Skull base and vertebrae: Mild rotary subluxation at C1-2 results in moderate central canal stenosis at the skull base with abutment, of the thecal sac with mild remodeling. Skull base is otherwise unremarkable. The atlantodental interval is normal. No acute fracture of the cervical spine. Soft tissues and spinal canal: Narrowing of the a central canal at C1-2 secondary to rotary subluxation as noted above. Posterior disc osteophyte complex at C5-6 effaces the anterior canal space and abuts the thecal sac. Spinal canal is otherwise widely patent. No canal hematoma. No paraspinal fluid collection identified. Disc levels: There is diffuse intervertebral disc space narrowing and endplate remodeling throughout the cervical spine, most severe at C4-C6 in keeping with changes of mild to moderate degenerative disc disease. Multilevel uncovertebral and facet arthrosis  results in multilevel neural foraminal narrowing, most severe on the right at C4-5 and C6-7. Upper chest: Unremarkable. Other: None IMPRESSION: No acute intracranial abnormality.  No calvarial fracture. No acute fracture of the cervical spine. Rotary subluxation at C1-2, possibly physiologic in combination with degenerative change, resulting in moderate central canal stenosis at this level with abutment and mild remodeling of the thecal sac. The degree of stenosis and resultant effect upon the spinal cord at this level may be better assessed with MRI examination. Electronically Signed   By: Helyn Numbers MD   On: 07/17/2020 22:38   MR BRAIN WO CONTRAST  Result Date: 07/18/2020 CLINICAL DATA:  Initial evaluation for acute altered mental status. EXAM: MRI HEAD WITHOUT CONTRAST TECHNIQUE: Multiplanar, multiecho pulse sequences of the brain and surrounding structures were obtained without intravenous contrast. COMPARISON:  Prior CT from 07/17/2020 FINDINGS: Brain: Examination degraded by motion artifact. Generalized age-related cerebral atrophy. Patchy and confluent T2/FLAIR hyperintensity within the periventricular deep white matter both cerebral hemispheres most consistent with chronic small vessel ischemic disease, mild for age. No abnormal foci of restricted diffusion to suggest acute or subacute ischemia. Gray-white matter differentiation maintained. No encephalomalacia to suggest chronic cortical infarction. No acute intracranial hemorrhage. Single chronic microhemorrhage noted at the posterior right temporal occipital  region, of doubtful significance in isolation. No mass lesion, midline shift or mass effect. No hydrocephalus or extra-axial fluid collection. Pituitary gland suprasellar region normal. Midline structures intact. Vascular: Major intracranial vascular flow voids are maintained. Skull and upper cervical spine: Craniocervical junction within normal limits. Bone marrow signal intensity normal.  Hyperostosis frontalis interna noted. No scalp soft tissue abnormality. Sinuses/Orbits: Patient status post bilateral ocular lens replacement. Globes and orbital soft tissues demonstrate no acute finding. Paranasal sinuses are largely clear. Small right mastoid effusion noted, of doubtful significance. Inner ear structures grossly normal. Other: None. IMPRESSION: 1. No acute intracranial abnormality. 2. Age-related cerebral atrophy with mild chronic small vessel ischemic disease. Electronically Signed   By: Jeannine Boga M.D.   On: 07/18/2020 02:11   MR Cervical Spine Wo Contrast  Result Date: 07/18/2020 CLINICAL DATA:  Initial evaluation spinal stenosis, abnormal CT. EXAM: MRI CERVICAL SPINE WITHOUT CONTRAST TECHNIQUE: Multiplanar, multisequence MR imaging of the cervical spine was performed. No intravenous contrast was administered. COMPARISON:  Prior CT from 07/17/2020. FINDINGS: Alignment: Examination degraded by motion artifact. Levoscoliosis with straightening of the normal cervical lordosis. Trace retrolisthesis of C3 on C4, with trace anterolisthesis of C7 on T1. Findings chronic and facet mediated. Vertebrae: Mild degenerative height loss present at the C5 vertebral body. Vertebral body height otherwise maintained without acute or chronic fracture. Bone marrow signal intensity mildly heterogeneous but within normal limits. No discrete or worrisome osseous lesions. No abnormal marrow edema. No evidence for acute osseous abnormality within the cervical spine by MRI. Cord: Signal intensity within the cervical spinal cord is grossly within normal limits on this motion degraded exam. Posterior Fossa, vertebral arteries, paraspinal tissues: Visualized brain and posterior fossa within normal limits. Craniocervical junction normal. Paraspinous and prevertebral soft tissues within normal limits. Normal flow voids seen within the vertebral arteries bilaterally. Disc levels: C1-2: Prominent uncovertebral  spurring present at the right greater than left C1-2 articulations. Thecal sac remains patent without significant spinal stenosis or evidence for cord compression. Moderate right worse than left foraminal narrowing. C2-C3: Mild uncovertebral hypertrophy without disc bulge. Moderate right with mild left facet hypertrophy. No spinal stenosis. Mild right C3 foraminal narrowing. Left neural foramina remains patent. C3-C4: Trace retrolisthesis. Degenerative intervertebral disc space narrowing with right eccentric disc osteophyte complex. Broad posterior component flattens and partially effaces the ventral thecal sac with resultant mild right-sided spinal stenosis. Mild flattening of the ventral cord without cord signal changes. Superimposed moderate right with mild left facet degeneration. Resultant moderate right with mild left C4 foraminal stenosis. C4-C5: Degenerative intervertebral disc space narrowing with diffuse disc osteophyte. Broad posterior component flattens and partially faces the ventral thecal sac. Mild spinal stenosis with minimal flattening of the ventral cord. Right worse than left facet hypertrophy. Resultant moderate bilateral C5 foraminal stenosis, also worse on the right. C5-C6: Degenerative intervertebral disc space narrowing with diffuse disc osteophyte complex. Broad posterior component indents and partially faces the ventral thecal sac. Resultant mild-to-moderate spinal stenosis with mild cord flattening. Right-sided facet hypertrophy. Resultant moderate right worse than left C6 foraminal narrowing. C6-C7: Degenerative intervertebral disc space narrowing with diffuse disc osteophyte. Mild flattening and partial effacement of the ventral thecal sac with resultant mild spinal stenosis. No cord impingement. Moderate right worse than left C7 foraminal stenosis. C7-T1: Anterolisthesis. Minimal disc bulge. Moderate right with mild left facet hypertrophy. No spinal stenosis. Mild right foraminal  narrowing. Left neural foramen remains patent. Visualized upper thoracic spine demonstrates no significant finding. IMPRESSION: 1. No acute abnormality  within the cervical spine by MRI. 2. Multilevel cervical spondylosis with resultant mild to moderate diffuse spinal stenosis at C3-4 through C6-7, most pronounced at C5-6. 3. Multifactorial degenerative changes with resultant multilevel foraminal narrowing as above. Notable findings include moderate right C4 and bilateral C5 through C7 foraminal stenosis. Electronically Signed   By: Rise Mu M.D.   On: 07/18/2020 02:25   US Carotid Bilateral (at [facility] and [facility] only)  Result Date: 07/18/2020 CLINICAL DATA:  Stroke EXAM: BILATERAL CAROTID DUPLEX ULTRASOUND TECHNIQUE: Wallace Cullens scale imaging, color Doppler and duplex ultrasound were performed of bilateral carotid and vertebral arteries in the neck. COMPARISON:  None. FINDINGS: Criteria: Quantification of carotid stenosis is based on velocity parameters that correlate the residual internal carotid diameter with NASCET-based stenosis levels, using the diameter of the distal internal carotid lumen as the denominator for stenosis measurement. The following velocity measurements were obtained: RIGHT ICA: 82/24 cm/sec CCA: 61/12 cm/sec SYSTOLIC ICA/CCA RATIO:  1.3 ECA: 60 cm/sec LEFT ICA: 63/18 cm/sec CCA: 58/10 cm/sec SYSTOLIC ICA/CCA RATIO:  1.1 ECA: 79 cm/sec RIGHT CAROTID ARTERY: Minimal atherosclerotic plaque RIGHT VERTEBRAL ARTERY:  Antegrade flow LEFT CAROTID ARTERY:  No significant atherosclerotic plaque LEFT VERTEBRAL ARTERY:  Antegrade flow Upper extremity blood pressures: RIGHT: Not measured LEFT: Not measured IMPRESSION: No evidence of hemodynamically significant stenosis involving the internal carotid arteries bilaterally. Electronically Signed   By: Helyn Numbers MD   On: 07/18/2020 03:29   DG Chest Portable 1 View  Result Date: 07/17/2020 CLINICAL DATA:  87 year old female with weakness. EXAM:  PORTABLE CHEST 1 VIEW COMPARISON:  Chest radiograph dated 04/01/2020. FINDINGS: No focal consolidation, pleural effusion, pneumothorax. The cardiac silhouette is within limits. No acute osseous pathology. IMPRESSION: No active disease. Electronically Signed   By: Elgie Collard M.D.   On: 07/17/2020 21:41      Subjective: Feeling at baseline.  No focal weakness, numbness.  No fever.    Discharge Exam: Vitals:   07/18/20 0654 07/18/20 1130  BP: (!) 142/76 (!) 142/67  Pulse: 91 94  Resp: 15 15  Temp:    SpO2: 99% 97%   Vitals:   07/17/20 2130 07/17/20 2241 07/18/20 0654 07/18/20 1130  BP: 139/82 121/67 (!) 142/76 (!) 142/67  Pulse: 84 82 91 94  Resp: 16 18 15 15   Temp:      TempSrc:      SpO2: 99% 97% 99% 97%  Weight:      Height:        General: Pt is alert, awake, not in acute distress, lying in bed, uncomfortable in hard collar Cardiovascular: RRR, nl S1-S2, no murmurs appreciated.   No LE edema.   Respiratory: Normal respiratory rate and rhythm.  CTAB without rales or wheezes. Abdominal: Abdomen soft and non-tender.  No distension or HSM.   Neuro/Psych: Strength symmetric in upper and lower extremities but very weak, bad intention tremor noted.  Judgment and insight appear normal.   The results of significant diagnostics from this hospitalization (including imaging, microbiology, ancillary and laboratory) are listed below for reference.     Microbiology: No results found for this or any previous visit (from the past 240 hour(s)).   Labs: BNP (last 3 results) Recent Labs    03/31/20 2308 07/17/20 2109  BNP 145.4* 249.3*   Basic Metabolic Panel: Recent Labs  Lab 07/17/20 2109  NA 142  K 4.4  CL 107  CO2 28  GLUCOSE 135*  BUN 21  CREATININE 0.83  CALCIUM 8.3*  Liver Function Tests: Recent Labs  Lab 07/17/20 2109  AST 17  ALT 9  ALKPHOS 69  BILITOT 0.8  PROT 5.6*  ALBUMIN 3.2*   No results for input(s): LIPASE, AMYLASE in the last 168  hours. No results for input(s): AMMONIA in the last 168 hours. CBC: Recent Labs  Lab 07/17/20 2109  WBC 14.9*  NEUTROABS 12.3*  HGB 14.9  HCT 43.5  MCV 88.6  PLT 184   Cardiac Enzymes: No results for input(s): CKTOTAL, CKMB, CKMBINDEX, TROPONINI in the last 168 hours. BNP: Invalid input(s): POCBNP CBG: No results for input(s): GLUCAP in the last 168 hours. D-Dimer No results for input(s): DDIMER in the last 72 hours. Hgb A1c Recent Labs    07/18/20 0640  HGBA1C 4.9   Lipid Profile Recent Labs    07/18/20 0640  CHOL 182  HDL 42  LDLCALC 112*  TRIG 141  CHOLHDL 4.3   Thyroid function studies No results for input(s): TSH, T4TOTAL, T3FREE, THYROIDAB in the last 72 hours.  Invalid input(s): FREET3 Anemia work up No results for input(s): VITAMINB12, FOLATE, FERRITIN, TIBC, IRON, RETICCTPCT in the last 72 hours. Urinalysis    Component Value Date/Time   COLORURINE YELLOW (A) 04/01/2020 0545   APPEARANCEUR CLEAR (A) 04/01/2020 0545   APPEARANCEUR Clear 11/08/2014 0059   LABSPEC 1.016 04/01/2020 0545   LABSPEC 1.009 11/08/2014 0059   PHURINE 6.0 04/01/2020 0545   GLUCOSEU NEGATIVE 04/01/2020 0545   GLUCOSEU Negative 11/08/2014 0059   HGBUR SMALL (A) 04/01/2020 0545   BILIRUBINUR NEGATIVE 04/01/2020 0545   BILIRUBINUR Negative 11/08/2014 0059   KETONESUR NEGATIVE 04/01/2020 0545   PROTEINUR NEGATIVE 04/01/2020 0545   NITRITE NEGATIVE 04/01/2020 0545   LEUKOCYTESUR NEGATIVE 04/01/2020 0545   LEUKOCYTESUR Negative 11/08/2014 0059   Sepsis Labs Invalid input(s): PROCALCITONIN,  WBC,  LACTICIDVEN Microbiology No results found for this or any previous visit (from the past 240 hour(s)).   Time coordinating discharge: 90 minutes      SIGNED:   Alberteen Samhristopher P Aldea Avis, MD  [facility] 07/18/2020, 2:23 PM

## 2020-07-18 NOTE — Evaluation (Signed)
Occupational Therapy Evaluation Patient Details Name: Brandy Oconnor MRN: [id number] DOB: 1931/04/13 Today's Date: 07/18/2020    History of Present Illness 87 y.o. female who comes from [facility] independent living.  Patient apparently fell and/or passed out today.  She is done that twice in the last 2 days.  Pt with baseline dementia and is pleasantly confused t/o session.   Clinical Impression   Patient presenting with decreased I in self care, balance, functional mobility/transfer, endurance, and safety awareness. Pt report is poor historian. OT attempting to call daughter for PLOF information but unable to reach her from numbers listed in system. Patient reports living with daughter at baseline with assistance PTA. Patient currently functioning at min A. Pt very pleasant and cooperative but remains with increased confused.  Patient will benefit from acute OT to increase overall independence in the areas of ADLs, functional mobility, safety awareness in order to safely discharge home with caregiver.    Follow Up Recommendations  Home health OT;Other (comment);Supervision/Assistance - 24 hour (PACE program)    Equipment Recommendations  None recommended by OT       Precautions / Restrictions Precautions Precautions: Fall;Cervical Precaution Comments: C-collar Restrictions Weight Bearing Restrictions: No      Mobility Bed Mobility Overal bed mobility: Needs Assistance Bed Mobility: Supine to Sit;Sit to Supine     Supine to sit: Min assist Sit to supine: Min assist   General bed mobility comments: with min cuing for technique and hand placement    Transfers Overall transfer level: Needs assistance Equipment used: Rolling walker (2 wheeled) Transfers: Sit to/from Stand Sit to Stand: Min assist         General transfer comment: tall bed with short pt, she needed direct assist to keep weight over toes during transition, once position stabilized she needed only min assist  to attain standing.  In return to sitting she did well trying to get hips up onto bed, but ultimately she needed PT to help considerably to get fully back up into the bed (a function of her height, dementia and weakness)    Balance Overall balance assessment: Needs assistance Sitting-balance support: Bilateral upper extremity supported Sitting balance-Leahy Scale: Fair     Standing balance support: Bilateral upper extremity supported Standing balance-Leahy Scale: Fair                             ADL either performed or assessed with clinical judgement   ADL Overall ADL's : Needs assistance/impaired     Grooming: Wash/dry hands;Wash/dry face;Sitting;Set up;Supervision/safety                                       Vision Patient Visual Report: No change from baseline              Pertinent Vitals/Pain Pain Assessment: No/denies pain     Hand Dominance Left   Extremity/Trunk Assessment Upper Extremity Assessment Upper Extremity Assessment: Generalized weakness   Lower Extremity Assessment Lower Extremity Assessment: Generalized weakness       Communication Communication Communication: No difficulties   Cognition Arousal/Alertness: Awake/alert Behavior During Therapy: WFL for tasks assessed/performed Overall Cognitive Status: History of cognitive impairments - at baseline  General Comments: Pt very pleasant and cooperative but oriented to self only. Unable to verbalize DOB or location.   General Comments  supine BP: 152/74, standing 123/79            Home Living Family/patient expects to be discharged to:: Unsure                                 Additional Comments: Pt is poor historian. Chart reports pt livs at independent living but pt reports living with daughter. OT attempt to call daughter but cell not on with no VM set up and other number out of service.      Prior  Functioning/Environment Level of Independence: Needs assistance  Gait / Transfers Assistance Needed: Pt uses walker in the home ADL's / Homemaking Assistance Needed: lives with daughter who helps with most ADLs   Comments: PLOF per chart and limited pt report (2/2 dementia)        OT Problem List: Decreased strength;Decreased knowledge of use of DME or AE;Decreased knowledge of precautions;Decreased activity tolerance;Decreased cognition;Impaired balance (sitting and/or standing);Decreased safety awareness      OT Treatment/Interventions: Self-care/ADL training;Therapeutic exercise;Patient/family education;Balance training;Energy conservation;Therapeutic activities;Cognitive remediation/compensation;DME and/or AE instruction    OT Goals(Current goals can be found in the care plan section) Acute Rehab OT Goals Patient Stated Goal: pt wants to be able to go back home OT Goal Formulation: With patient Time For Goal Achievement: 08/01/20 Potential to Achieve Goals: Good ADL Goals Pt Will Perform Grooming: standing;with supervision Pt Will Transfer to Toilet: with supervision;ambulating Pt Will Perform Toileting - Clothing Manipulation and hygiene: with supervision;sit to/from stand Pt Will Perform Tub/Shower Transfer: with supervision;ambulating  OT Frequency: Min 1X/week   Barriers to D/C:    none known at this time          AM-PAC OT "6 Clicks" Daily Activity     Outcome Measure Help from another person eating meals?: A Little Help from another person taking care of personal grooming?: A Little Help from another person toileting, which includes using toliet, bedpan, or urinal?: A Lot Help from another person bathing (including washing, rinsing, drying)?: A Lot Help from another person to put on and taking off regular upper body clothing?: A Little Help from another person to put on and taking off regular lower body clothing?: A Lot 6 Click Score: 15   End of Session     Activity Tolerance: Patient tolerated treatment well Patient left: in bed;with call bell/phone within reach  OT Visit Diagnosis: Unsteadiness on feet (R26.81);Repeated falls (R29.6);Muscle weakness (generalized) (M62.81)                Time: 0263-7858 OT Time Calculation (min): 16 min Charges:  OT General Charges $OT Visit: 1 Visit OT Evaluation $OT Eval Low Complexity: 1 Low  Jackquline Denmark, MS, OTR/L , CBIS ascom [phone]  07/18/20, 1:01 PM

## 2020-07-18 NOTE — H&P (Signed)
History and Physical    Brandy Oconnor TIR:[id number] DOB: Nov 15, 1930 DOA: 07/17/2020  PCP: Delton Prairie, FNP   Patient coming from: Home  I have personally briefly reviewed patient's old medical records in [facility]  Chief Complaint: Fall, syncope  HPI: Brandy Oconnor is a 87 y.o. female with medical history significant for Dementia, hypothyroidism, hypertension, tremors, who presents to the emergency room following a fall.  History is taken mostly from ER provider and residents due to history of dementia.  Patient denies pain.  Has trouble explaining the circumstances of the fall.  Son reports that she may have had a syncopal episode.  On arrival of EMS she was reportedly hypotensive with systolic of 70 ED course: Upon arrival, according to the ED provider, patient would not answer questions and seemed to be aphasic.  Blood pressure 142/86, afebrile, pulse 86 O2 sat 100% on room air.  Blood work significant for WBC of 15,000 with normal lactic acid of 1.8.  Troponin of 12, BNP 250.  Urinalysis unremarkable. EKG as interpreted by me: Sinus rhythm at 86. Imaging: Chest x-ray clear Head CT with no acute intracranial abnormality C-spine: Rotary subluxation at C1-2 possibly physiologic in combination with degenerative change with recommendation for MRI  The emergency room provider contacted neurosurgeon Dr. Adriana Simas regarding abnormal finding on C-spine and he recommended a c-collar and outpatient follow-up but agreed with getting the MRI as recommended by the radiologist, with consult indicated if any abnormality. Hospitalist consulted for admission.  Review of Systems: Limited due to dementia   Past Medical History:  Diagnosis Date  . Dementia (HCC)   . Depression   . Fatigue   . Femur fracture, right (HCC)   . GERD (gastroesophageal reflux disease)   . Hyperlipidemia   . Hypertension   . Hypothyroidism   . Osteoarthritis   . Tremor    benign essential tremor- tremors of voice,  head and jaw  . Weakness     Past Surgical History:  Procedure Laterality Date  . ABDOMINAL HYSTERECTOMY    . APPENDECTOMY    . CATARACT EXTRACTION W/PHACO Right 11/02/2017   Procedure: CATARACT EXTRACTION PHACO AND INTRAOCULAR LENS PLACEMENT (IOC);  Surgeon: Galen Manila, MD;  Location: [facility] ORS;  Service: Ophthalmology;  Laterality: Right;  [place] 00:37 AP% 15.0 CDE 5.58 Fluid pack lot # [id number]  . CATARACT EXTRACTION W/PHACO Left 12/07/2017   Procedure: CATARACT EXTRACTION PHACO AND INTRAOCULAR LENS PLACEMENT (IOC);  Surgeon: Galen Manila, MD;  Location: [facility] ORS;  Service: Ophthalmology;  Laterality: Left;  [place] 00:34 AP% 14.2 CDE 4.88 Fluid pack lot # [id number]  . CHOLECYSTECTOMY    . THYROIDECTOMY, PARTIAL    . TOTAL HIP ARTHROPLASTY Right 05/24/2015   Procedure: TOTAL HIP ARTHROPLASTY ANTERIOR APPROACH;  Surgeon: Kennedy Bucker, MD;  Location: [facility] ORS;  Service: Orthopedics;  Laterality: Right;     reports that she has never smoked. Her smokeless tobacco use includes snuff. She reports that she does not drink alcohol and does not use drugs.  Allergies  Allergen Reactions  . Codeine Rash    Family History  Problem Relation Age of Onset  . Diabetes Mellitus II Mother   . Other Father        Tremors      Prior to Admission medications   Medication Sig Start Date End Date Taking? Authorizing Provider  acetaminophen (TYLENOL) 500 MG tablet Take 500-1,000 mg by mouth every 6 (six) hours as needed.   Yes [provider]  atenolol (TENORMIN) 50 MG tablet Take 50 mg by mouth daily.   Yes [provider]  carbidopa-levodopa (SINEMET IR) 10-100 MG tablet Take 1 tablet by mouth 2 (two) times daily.   Yes [provider]  cholecalciferol (VITAMIN D3) 25 MCG (1000 UT) tablet Take 1,000 Units by mouth daily.   Yes [provider]  COLACE 100 MG capsule Take 100 mg by mouth every other day. 04/01/20  Yes [provider]  ketotifen  (ZADITOR) 0.025 % ophthalmic solution Place 1 drop into both eyes daily.   Yes [provider]  levothyroxine (SYNTHROID) 112 MCG tablet Take 112 mcg by mouth daily. 03/07/20  Yes [provider]  lidocaine (LIDODERM) 5 % Place 1 patch onto the skin daily. Remove & Discard patch within 12 hours or as directed by MD   Yes [provider]  lisinopril (ZESTRIL) 5 MG tablet Take 7.5 mg by mouth daily.    Yes [provider]  loratadine (CLARITIN) 10 MG tablet Take 10 mg by mouth daily.   Yes [provider]  pravastatin (PRAVACHOL) 20 MG tablet Take 20 mg by mouth at bedtime.   Yes [provider]  primidone (MYSOLINE) 250 MG tablet Take 250 mg by mouth at bedtime.    Yes [provider]  primidone (MYSOLINE) 50 MG tablet Take 50-100 mg by mouth 2 (two) times a day. Give 100 mg in the morning, and 50 mg at noon. Give noon dose at pace on center days   Yes [provider]  propranolol ER (INDERAL LA) 60 MG 24 hr capsule Take 60 mg by mouth daily. For tremors and blood pressure 04/10/20  Yes [provider]  trospium (SANCTURA) 20 MG tablet Take 20 mg by mouth daily.   Yes [provider]  levothyroxine (SYNTHROID, LEVOTHROID) 100 MCG tablet Take 100 mcg by mouth daily. Patient not taking: No sig reported    [provider]  mometasone (NASONEX) 50 MCG/ACT nasal spray Place 2 sprays into the nose daily. Patient not taking: No sig reported    [provider]  Nutritional Supplements (ENSURE PLUS PO) Take 237 mLs by mouth 2 (two) times a day.    [provider]    Physical Exam: Vitals:   07/17/20 2102 07/17/20 2115 07/17/20 2130 07/17/20 2241  BP:  (!) 152/74 139/82 121/67  Pulse:  86 84 82  Resp:  16 16 18   Temp:      TempSrc:      SpO2:  98% 99% 97%  Weight: 52.4 kg     Height: 5' (1.524 m)        Vitals:   07/17/20 2102 07/17/20 2115 07/17/20 2130 07/17/20 2241  BP:  (!)  152/74 139/82 121/67  Pulse:  86 84 82  Resp:  16 16 18   Temp:      TempSrc:      SpO2:  98% 99% 97%  Weight: 52.4 kg     Height: 5' (1.524 m)         Constitutional:  Frail-appearing, oriented x2.  Generalized tremor.  Not in any apparent distress HEENT:      Head: Normocephalic and atraumatic.         Eyes: PERLA, EOMI, Conjunctivae are normal. Sclera is non-icteric.       Mouth/Throat: Mucous membranes are moist.       Neck: Supple with no signs of meningismus. Cardiovascular: Regular rate and rhythm. No murmurs, gallops, or rubs. 2+  symmetrical distal pulses are present . No JVD. No LE edema Respiratory: Respiratory effort normal .Lungs sounds clear bilaterally. No wheezes, crackles, or rhonchi.  Gastrointestinal: Soft, non tender, and non distended with positive bowel sounds.  Genitourinary: No CVA tenderness. Musculoskeletal: Nontender with normal range of motion in all extremities. No cyanosis, or erythema of extremities. Neurologic:  Face is symmetric. Moving all extremities. No gross focal neurologic deficits . Skin: Skin is warm, dry.  No rash or ulcers Psychiatric: Mood and affect are normal    Labs on Admission: I have personally reviewed following labs and imaging studies  CBC: Recent Labs  Lab 07/17/20 2109  WBC 14.9*  NEUTROABS 12.3*  HGB 14.9  HCT 43.5  MCV 88.6  PLT 184   Basic Metabolic Panel: Recent Labs  Lab 07/17/20 2109  NA 142  K 4.4  CL 107  CO2 28  GLUCOSE 135*  BUN 21  CREATININE 0.83  CALCIUM 8.3*   GFR: Estimated Creatinine Clearance: 34.3 mL/min (by C-G formula based on SCr of 0.83 mg/dL). Liver Function Tests: Recent Labs  Lab 07/17/20 2109  AST 17  ALT 9  ALKPHOS 69  BILITOT 0.8  PROT 5.6*  ALBUMIN 3.2*   No results for input(s): LIPASE, AMYLASE in the last 168 hours. No results for input(s): AMMONIA in the last 168 hours. Coagulation Profile: No results for input(s): INR, PROTIME in the last 168 hours. Cardiac  Enzymes: No results for input(s): CKTOTAL, CKMB, CKMBINDEX, TROPONINI in the last 168 hours. BNP (last 3 results) No results for input(s): PROBNP in the last 8760 hours. HbA1C: No results for input(s): HGBA1C in the last 72 hours. CBG: No results for input(s): GLUCAP in the last 168 hours. Lipid Profile: No results for input(s): CHOL, HDL, LDLCALC, TRIG, CHOLHDL, LDLDIRECT in the last 72 hours. Thyroid Function Tests: No results for input(s): TSH, T4TOTAL, FREET4, T3FREE, THYROIDAB in the last 72 hours. Anemia Panel: No results for input(s): VITAMINB12, FOLATE, FERRITIN, TIBC, IRON, RETICCTPCT in the last 72 hours. Urine analysis:    Component Value Date/Time   COLORURINE YELLOW (A) 04/01/2020 0545   APPEARANCEUR CLEAR (A) 04/01/2020 0545   APPEARANCEUR Clear 11/08/2014 0059   LABSPEC 1.016 04/01/2020 0545   LABSPEC 1.009 11/08/2014 0059   PHURINE 6.0 04/01/2020 0545   GLUCOSEU NEGATIVE 04/01/2020 0545   GLUCOSEU Negative 11/08/2014 0059   HGBUR SMALL (A) 04/01/2020 0545   BILIRUBINUR NEGATIVE 04/01/2020 0545   BILIRUBINUR Negative 11/08/2014 0059   KETONESUR NEGATIVE 04/01/2020 0545   PROTEINUR NEGATIVE 04/01/2020 0545   NITRITE NEGATIVE 04/01/2020 0545   LEUKOCYTESUR NEGATIVE 04/01/2020 0545   LEUKOCYTESUR Negative 11/08/2014 0059    Radiological Exams on Admission: CT Head Wo Contrast  Result Date: 07/17/2020 CLINICAL DATA:  Head injury, syncope, fall.  Hypertension. EXAM: CT HEAD WITHOUT CONTRAST CT CERVICAL SPINE WITHOUT CONTRAST TECHNIQUE: Multidetector CT imaging of the head and cervical spine was performed following the standard protocol without intravenous contrast. Multiplanar CT image reconstructions of the cervical spine were also generated. COMPARISON:  None. FINDINGS: CT HEAD FINDINGS Brain: Normal anatomic configuration. Parenchymal volume loss is commensurate with the patient's age. Mild periventricular white matter changes are present likely reflecting the  sequela of small vessel ischemia. Remote lacunar infarct is noted within the left subinsular white matter. No abnormal intra or extra-axial mass lesion or fluid collection. No abnormal mass effect or midline shift. No evidence of acute intracranial hemorrhage or infarct. Ventricular size is normal. Cerebellum unremarkable. Vascular: No asymmetric hyperdense vasculature  at the skull base. Skull: Intact Sinuses/Orbits: Paranasal sinuses are clear. Orbits are unremarkable. Other: Mastoid air cells and middle ear cavities are clear. CT CERVICAL SPINE FINDINGS Alignment: Mild overall straightening. There is 2-3 mm anterolisthesis of C7 upon T1, likely degenerative in nature. Skull base and vertebrae: Mild rotary subluxation at C1-2 results in moderate central canal stenosis at the skull base with abutment, of the thecal sac with mild remodeling. Skull base is otherwise unremarkable. The atlantodental interval is normal. No acute fracture of the cervical spine. Soft tissues and spinal canal: Narrowing of the a central canal at C1-2 secondary to rotary subluxation as noted above. Posterior disc osteophyte complex at C5-6 effaces the anterior canal space and abuts the thecal sac. Spinal canal is otherwise widely patent. No canal hematoma. No paraspinal fluid collection identified. Disc levels: There is diffuse intervertebral disc space narrowing and endplate remodeling throughout the cervical spine, most severe at C4-C6 in keeping with changes of mild to moderate degenerative disc disease. Multilevel uncovertebral and facet arthrosis results in multilevel neural foraminal narrowing, most severe on the right at C4-5 and C6-7. Upper chest: Unremarkable. Other: None IMPRESSION: No acute intracranial abnormality.  No calvarial fracture. No acute fracture of the cervical spine. Rotary subluxation at C1-2, possibly physiologic in combination with degenerative change, resulting in moderate central canal stenosis at this level with  abutment and mild remodeling of the thecal sac. The degree of stenosis and resultant effect upon the spinal cord at this level may be better assessed with MRI examination. Electronically Signed   By: Helyn Numbers MD   On: 07/17/2020 22:38   CT Cervical Spine Wo Contrast  Result Date: 07/17/2020 CLINICAL DATA:  Head injury, syncope, fall.  Hypertension. EXAM: CT HEAD WITHOUT CONTRAST CT CERVICAL SPINE WITHOUT CONTRAST TECHNIQUE: Multidetector CT imaging of the head and cervical spine was performed following the standard protocol without intravenous contrast. Multiplanar CT image reconstructions of the cervical spine were also generated. COMPARISON:  None. FINDINGS: CT HEAD FINDINGS Brain: Normal anatomic configuration. Parenchymal volume loss is commensurate with the patient's age. Mild periventricular white matter changes are present likely reflecting the sequela of small vessel ischemia. Remote lacunar infarct is noted within the left subinsular white matter. No abnormal intra or extra-axial mass lesion or fluid collection. No abnormal mass effect or midline shift. No evidence of acute intracranial hemorrhage or infarct. Ventricular size is normal. Cerebellum unremarkable. Vascular: No asymmetric hyperdense vasculature at the skull base. Skull: Intact Sinuses/Orbits: Paranasal sinuses are clear. Orbits are unremarkable. Other: Mastoid air cells and middle ear cavities are clear. CT CERVICAL SPINE FINDINGS Alignment: Mild overall straightening. There is 2-3 mm anterolisthesis of C7 upon T1, likely degenerative in nature. Skull base and vertebrae: Mild rotary subluxation at C1-2 results in moderate central canal stenosis at the skull base with abutment, of the thecal sac with mild remodeling. Skull base is otherwise unremarkable. The atlantodental interval is normal. No acute fracture of the cervical spine. Soft tissues and spinal canal: Narrowing of the a central canal at C1-2 secondary to rotary subluxation as  noted above. Posterior disc osteophyte complex at C5-6 effaces the anterior canal space and abuts the thecal sac. Spinal canal is otherwise widely patent. No canal hematoma. No paraspinal fluid collection identified. Disc levels: There is diffuse intervertebral disc space narrowing and endplate remodeling throughout the cervical spine, most severe at C4-C6 in keeping with changes of mild to moderate degenerative disc disease. Multilevel uncovertebral and facet arthrosis results in multilevel neural  foraminal narrowing, most severe on the right at C4-5 and C6-7. Upper chest: Unremarkable. Other: None IMPRESSION: No acute intracranial abnormality.  No calvarial fracture. No acute fracture of the cervical spine. Rotary subluxation at C1-2, possibly physiologic in combination with degenerative change, resulting in moderate central canal stenosis at this level with abutment and mild remodeling of the thecal sac. The degree of stenosis and resultant effect upon the spinal cord at this level may be better assessed with MRI examination. Electronically Signed   By: Helyn Numbers MD   On: 07/17/2020 22:38   DG Chest Portable 1 View  Result Date: 07/17/2020 CLINICAL DATA:  87 year old female with weakness. EXAM: PORTABLE CHEST 1 VIEW COMPARISON:  Chest radiograph dated 04/01/2020. FINDINGS: No focal consolidation, pleural effusion, pneumothorax. The cardiac silhouette is within limits. No acute osseous pathology. IMPRESSION: No active disease. Electronically Signed   By: Elgie Collard M.D.   On: 07/17/2020 21:41     Assessment/Plan 87 year old female with history of dementia, hypothyroidism, hypertension, tremors, who presents to the emergency room following a fall with EMS recording SBP of 70.      Postural dizziness with presyncope   Falls x 2   Acute focal neurological deficit, dysarthria/aphasia -Patient sent to the ER for evaluation of a fall and possible syncopal episode, with SBP of 70.  On arrival ED  provider says patient was not speaking however at the time of my evaluation patient was answering questions appropriately -Differential includes syncope, possible orthostatic hypotension as well as stroke -Work-up to include continuous cardiac monitoring to evaluate for arrhythmias, echocardiogram, carotid Doppler -Head CT showed no acute intracranial finding -Follow-up MRI ordered from the emergency room-negative -IV hydration given reported hypotension -PT OT and speech speech consults    Abnormal CT scan, cervical spine -CT showed rotational dislocation C1 on C2 -ER provider spoke with neurosurgery, Dr. Adriana Simas who recommended c-collar and outpatient follow-up in a week but advised on getting MRI -Follow-up MRI with inpatient neurosurgery consult if acute abnormalities    Benign essential hypertension -Hold home antihypertensives for now given hypotensive episode    Hypothyroidism -Continue home meds  Tremors/Parkinson's -Continue carbidopa levodopa pending med rec    DVT prophylaxis: Lovenox  Code Status: full code  Family Communication:  none  Disposition Plan: Back to previous home environment Consults called: none  Status: Observation    Andris Baumann MD [facility]     07/18/2020, 12:55 AM

## 2020-07-18 NOTE — TOC Transition Note (Signed)
Transition of Care [facility]) - CM/SW Discharge Note   Patient Details  Name: Brandy Oconnor MRN: [id number] Date of Birth: 12/16/1930  [facility] of Care [facility]) CM/SW Contact:  Marina Goodell Phone Number:  [phone] 07/18/2020, 11:17 AM   Clinical Narrative:     CSW spoke with Windell Moment patient's daughter [phone], patient will d/c home with [facility].  CSW spoke with Baruch Goldmann [facility] [phone], who confirmed patient will continue to receive home health through [facility], does not need a PT or OT order, and [facility] will pick the patient once her ECHO results return. Attending and ED Staff notified.     Barriers to Discharge: No Barriers Identified   Patient Goals and CMS Choice        Discharge Placement                       Discharge Plan and Services                                     Social Determinants of Health (SDOH) Interventions     Readmission Risk Interventions No flowsheet data found.

## 2020-07-18 NOTE — Progress Notes (Signed)
*  PRELIMINARY RESULTS* Echocardiogram 2D Echocardiogram has been performed.  Cristela Blue 07/18/2020, 1:45 PM

## 2020-07-18 NOTE — Consult Note (Addendum)
Neurology Consult H&P  CC: syncope  History is obtained from: chart as patient has dementia.  HPI: Brandy Oconnor is a 87 y.o. female dementia, hypothyroidism, hypertension, tremors who presents to the emergency room following a fall. The patient was able to state that she lives at a home and her family has not been able to visit due to COVID. She states that she has only had 2 falls however she cannot say if there were premonitory symptoms (visual changes, lightheadedness, perioral anesthesia). She also was not able to explain how she fell.  Son reports that she may have had a syncopal episode.  On arrival EMS reportedly hypotensive with systolic of 70 (palpation).   Denies acute pain, visual changes, hearing changes, weakness, denies palpitations.  ROS: Unable to assess constitution due to dementia.  Past Medical History:  Diagnosis Date  . Dementia (HCC)   . Depression   . Fatigue   . Femur fracture, right (HCC)   . GERD (gastroesophageal reflux disease)   . Hyperlipidemia   . Hypertension   . Hypothyroidism   . Osteoarthritis   . Tremor    benign essential tremor- tremors of voice, head and jaw  . Weakness      Family History  Problem Relation Age of Onset  . Diabetes Mellitus II Mother   . Other Father        Tremors    Social History:  reports that she has never smoked. Her smokeless tobacco use includes snuff. She reports that she does not drink alcohol and does not use drugs.   Prior to Admission medications   Medication Sig Start Date End Date Taking? Authorizing Provider  acetaminophen (TYLENOL) 500 MG tablet Take 500-1,000 mg by mouth every 6 (six) hours as needed.   Yes [provider]  atenolol (TENORMIN) 50 MG tablet Take 50 mg by mouth daily.   Yes [provider]  carbidopa-levodopa (SINEMET IR) 10-100 MG tablet Take 1 tablet by mouth 2 (two) times daily.   Yes [provider]  cholecalciferol (VITAMIN D3) 25 MCG (1000 UT) tablet  Take 1,000 Units by mouth daily.   Yes [provider]  COLACE 100 MG capsule Take 100 mg by mouth every other day. 04/01/20  Yes [provider]  ketotifen (ZADITOR) 0.025 % ophthalmic solution Place 1 drop into both eyes daily.   Yes [provider]  levothyroxine (SYNTHROID) 112 MCG tablet Take 112 mcg by mouth daily. 03/07/20  Yes [provider]  lidocaine (LIDODERM) 5 % Place 1 patch onto the skin daily. Remove & Discard patch within 12 hours or as directed by MD   Yes [provider]  lisinopril (ZESTRIL) 5 MG tablet Take 7.5 mg by mouth daily.    Yes [provider]  loratadine (CLARITIN) 10 MG tablet Take 10 mg by mouth daily.   Yes [provider]  primidone (MYSOLINE) 250 MG tablet Take 250 mg by mouth at bedtime.    Yes [provider]  primidone (MYSOLINE) 50 MG tablet Take 50-100 mg by mouth 2 (two) times a day. Give 100 mg in the morning, and 50 mg at noon. Give noon dose at pace on center days   Yes [provider]  propranolol ER (INDERAL LA) 60 MG 24 hr capsule Take 60 mg by mouth daily. For tremors and blood pressure 04/10/20  Yes [provider]  trospium (SANCTURA) 20 MG tablet Take 20 mg by mouth daily.   Yes [provider]  pravastatin (PRAVACHOL) 20 MG tablet Take 20 mg by mouth at bedtime.  07/18/20 Yes [provider]  Nutritional Supplements (ENSURE PLUS PO) Take 237 mLs by mouth 2 (two) times a day.    [provider]  mometasone (NASONEX) 50 MCG/ACT nasal spray Place 2 sprays into the nose daily. Patient not taking: No sig reported  07/18/20  [provider]   Exam: Current vital signs: BP (!) 142/76   Pulse 91   Temp 97.6 F (36.4 C) (Oral)   Resp 15   Ht 5' (1.524 m)   Wt 52.4 kg   SpO2 99%   BMI 22.56 kg/m   Physical Exam  Constitutional: Appears well-developed and well-nourished.  Psych: Affect appropriate to situation Eyes: No  scleral injection HENT: No OP obstrucion Head: Normocephalic.  Cardiovascular: Normal rate and regular rhythm.  Respiratory: Effort normal and breath sounds normal to anterior ascultation GI: Soft.  No distension. There is no tenderness.  Skin: WDI  Neuro: Mental Status: Patient is awake, alert, oriented to person, situation, place but not month, year. Patient is not able to give a complete history. No signs of aphasia or neglect. Cranial Nerves: II: Visual Fields are full. Pupils are equal, round, and reactive to light. III,IV, VI: EOMI without ptosis or diploplia.  V: Facial sensation is symmetric to temperature VII: Facial movement is symmetric.  VIII: hearing is intact to voice X: Uvula elevates symmetrically XI: Shoulder shrug is symmetric. XII: tongue is midline without atrophy or fasciculations.  Motor: Tone is normal. Bulk is normal. 5/5 strength was present in all four extremities. Sensory: Sensation is symmetric to light touch and temperature in the arms and legs. Deep Tendon Reflexes: 2+ and symmetric in the biceps and patellae. Plantars: Toes are downgoing bilaterally. Cerebellar: FNF and HKS are intact bilaterally. Fine kinetic tremor head, arms and voice.  I have reviewed labs in epic and the pertinent results are: Results for Brandy Oconnor, Brandy Oconnor (MRN [id number]) as of 07/18/2020 09:43  Ref. Range 07/18/2020 06:40  HDL Cholesterol Latest Ref Range: >40 mg/dL 42  LDL (calc) Latest Ref Range: 0 - 99 mg/dL 353 (H)  Triglycerides Latest Ref Range: <150 mg/dL 299    Ref. Range 07/17/2020 21:09  Glucose Latest Ref Range: 70 - 99 mg/dL 242 (H)    Ref. Range 07/18/2020 06:40  Hemoglobin A1C Latest Ref Range: 4.8 - 5.6 % 4.9    I have reviewed the images obtained: NCT head showed no acute ischemic changes. Chronic lacunar stroke in left insula.  MRI brain did not show acute stroke, mass, hemorrhage or encephalomalacia to suggest chronic cortical infarction.  Assessment: Brandy Oconnor is a 87 y.o. female lives in home PMHx dementia, hypothyroidism, hypertension, tremors with two fall and reported hypotension and SBP 70 per EMS. Imaging did only showed one chronic lacune and which is concordant with her minimal risk factors - At time of exam, the patient was normotensive <140/90, she is not diabetic and LDL was within a reasonable range. Events most likely syncopal and related to hypotension.  Recommendation: Continue management of chronic medical conditions. Neurology remains available if there are any further questions.   Electronically signed by:  Marisue Humble, MD Page: [phone] 07/18/2020, 9:42 AM

## 2020-07-18 NOTE — Evaluation (Signed)
Physical Therapy Evaluation Patient Details Name: Brandy Oconnor MRN: [id number] DOB: [birth year] Today's Date: 07/18/2020   History of Present Illness  87 y.o. female who comes from [facility] independent living.  Patient apparently fell and/or passed out today.  She is done that twice in the last 2 days.  Pt with baseline dementia and is pleasantly confused t/o session.  Clinical Impression  Pt with baseline confusion, but was eager to participate. Pt with C-collar on t/o session, but did not c/o much pain.  She was able to do some limited ambulation with close cuing and guarding, some issues getting in/out of bed secondary to height of pt vs bed.  Pt unable to give a lot of history however is seems that she is a [facility] patient and has daughter who it available essentially 24/7.      Follow Up Recommendations  (pt is a [facility] participant, has been getting HHPT)    Equipment Recommendations  None recommended by PT    Recommendations for Other Services       Precautions / Restrictions Precautions Precautions: Fall;Cervical Precaution Comments: C-collar Restrictions Weight Bearing Restrictions: No      Mobility  Bed Mobility Overal bed mobility: Needs Assistance Bed Mobility: Supine to Sit;Sit to Supine     Supine to sit: Min assist Sit to supine: Mod assist   General bed mobility comments: pt showed good effort with getting to/from supine but did need assist with each transition    Transfers Overall transfer level: Needs assistance Equipment used: Rolling walker (2 wheeled) Transfers: Sit to/from Stand Sit to Stand: Min assist         General transfer comment: tall bed with short pt, she needed direct assist to keep weight over toes during transition, once position stabilized she needed only min assist to attain standing.  In return to sitting she did well trying to get hips up onto bed, but ultimately she needed PT to help considerably to get fully back up into the bed (a  function of her height, dementia and weakness)  Ambulation/Gait Ambulation/Gait assistance: Min assist Gait Distance (Feet): 30 Feet Assistive device: Rolling walker (2 wheeled)       General Gait Details: Pt with very slow, guarded and cautious ambulation.  Highly reliant on the walker but no LOBs with some direct assist to keep her upright, and using walker normally  Stairs            Wheelchair Mobility    Modified Rankin (Stroke Patients Only)       Balance Overall balance assessment: Needs assistance Sitting-balance support: Bilateral upper extremity supported Sitting balance-Leahy Scale: Fair     Standing balance support: Bilateral upper extremity supported Standing balance-Leahy Scale: Fair                               Pertinent Vitals/Pain Pain Assessment: No/denies pain    Home Living Family/patient expects to be discharged to:: Unsure                      Prior Function Level of Independence: Needs assistance   Gait / Transfers Assistance Needed: Pt uses walker in the home  ADL's / Homemaking Assistance Needed: lives with daughter who helps with most ADLs  Comments: PLOF per chart and limited pt report (2/2 dementia)     Hand Dominance        Extremity/Trunk Assessment   Upper  Extremity Assessment Upper Extremity Assessment: Generalized weakness    Lower Extremity Assessment Lower Extremity Assessment: Generalized weakness       Communication   Communication: No difficulties  Cognition Arousal/Alertness: Awake/alert Behavior During Therapy: WFL for tasks assessed/performed Overall Cognitive Status: History of cognitive impairments - at baseline                                 General Comments: Pt very pleasant but confused t/o PT exam      General Comments General comments (skin integrity, edema, etc.): supine BP: 152/74, standing 123/79    Exercises     Assessment/Plan    PT Assessment  Patient needs continued PT services  PT Problem List Decreased strength;Decreased range of motion;Decreased activity tolerance;Decreased balance;Decreased mobility;Decreased cognition;Decreased knowledge of use of DME;Decreased safety awareness       PT Treatment Interventions DME instruction;Gait training;Stair training;Functional mobility training;Therapeutic activities;Therapeutic exercise;Balance training;Patient/family education;Cognitive remediation    PT Goals (Current goals can be found in the Care Plan section)  Acute Rehab PT Goals Patient Stated Goal: pt wants to be able to go back home PT Goal Formulation: With patient Time For Goal Achievement: 08/01/20 Potential to Achieve Goals: Fair    Frequency Min 2X/week   Barriers to discharge        Co-evaluation               AM-PAC PT "6 Clicks" Mobility  Outcome Measure Help needed turning from your back to your side while in a flat bed without using bedrails?: A Little Help needed moving from lying on your back to sitting on the side of a flat bed without using bedrails?: A Little Help needed moving to and from a bed to a chair (including a wheelchair)?: A Lot Help needed standing up from a chair using your arms (e.g., wheelchair or bedside chair)?: A Little Help needed to walk in hospital room?: A Lot Help needed climbing 3-5 steps with a railing? : A Lot 6 Click Score: 15    End of Session Equipment Utilized During Treatment: Gait belt Activity Tolerance: Patient tolerated treatment well Patient left: in bed   PT Visit Diagnosis: Muscle weakness (generalized) (M62.81);Difficulty in walking, not elsewhere classified (R26.2);Repeated falls (R29.6)    Time: [id number] PT Time Calculation (min) (ACUTE ONLY): 25 min   Charges:   PT Evaluation $PT Eval Low Complexity: 1 Low PT Treatments $Therapeutic Activity: 8-22 mins        Malachi Pro, DPT 07/18/2020, 11:43 AM

## 2020-08-02 IMAGING — CT CT ANGIOGRAPHY ABDOMEN AND PELVIS WITH CONTRAST AND WITHOUT CONT
2 of 9 series · 12 of 46 positions shown, 14 images · IV contrast (omnipaque)
Comparison: CT abdomen and pelvis-12/17/2018

CLINICAL DATA: Lower abdominal pain. Evaluate for mesenteric
ischemia.

EXAM:
CTA ABDOMEN AND PELVIS WITH CONTRAST
TECHNIQUE: Multidetector CT imaging of the abdomen and pelvis was performed
using the standard protocol during bolus administration of
intravenous contrast. Multiplanar reconstructed images and MIPs were
obtained and reviewed to evaluate the vascular anatomy.
CONTRAST:  75mL OMNIPAQUE IOHEXOL 350 MG/ML SOLN

[Series 8: axial venous (person_name) · axial · portal-venous · 0.68mm/px · z∈[-448,-60]mm · 10 of 230 slices shown, 12 images]
[im 18/230  soft-tissue]
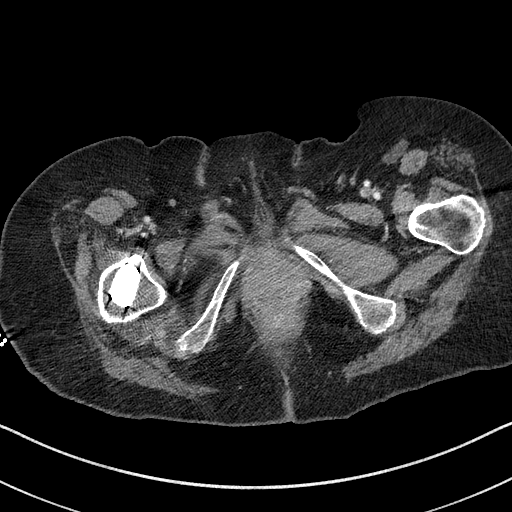
[im 18/230  bone]
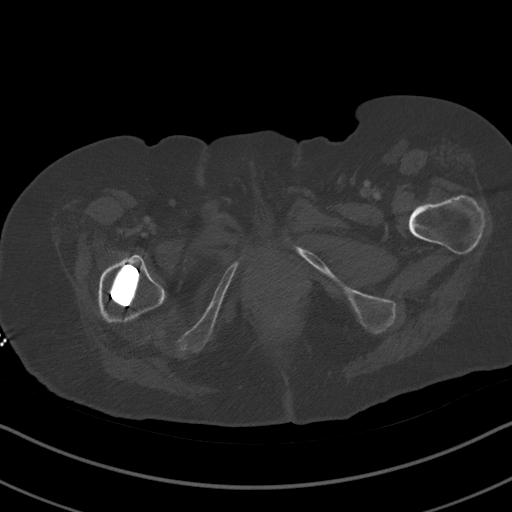
[im 36/230  soft-tissue]
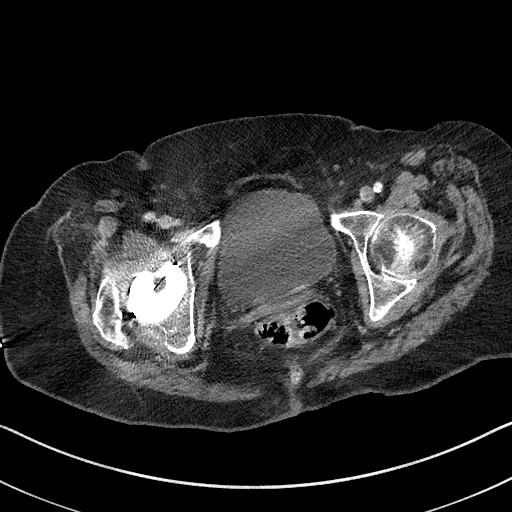
[im 71/230  soft-tissue]
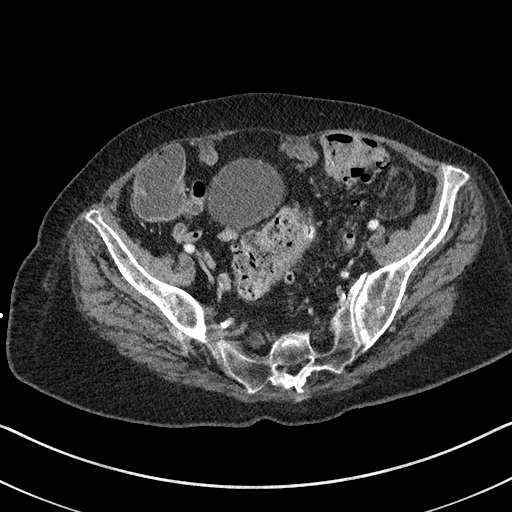
[im 89/230  soft-tissue]
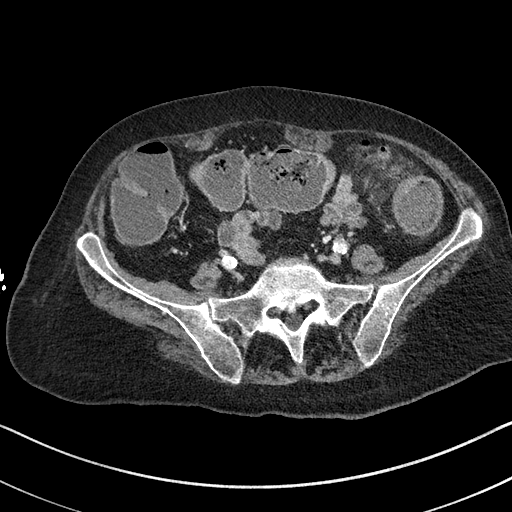
[im 106/230  soft-tissue]
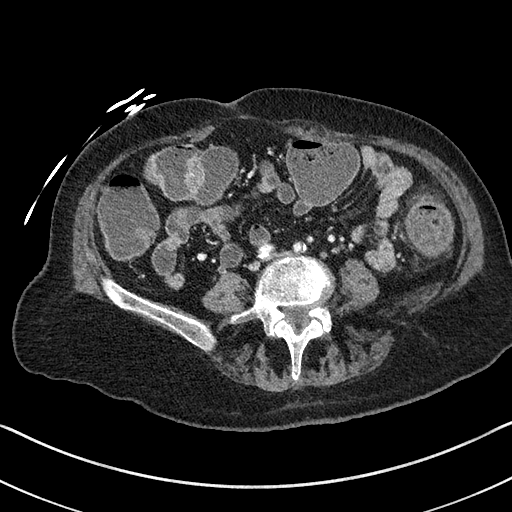
[im 124/230  soft-tissue]
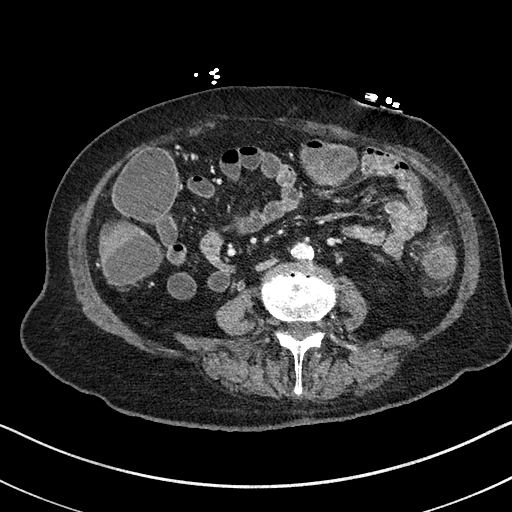
[im 141/230  soft-tissue]
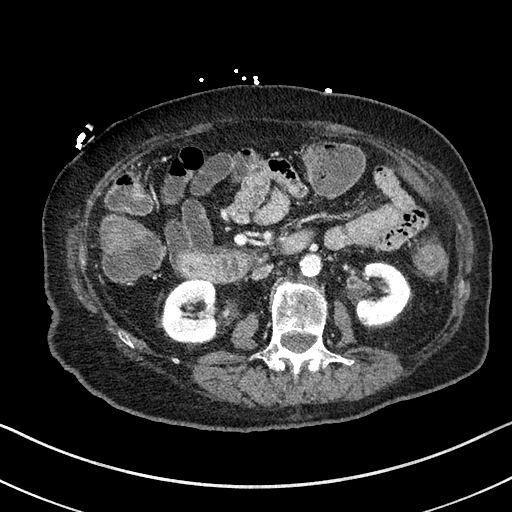
[im 177/230  soft-tissue]
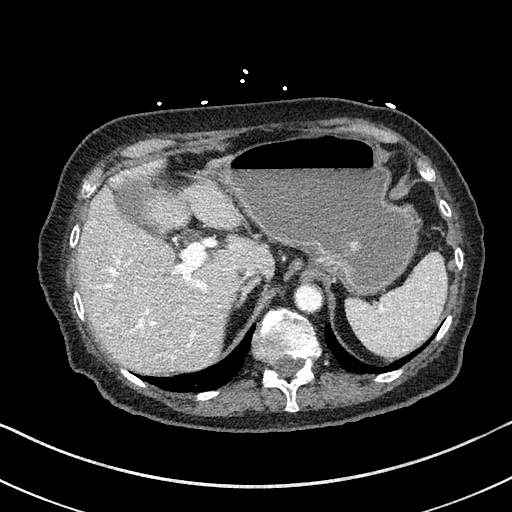
[im 194/230  soft-tissue]
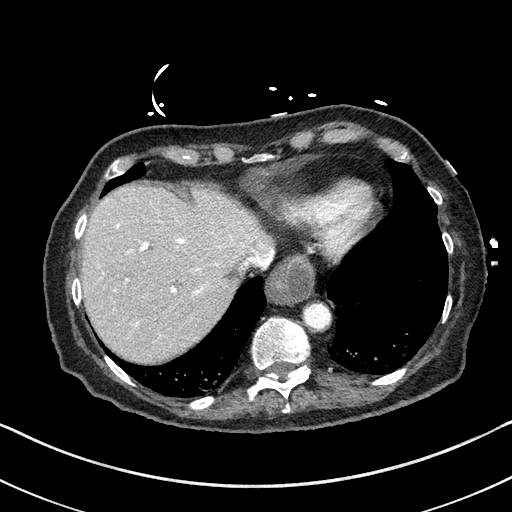
[im 194/230  bone]
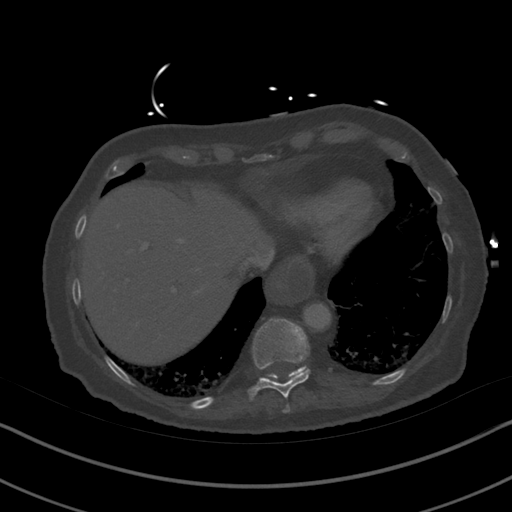
[im 212/230  soft-tissue]
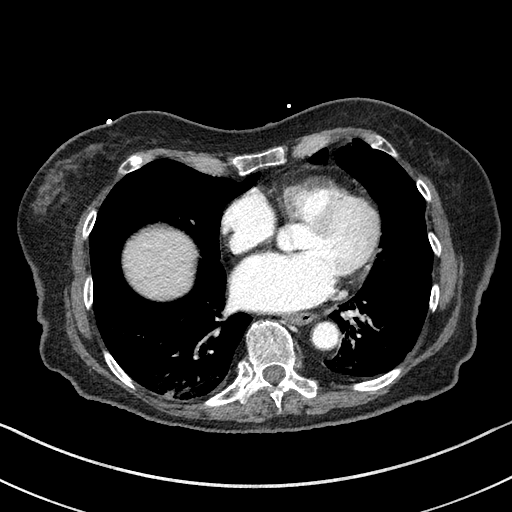

[Series 10: coronal arterial mpr · coronal · arterial · 0.76mm/px · 2 of 114 slices shown]
[im 38/114  soft-tissue]
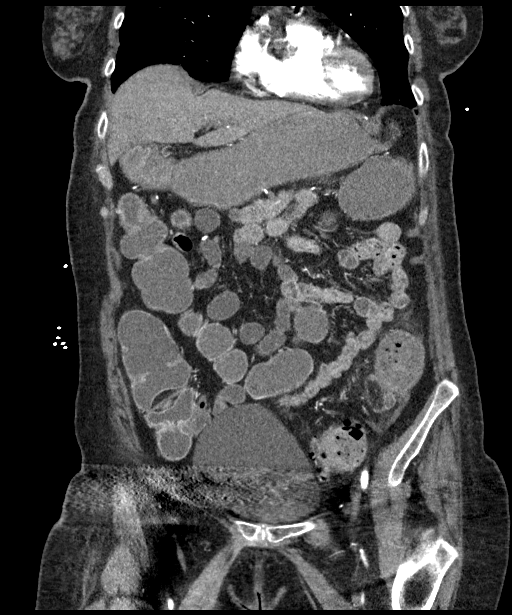
[im 76/114  soft-tissue]
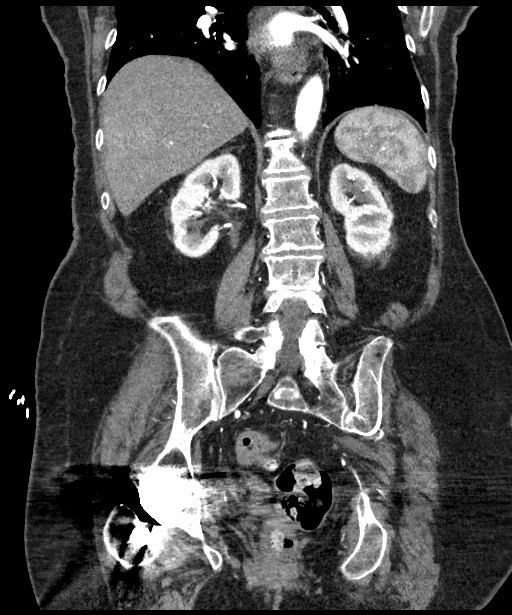

[12 of 46 positions shown; findings below may reference images not displayed]

FINDINGS: VASCULAR

Aorta: There is a moderate to large amount of eccentric irregular
mixed calcified and noncalcified atherosclerotic plaque throughout
the normal caliber abdominal aorta, not resulting in a
hemodynamically significant stenosis. No evidence of abdominal
aortic dissection or periaortic stranding on this nongated
examination.

Celiac: There is a minimal amount of eccentric mixed calcified and
noncalcified atherosclerotic plaque involving the origin the celiac
artery, not resulting in a hemodynamically significant stenosis.
Conventional branching pattern.

SMA: Widely patent without hemodynamically significant stenosis.
Conventional branching pattern. The distal tributaries of the SMA
appear widely patent without discrete intraluminal filling defect to
suggest distal embolism with special attention paid to the vascular
distribution supplying the descending colon.

Renals: Solitary bilaterally. There is a minimal amount of eccentric
mixed calcified and noncalcified atherosclerotic plaque involving
the cranial aspect of the origin of the right renal artery, not
definitely resulting in hemodynamically significant stenosis. No
vessel irregularity to suggest FMD.

IMA: Diseased at its origin though remains widely patent with early
collateral supply from the SMA.

Inflow: There is a moderate amount of eccentric mixed calcified and
noncalcified affecting the bilateral common iliac arteries, not
definitely resulting in hemodynamically significant stenosis. The
bilateral internal iliac arteries are disease though patent of
normal caliber. The bilateral external iliac arteries are widely
patent without hemodynamically significant stenosis.

Proximal Outflow: There is a moderate amount of eccentric mixed
calcified and noncalcified atherosclerotic plaque involving the
bilateral common femoral arteries, not definitely resulting in
hemodynamically significant stenosis.

The imaged aspects of the bilateral deep and superficial femoral
arteries are widely patent without hemodynamically significant
stenosis.

Veins: The IVC and pelvic venous system appear widely patent.

Review of the MIP images confirms the above findings.

_________________________________________________________

NON-VASCULAR

Lower chest: Limited visualization of the lower thorax demonstrates
minimal bibasilar ground-glass opacities, favored to represent
atelectasis. No discrete focal airspace opacities. No pleural
effusion.

Borderline cardiomegaly. Trace amount of pericardial fluid,
presumably physiologic.

Hepatobiliary: Normal hepatic contour. There is a wedge shaped
perfusional abnormality involving the subcapsular aspect of the
right lobe of the liver on the arterial phase images (image 47,
series 4), without definitive correlate on the acquired portal
venous phase images (image 52, series 8), and without correlate on
recent CT scan of the abdomen pelvis performed 12/17/2018 favored to
be artifactual due to phase of enhancement. No discrete hepatic
lesions. The hepatic portal veins appear widely patent.

Post cholecystectomy. The common bile duct is mildly dilated with
mild centralized intrahepatic biliary duct dilatation, unchanged,
and favored to be sequela of post cholecystectomy state and biliary
reservoir phenomena. No ascites.

Pancreas: Normal appearance of the pancreas.

Spleen: Normal appearance of the spleen. Note is made of a small
splenule.

Adrenals/Urinary Tract: There is symmetric enhancement of the
bilateral kidneys. Unchanged bilateral punctate hypoattenuating
renal lesions are too small to accurately characterize though
favored to represent renal cysts. No definite renal stones this
postcontrast examination.

No evidence of urinary obstruction, however there is hyperemia
involving the bilateral renal pelves and bilateral ureters. No
definitive cortical mottled enhancement to suggest pyelonephritis.
While there is no evidence of urinary obstruction, the amount
symmetric bilateral perinephric stranding has progressed compared to
the 12/17/2018 examination.

There is mild thickening of the bilateral adrenal glands without
discrete nodule.

Note is again made of a tiny (approximately 1.9 x 1.8 cm)
diverticulum arising from the left side of the mid body of the
urinary bladder (image 189, series 8).

Stomach/Bowel: Redemonstrated extensive diverticulosis, primarily
involving the sigmoid colon within the left lower abdomen/pelvis.

There is nonspecific ill-defined stranding surrounding about the
descending colon within the left pericolic gutter (representative
images 84, 99, 125 and 137, series 8), progressed compared to the
12/17/2018 examination. No evidence of perforation or
definable/drainable fluid collection.

Moderate-sized hiatal hernia. Large colonic stool burden without
definitive evidence of enteric obstruction.

Normal appearance of the terminal ileum. The appendix is not
visualized compatible provided operative history. No
pneumoperitoneum, pneumatosis or portal venous gas.

Lymphatic: No bulky retroperitoneal mesenteric, pelvic or inguinal
lymphadenopathy.

Reproductive: Post hysterectomy. No discrete adnexal lesion. No free
fluid the pelvic cul-de-sac.

Other: Minimal amount of subcutaneous edema about the midline of the
low back.

Musculoskeletal: No acute or aggressive osseous abnormalities.
Moderate to severe multilevel lumbar spine DDD, worse at L3-L4 with
disc space height loss, endplate irregularity and sclerosis. Post
right total hip replacement, incompletely evaluated but without
evidence of hardware failure or loosening.
IMPRESSION: VASCULAR

1. Moderate to large amount of atherosclerotic plaque within normal
caliber abdominal aorta, not resulting in a hemodynamically
significant stenosis. Aortic Atherosclerosis ([id number]).
2. No CT evidence of mesenteric ischemia.

NON-VASCULAR

1. Extensive diverticulosis involving the sigmoid colon within the
left lower abdomen/pelvis.
2. Nonspecific rather diffuse stranding about the descending colon
without evidence of enteric obstruction, perforation or
definable/drainable fluid collection - findings are nonspecific
though could be seen in the setting of a colitis, though conceivably
uncomplicated diverticulitis could have a similar appearance.
Clinical correlation is advised.
3. Hyperemia about the bilateral renal pelves and ureters with
associated worsening bilateral perinephric stranding but without
evidence of urinary obstruction - findings are nonspecific though
could be seen in the setting of a urinary tract infection.
Currently, there is no CT evidence of pyelonephritis. Correlation
with urinalysis is advised.
4. Moderate-sized hiatal hernia.

Critical Value/emergent results were called by telephone at the time
of interpretation on 01/11/2019 at [DATE] to Dr. Tiger who
verbally acknowledged these results.

## 2022-01-10 DEATH — deceased

## 2022-02-06 IMAGING — DX DG CHEST 1V PORT
1 series · 1 of 1 positions shown · non-contrast
Comparison: Chest radiograph dated 04/01/2020.

CLINICAL DATA: 89-year-old female with weakness.

EXAM:
PORTABLE CHEST 1 VIEW

[chest ap]
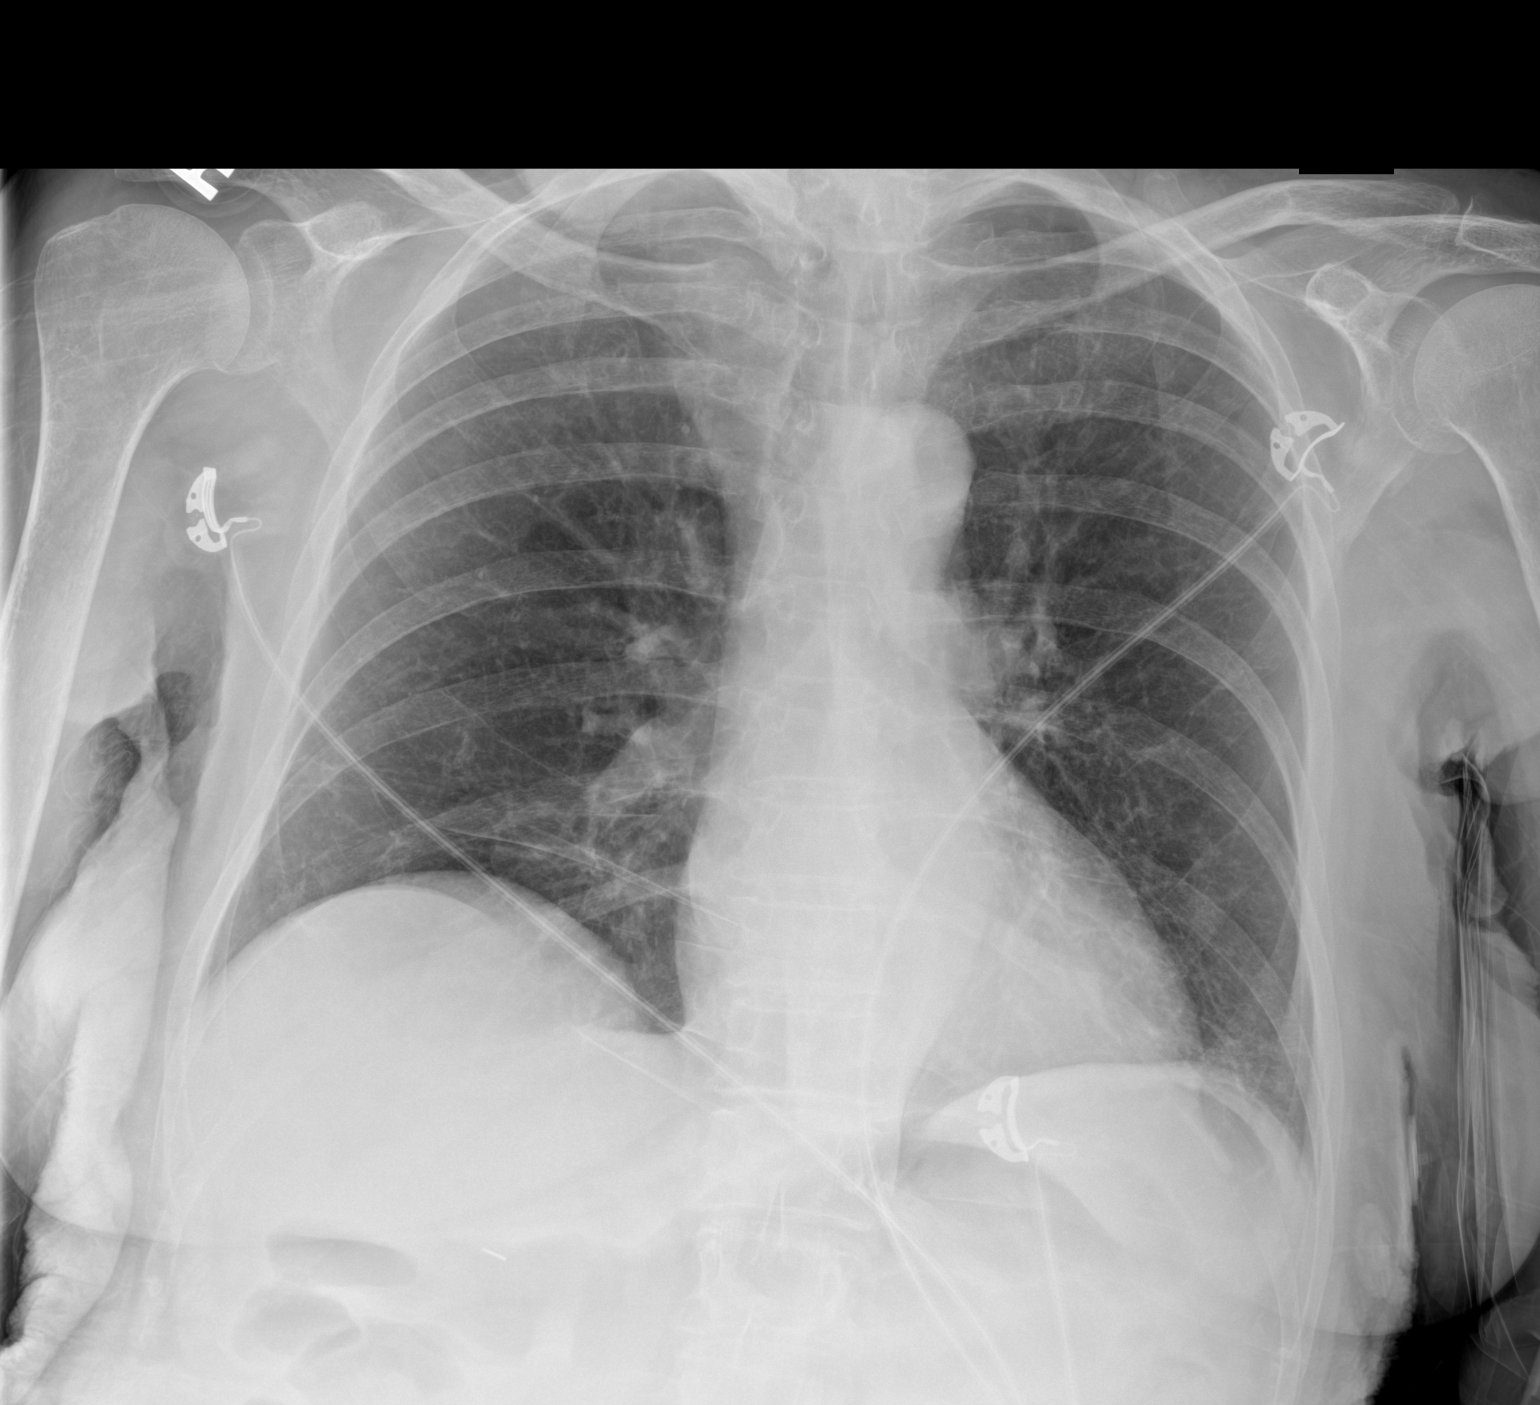

[1 of 1 positions shown; findings below may reference images not displayed]

FINDINGS: No focal consolidation, pleural effusion, pneumothorax. The cardiac
silhouette is within limits. No acute osseous pathology.
IMPRESSION: No active disease.

## 2022-02-07 IMAGING — US US CAROTID DUPLEX BILAT
1 series · 14 of 24 positions shown · non-contrast
Comparison: None.

CLINICAL DATA: Stroke

EXAM:
BILATERAL CAROTID DUPLEX ULTRASOUND
TECHNIQUE: Gray scale imaging, color Doppler and duplex ultrasound were
performed of bilateral carotid and vertebral arteries in the neck.

[Series 1: us carotid bilateral · 14 of 64 slices shown]
[im 1/64]
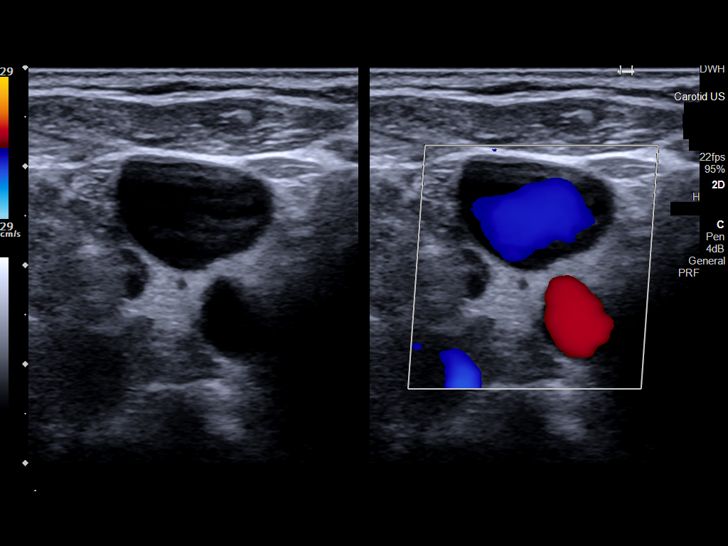
[im 6/64]
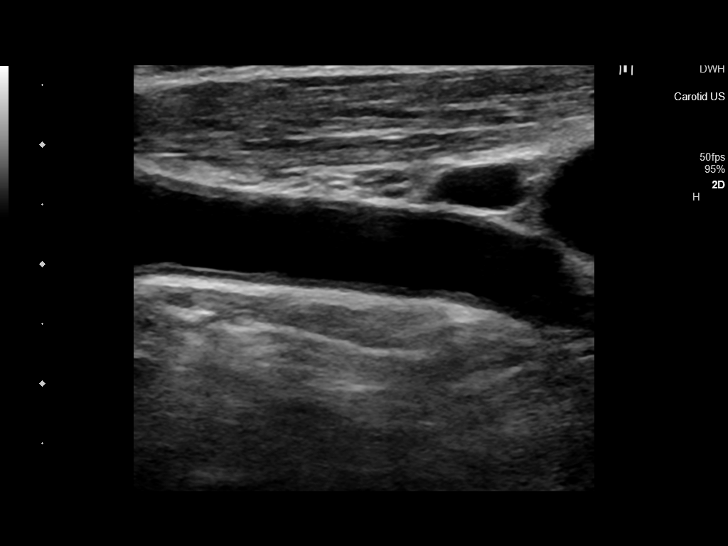
[im 11/64]
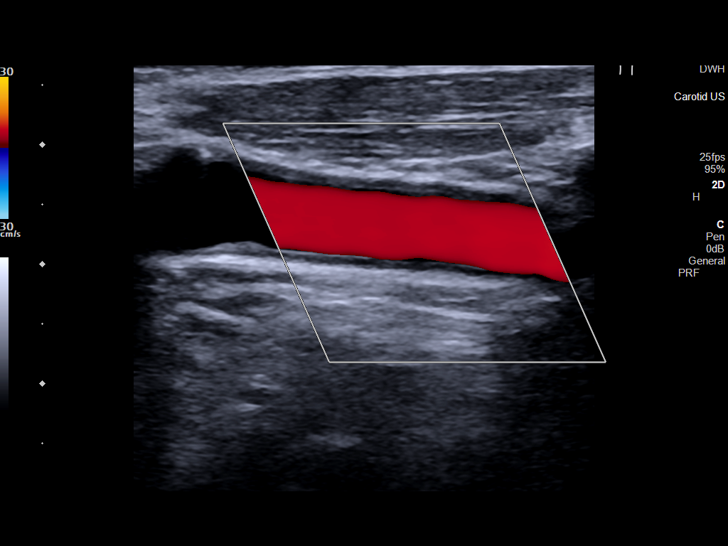
[im 17/64]
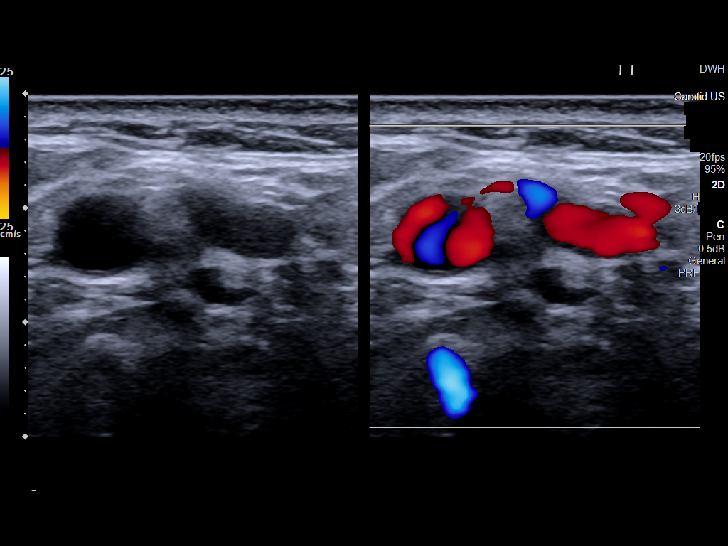
[im 20/64]
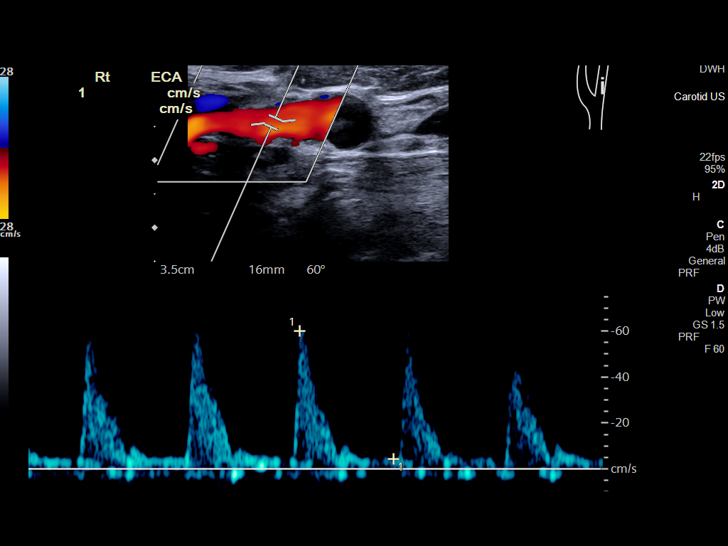
[im 25/64]
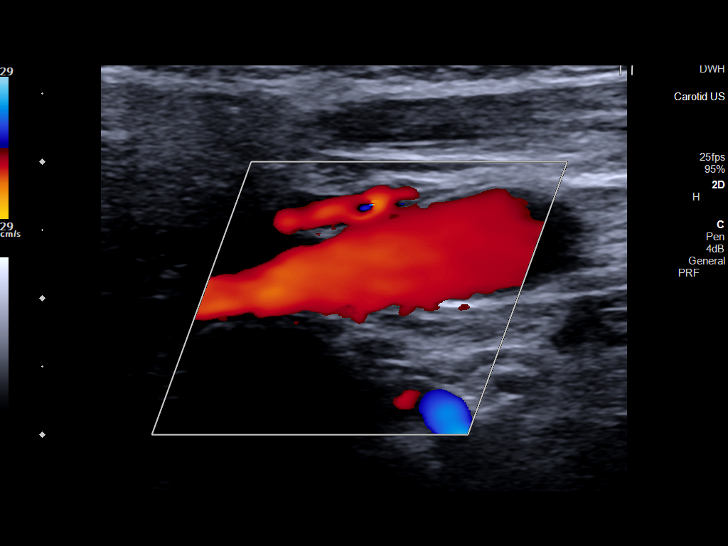
[im 31/64]
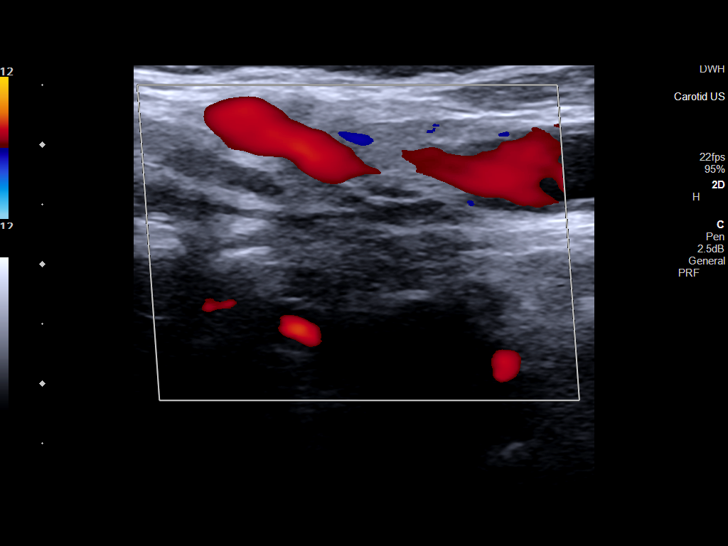
[im 33/64]
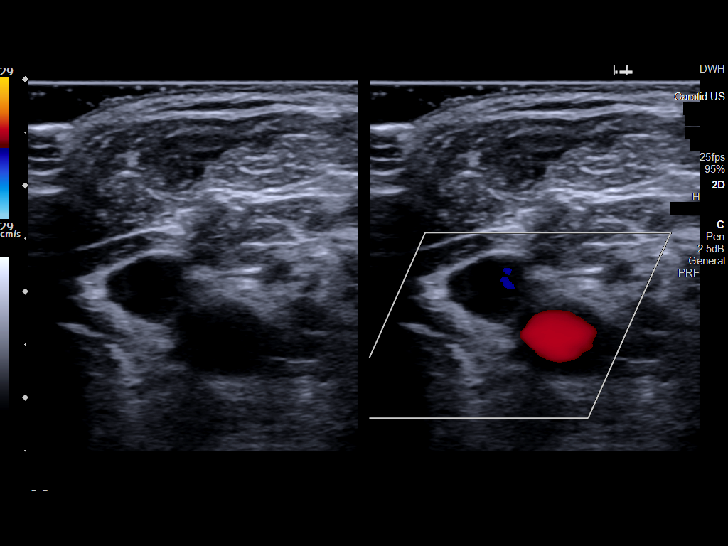
[im 39/64]
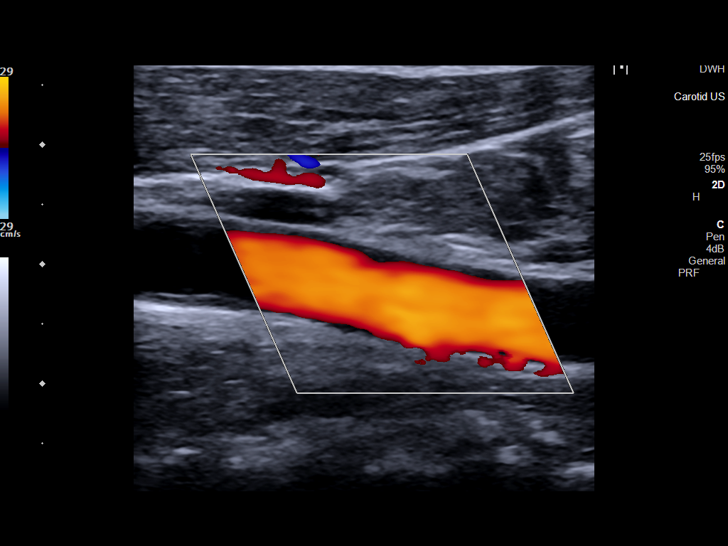
[im 44/64]
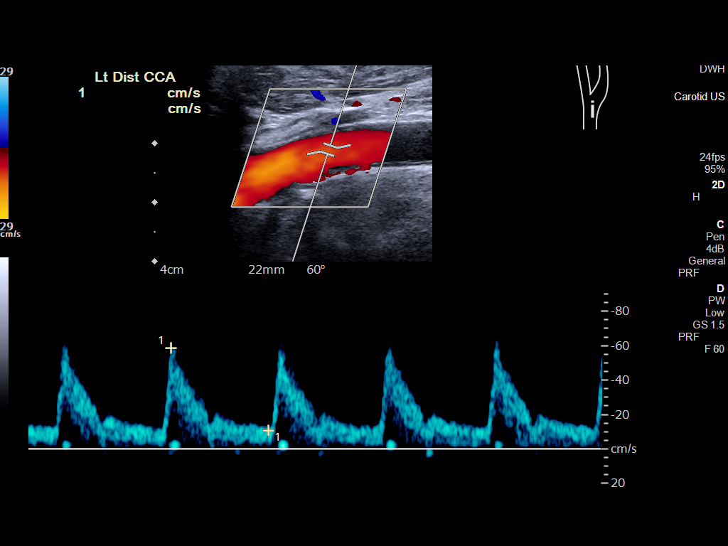
[im 50/64]
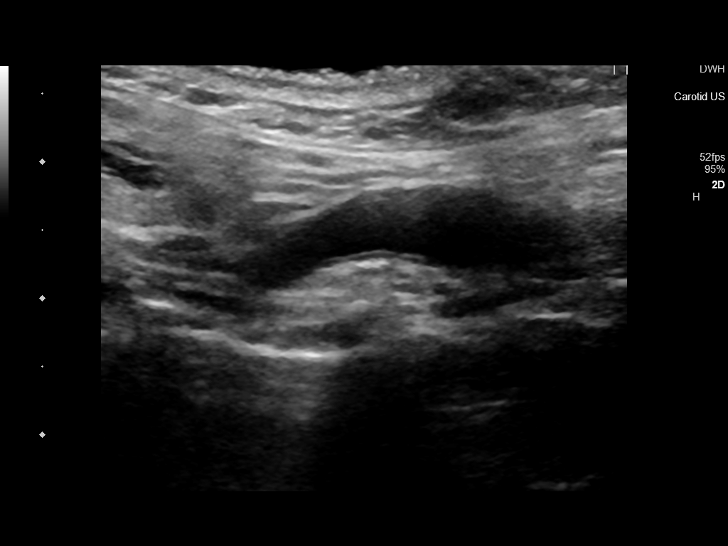
[im 53/64]
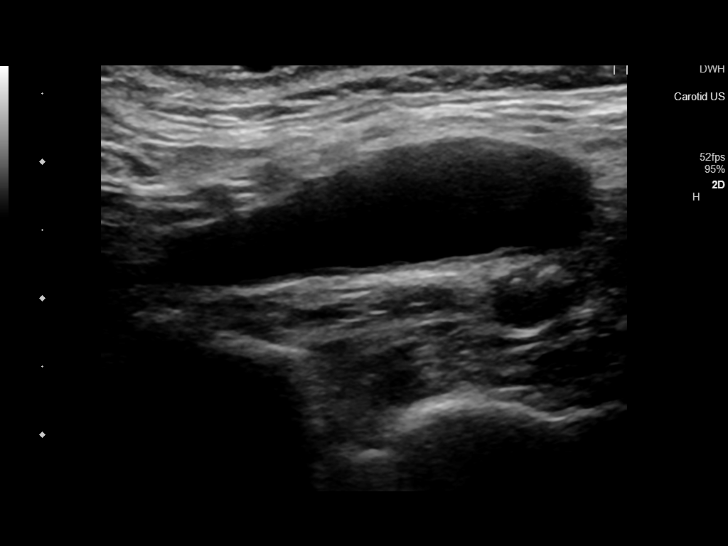
[im 58/64]
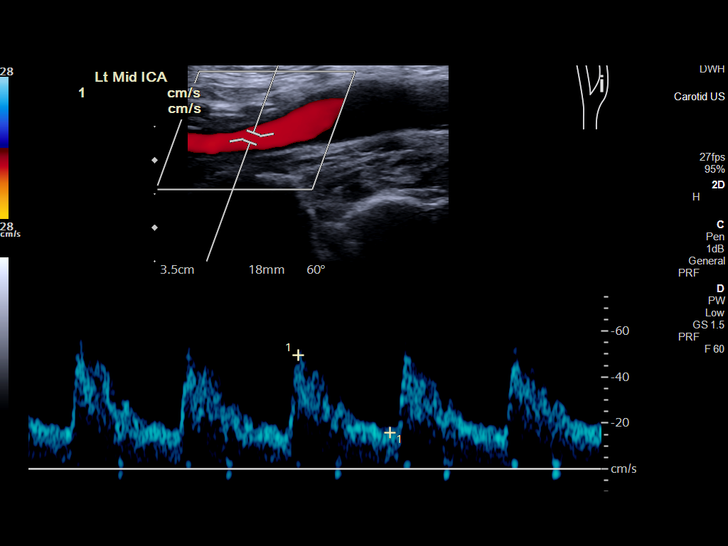
[im 64/64]
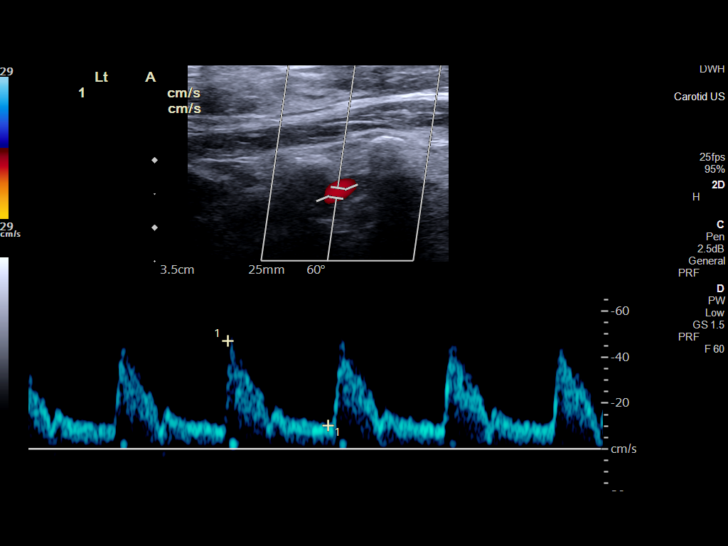

[14 of 24 positions shown; findings below may reference images not displayed]

FINDINGS: Criteria: Quantification of carotid stenosis is based on velocity
parameters that correlate the residual internal carotid diameter
with NASCET-based stenosis levels, using the diameter of the distal
internal carotid lumen as the denominator for stenosis measurement.

The following velocity measurements were obtained:

RIGHT

ICA: 82/24 cm/sec

CCA: 61/12 cm/sec

SYSTOLIC ICA/CCA RATIO:

ECA: 60 cm/sec

LEFT

ICA: 63/18 cm/sec

CCA: 58/10 cm/sec

SYSTOLIC ICA/CCA RATIO:

ECA: 79 cm/sec

RIGHT CAROTID ARTERY: Minimal atherosclerotic plaque

RIGHT VERTEBRAL ARTERY:  Antegrade flow

LEFT CAROTID ARTERY:  No significant atherosclerotic plaque

LEFT VERTEBRAL ARTERY:  Antegrade flow

Upper extremity blood pressures: RIGHT: Not measured LEFT: Not
measured
IMPRESSION: No evidence of hemodynamically significant stenosis involving the
internal carotid arteries bilaterally.

## 2022-02-07 IMAGING — CR DG ABDOMEN 1V
1 series · 1 of 1 positions shown · non-contrast
Comparison: None.

CLINICAL DATA: MRI clearance

EXAM:
ABDOMEN - 1 VIEW

[abdomen kub]
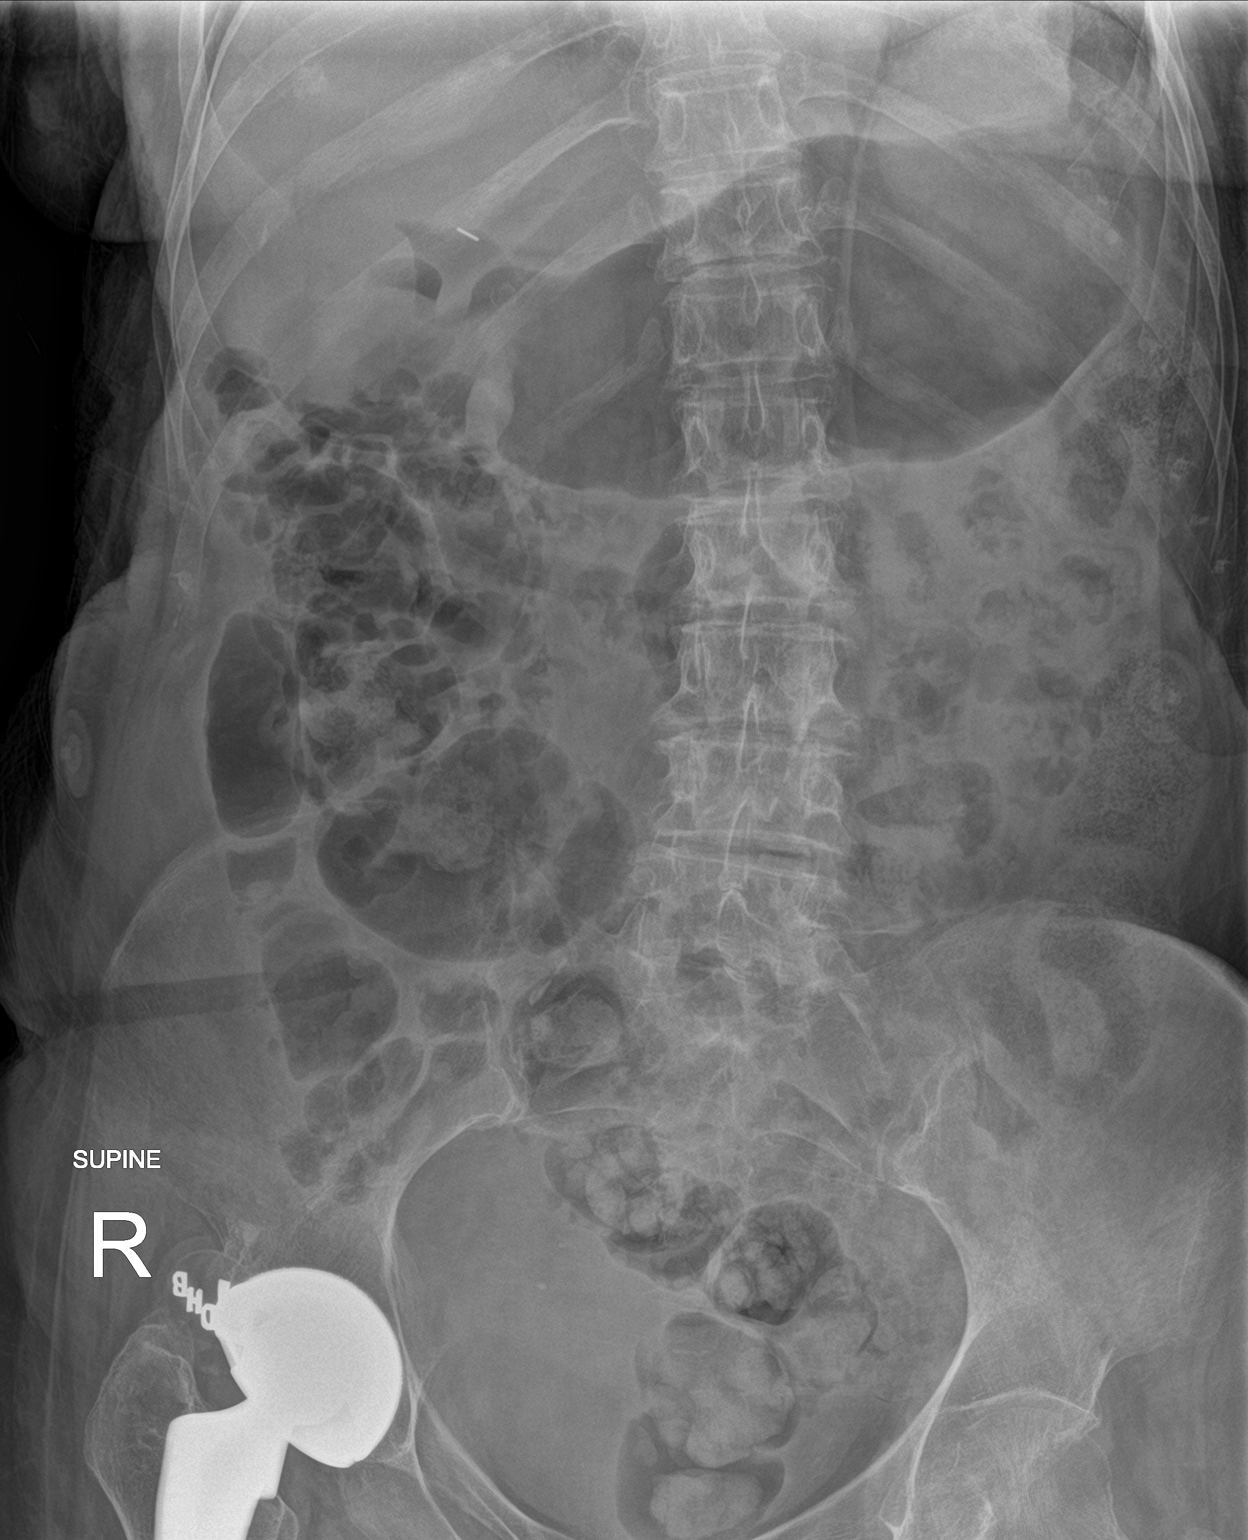

[1 of 1 positions shown; findings below may reference images not displayed]

FINDINGS: Cholecystectomy clips noted within the right upper quadrant. Normal
abdominal gas pattern. Right total hip arthroplasty has been
performed. Osseous structures are age-appropriate.
IMPRESSION: No unexpected metallic foreign body within the visualized abdomen.
